# Patient Record
Sex: Male | Born: 1980
Health system: Southern US, Community
[De-identification: ages and names within clinical notes are randomized; demographics above are authoritative.]

## PROBLEM LIST (undated history)

## (undated) DIAGNOSIS — D869 Sarcoidosis, unspecified: Secondary | ICD-10-CM

## (undated) DIAGNOSIS — G373 Acute transverse myelitis in demyelinating disease of central nervous system: Secondary | ICD-10-CM

## (undated) DIAGNOSIS — J40 Bronchitis, not specified as acute or chronic: Secondary | ICD-10-CM

## (undated) DIAGNOSIS — K219 Gastro-esophageal reflux disease without esophagitis: Secondary | ICD-10-CM

## (undated) HISTORY — DX: Acute transverse myelitis in demyelinating disease of central nervous system: G37.3

## (undated) HISTORY — PX: NO PAST SURGERIES: SHX2092

---

## 2002-12-17 ENCOUNTER — Emergency Department (HOSPITAL_COMMUNITY): Admission: EM | Admit: 2002-12-17 | Discharge: 2002-12-17 | Payer: Self-pay | Admitting: Emergency Medicine

## 2005-08-24 ENCOUNTER — Emergency Department (HOSPITAL_COMMUNITY): Admission: EM | Admit: 2005-08-24 | Discharge: 2005-08-24 | Payer: Self-pay | Admitting: Family Medicine

## 2005-10-01 ENCOUNTER — Emergency Department (HOSPITAL_COMMUNITY): Admission: EM | Admit: 2005-10-01 | Discharge: 2005-10-01 | Payer: Self-pay | Admitting: Emergency Medicine

## 2011-10-24 ENCOUNTER — Encounter: Payer: Self-pay | Admitting: Emergency Medicine

## 2011-10-24 ENCOUNTER — Emergency Department (HOSPITAL_COMMUNITY)
Admission: EM | Admit: 2011-10-24 | Discharge: 2011-10-25 | Disposition: A | Discharge status: Home / Self Care | Payer: Self-pay | Attending: Emergency Medicine | Admitting: Emergency Medicine

## 2011-10-24 DIAGNOSIS — M62838 Other muscle spasm: Secondary | ICD-10-CM | POA: Insufficient documentation

## 2011-10-24 DIAGNOSIS — R0602 Shortness of breath: Secondary | ICD-10-CM | POA: Insufficient documentation

## 2011-10-24 DIAGNOSIS — R062 Wheezing: Secondary | ICD-10-CM | POA: Insufficient documentation

## 2011-10-24 DIAGNOSIS — R51 Headache: Secondary | ICD-10-CM | POA: Insufficient documentation

## 2011-10-24 NOTE — ED Notes (Signed)
Pt states today he started having pain that shoots up the back of his head  Intermittently throughout the day  Pt states tonight he started having shortness of breath and felt gittery  Pt states now he continues to have intermittent pains in his head, the shortness of breath has improved but is having a sharp pain that runs up his left side as well

## 2011-10-25 LAB — POCT I-STAT, CHEM 8
BUN: 10 mg/dL (ref 6–23)
Calcium, Ion: 1.15 mmol/L (ref 1.12–1.32)
Chloride: 103 mEq/L (ref 96–112)
Creatinine, Ser: 1.3 mg/dL (ref 0.50–1.35)
HCT: 44 % (ref 39.0–52.0)
Potassium: 3.5 mEq/L (ref 3.5–5.1)
Sodium: 140 mEq/L (ref 135–145)
TCO2: 26 mmol/L (ref 0–100)

## 2011-10-25 MED ORDER — CYCLOBENZAPRINE HCL 10 MG PO TABS: 10.0000 mg | ORAL_TABLET | Freq: Three times a day (TID) | ORAL | Status: AC | PRN

## 2011-10-25 NOTE — ED Provider Notes (Signed)
History     CSN: [id number] Arrival date & time: 10/24/2011 11:21 PM   First MD Initiated Contact with Patient 10/25/11 0251      Chief Complaint  Patient presents with  . Headache    HPI  Hx provided by the pt.  Pt is a 30 yo male with hx of asthma who presents with complaints of intermittent rt posterior neck pains throughout the day today.  Pain is described as quick and brief sharp electrical like pains that radiate up to posterior scalp area.  Pain can come at any time and he does not reports any aggravating factors.  Pt denies any injury or strenuous activity.  Pt also reports feeling some SOB earlier and was not sure if symptoms were related.  Pt used his inhaler and reports feeling better with no SOB now.  He continues to have occasional neck pains.  Pt denies any fever, chills, sweats, sore throat, cough, CP, or N/V.  Pt has no other significant PMH.   Past Medical History  Diagnosis Date  . Asthma     History reviewed. No pertinent past surgical history.  Family History  Problem Relation Age of Onset  . Diabetes Other     History  Substance Use Topics  . Smoking status: Never Smoker   . Smokeless tobacco: Not on file  . Alcohol Use: Yes     occassional      Review of Systems  Constitutional: Negative for fever and chills.  Respiratory: Positive for shortness of breath and wheezing. Negative for cough.   Cardiovascular: Negative for chest pain.  Gastrointestinal: Negative for nausea, vomiting, abdominal pain and diarrhea.  All other systems reviewed and are negative.    Allergies  Review of patient's allergies indicates no known allergies.  Home Medications   Current Outpatient Rx  Name Route Sig Dispense Refill  . ALBUTEROL SULFATE HFA 108 (90 BASE) MCG/ACT IN AERS Inhalation Inhale 2 puffs into the lungs every 6 (six) hours as needed. For asthma     . CARBOXYMETHYLCELLULOSE SODIUM 0.5 % OP SOLN  1 drop 3 (three) times daily as needed. For eye  lubrication in both eyes     . LORATADINE-PSEUDOEPHEDRINE ER 10-240 MG PO TB24 Oral Take 1 tablet by mouth daily.      Marland Kitchen SALINE NASAL SPRAY 0.65 % NA SOLN Nasal Place 1 spray into the nose as needed. For nasal congestion in both nostrils        BP 146/88  Pulse 76  Temp(Src) 98.6 F (37 C) (Oral)  Resp 18  SpO2 100%  Physical Exam  Nursing note and vitals reviewed. Constitutional: He is oriented to person, place, and time. He appears well-developed and well-nourished. No distress.  HENT:  Head: Normocephalic.  Neck: Normal range of motion. Neck supple. No tracheal deviation present.       No crepitus, deformity.  Mild TTP over rt trapezius and paraspinal area.  Cardiovascular: Normal rate, regular rhythm and normal heart sounds.   No murmur heard. Pulmonary/Chest: Effort normal and breath sounds normal. No respiratory distress. He has no wheezes. He has no rales.  Abdominal: Soft.  Musculoskeletal: Normal range of motion. He exhibits no edema and no tenderness.  Lymphadenopathy:    He has no cervical adenopathy.  Neurological: He is alert and oriented to person, place, and time. He has normal strength. No cranial nerve deficit or sensory deficit. Coordination and gait normal.  Skin: Skin is warm. No rash noted. No erythema.  Psychiatric: He has a normal mood and affect. His behavior is normal.    ED Course  Procedures (including critical care time)  Labs Reviewed  POCT I-STAT, CHEM 8 - Abnormal; Notable for the following:    Glucose, Bld 112 (*)    All other components within normal limits  I-STAT, CHEM 8   Results for orders placed during the hospital encounter of 10/24/11  POCT I-STAT, CHEM 8      Component Value Range   Sodium 140  135 - 145 (mEq/L)   Potassium 3.5  3.5 - 5.1 (mEq/L)   Chloride 103  96 - 112 (mEq/L)   BUN 10  6 - 23 (mg/dL)   Creatinine, Ser 4.09  0.50 - 1.35 (mg/dL)   Glucose, Bld 811 (*) 70 - 99 (mg/dL)   Calcium, Ion 9.14  7.82 - 1.32 (mmol/L)     TCO2 26  0 - 100 (mmol/L)   Hemoglobin 15.0  13.0 - 17.0 (g/dL)   HCT 95.6  21.3 - 08.6 (%)      1. Muscle spasm       MDM  Pt seen and evaluated.  Pt in no acute distress. Pt is well appearing.        Angus Seller, [place] 10/25/11 [phone]

## 2011-10-25 NOTE — ED Notes (Signed)
Pt alert, c/o h/a, onset today, speech clear, ambulates to room, denies n/v or emesis

## 2011-10-26 NOTE — ED Provider Notes (Signed)
Medical screening examination/treatment/procedure(s) were performed by non-physician practitioner and as supervising physician I was immediately available for consultation/collaboration.   Vida Roller, MD 10/26/11 [phone]

## 2014-07-31 ENCOUNTER — Emergency Department (HOSPITAL_COMMUNITY)
Admission: EM | Admit: 2014-07-31 | Discharge: 2014-08-01 | Disposition: A | Discharge status: Home / Self Care | Payer: 59 | Attending: Emergency Medicine | Admitting: Emergency Medicine

## 2014-07-31 ENCOUNTER — Encounter (HOSPITAL_COMMUNITY): Payer: Self-pay | Admitting: Emergency Medicine

## 2014-07-31 DIAGNOSIS — J45909 Unspecified asthma, uncomplicated: Secondary | ICD-10-CM | POA: Insufficient documentation

## 2014-07-31 DIAGNOSIS — M5412 Radiculopathy, cervical region: Secondary | ICD-10-CM

## 2014-07-31 DIAGNOSIS — Z791 Long term (current) use of non-steroidal anti-inflammatories (NSAID): Secondary | ICD-10-CM | POA: Insufficient documentation

## 2014-07-31 DIAGNOSIS — M545 Low back pain, unspecified: Secondary | ICD-10-CM | POA: Insufficient documentation

## 2014-07-31 DIAGNOSIS — Z79899 Other long term (current) drug therapy: Secondary | ICD-10-CM | POA: Diagnosis not present

## 2014-07-31 DIAGNOSIS — R209 Unspecified disturbances of skin sensation: Secondary | ICD-10-CM | POA: Insufficient documentation

## 2014-07-31 DIAGNOSIS — G8911 Acute pain due to trauma: Secondary | ICD-10-CM | POA: Diagnosis not present

## 2014-07-31 NOTE — ED Notes (Signed)
Pt arrived to the ED with a complaint of generalized body pain with associated bilateral leg numbness.  Pt states symptoms have been present for three months.  Pt states that he slipped in the tub and was prescribed medicine but tonight the medication was ineffective and he was unable to sleep

## 2014-08-01 ENCOUNTER — Emergency Department (HOSPITAL_COMMUNITY): Payer: 59

## 2014-08-01 LAB — I-STAT CHEM 8, ED
BUN: 18 mg/dL (ref 6–23)
Calcium, Ion: 1.22 mmol/L (ref 1.12–1.23)
Chloride: 103 mEq/L (ref 96–112)
Creatinine, Ser: 1.2 mg/dL (ref 0.50–1.35)
Glucose, Bld: 111 mg/dL — ABNORMAL HIGH (ref 70–99)
HEMATOCRIT: 45 % (ref 39.0–52.0)
Hemoglobin: 15.3 g/dL (ref 13.0–17.0)
Potassium: 4.1 mEq/L (ref 3.7–5.3)
Sodium: 138 mEq/L (ref 137–147)
TCO2: 27 mmol/L (ref 0–100)

## 2014-08-01 MED ORDER — HYDROCODONE-ACETAMINOPHEN 5-325 MG PO TABS
1.0000 | ORAL_TABLET | Freq: Once | ORAL | Status: AC
  Administered 2014-08-01: 1 via ORAL
  Filled 2014-08-01: qty 1

## 2014-08-01 MED ORDER — PREDNISONE 20 MG PO TABS: ORAL_TABLET | ORAL | Status: DC

## 2014-08-01 MED ORDER — METHOCARBAMOL 500 MG PO TABS
750.0000 mg | ORAL_TABLET | Freq: Once | ORAL | Status: DC
  Filled 2014-08-01: qty 2

## 2014-08-01 MED ORDER — METHOCARBAMOL 750 MG PO TABS: 750.0000 mg | ORAL_TABLET | Freq: Two times a day (BID) | ORAL | Status: DC

## 2014-08-01 MED ORDER — HYDROCODONE-ACETAMINOPHEN 5-325 MG PO TABS: 1.0000 | ORAL_TABLET | Freq: Four times a day (QID) | ORAL | Status: DC | PRN

## 2014-08-01 MED ORDER — PREDNISONE 20 MG PO TABS
60.0000 mg | ORAL_TABLET | Freq: Once | ORAL | Status: AC
  Administered 2014-08-01: 60 mg via ORAL
  Filled 2014-08-01: qty 3

## 2014-08-01 NOTE — ED Provider Notes (Signed)
CSN: [id number]     Arrival date & time 07/31/14  2337 History   First MD Initiated Contact with Patient 07/31/14 2358     Chief Complaint  Patient presents with  . Numbness     (Consider location/radiation/quality/duration/timing/severity/associated sxs/prior Treatment) HPI Comments: Patient states, approximately 2 months ago.  He fell in the shower stepping into the bathtub.  He twisted his his head and neck against the wall, in his lower back on the edge of the tub since that time.  He has been treated for muscle spasm with anti-inflammatories, steroids as well as muscle relaxers, which seemed to be working until several days ago, when he developed numbness and tingling to his bilateral hands, and a feeling of heaviness in his legs.  He, states is not having any trouble urinating.  He is having nocturnal erections.  He, states he is having bowel movements, but feels he has to strain it is not always aware that he is passing the stool.   The history is provided by the patient.    Past Medical History  Diagnosis Date  . Asthma    History reviewed. No pertinent past surgical history. Family History  Problem Relation Age of Onset  . Diabetes Other    History  Substance Use Topics  . Smoking status: Never Smoker   . Smokeless tobacco: Not on file  . Alcohol Use: Yes     Comment: occassional    Review of Systems  Constitutional: Negative for fever.  Respiratory: Negative for shortness of breath.   Gastrointestinal: Negative for abdominal pain.  Genitourinary: Negative for urgency, frequency and difficulty urinating.  Neurological: Positive for numbness. Negative for weakness.  All other systems reviewed and are negative.     Allergies  Review of patient's allergies indicates no known allergies.  Home Medications   Prior to Admission medications   Medication Sig Start Date End Date Taking? Authorizing Provider  meloxicam (MOBIC) 15 MG tablet Take 15 mg by mouth daily.    Yes Historical Provider, MD  methocarbamol (ROBAXIN) 500 MG tablet Take 500 mg by mouth every 8 (eight) hours as needed for muscle spasms.   Yes Historical Provider, MD  albuterol (PROVENTIL HFA;VENTOLIN HFA) 108 (90 BASE) MCG/ACT inhaler Inhale 2 puffs into the lungs every 6 (six) hours as needed. For asthma     Historical Provider, MD  HYDROcodone-acetaminophen (NORCO/VICODIN) 5-325 MG per tablet Take 1 tablet by mouth every 6 (six) hours as needed for moderate pain. 08/01/14   Garald Balding, NP  methocarbamol (ROBAXIN) 750 MG tablet Take 1 tablet (750 mg total) by mouth 2 (two) times daily. 08/01/14   Garald Balding, NP  predniSONE (DELTASONE) 20 MG tablet 3 Tabs PO Days 1-3, then 2 tabs PO Days 4-6, then 1 tab PO Day 7-9, then Half Tab PO Day 10-12 08/01/14   Garald Balding, NP   BP 167/92  Pulse 77  Temp(Src) 98.7 F (37.1 C) (Oral)  Resp 16  SpO2 100% Physical Exam  Nursing note and vitals reviewed. Constitutional: He is oriented to person, place, and time. He appears well-developed and well-nourished. No distress.  HENT:  Head: Normocephalic and atraumatic.  Eyes: Pupils are equal, round, and reactive to light.  Neck: Normal range of motion. Spinous process tenderness and muscular tenderness present. No rigidity. No erythema present.  Cardiovascular: Normal rate.   Pulmonary/Chest: Effort normal.  Abdominal: Soft. Bowel sounds are normal.  Genitourinary:     Musculoskeletal: Normal range of  motion. He exhibits no edema and no tenderness.       Back:  Neurological: He is alert and oriented to person, place, and time.  Skin: Skin is warm. No rash noted. No pallor.    ED Course  Procedures (including critical care time) Labs Review Labs Reviewed - No data to display  Imaging Review Ct Cervical Spine Wo Contrast  08/01/2014   CLINICAL DATA:  General pain and leg numbness for 3 months  EXAM: CT CERVICAL SPINE WITHOUT CONTRAST  TECHNIQUE: Multidetector CT imaging of the cervical  spine was performed without intravenous contrast. Multiplanar CT image reconstructions were also generated.  COMPARISON:  None.  FINDINGS: The alignment is anatomic. The vertebral body heights are maintained. There is no acute fracture. There is no static listhesis. The prevertebral soft tissues are normal. The intraspinal soft tissues are not fully imaged on this examination due to poor soft tissue contrast, but there is no gross soft tissue abnormality.  The disc spaces are maintained. There is a mild broad-based disc bulge at C3-4 and C4-5.  The visualized portions of the lung apices demonstrate no focal abnormality.  IMPRESSION: No acute osseous injury of the cervical spine.   Electronically Signed   By: Kathreen Devoid   On: 08/01/2014 00:55   Ct Lumbar Spine Wo Contrast  08/01/2014   CLINICAL DATA:  Low back pain and numbness  EXAM: CT LUMBAR SPINE WITHOUT CONTRAST  TECHNIQUE: Multidetector CT imaging of the lumbar spine was performed without intravenous contrast administration. Multiplanar CT image reconstructions were also generated.  COMPARISON:  None.  FINDINGS: The vertebral body heights are maintained. The alignment is anatomic. The paravertebral soft tissues are normal. There is no fracture or static listhesis. There is no spondylolysis. The disc spaces are preserved. The visualized portion of the SI joints are unremarkable.  T12-L1: There is no significant disc bulge, foraminal stenosis or central canal stenosis.  L1-L2: There is no significant disc bulge, foraminal stenosis or central canal stenosis.  L2-L3: There is no significant disc bulge, foraminal stenosis or central canal stenosis.  L3-L4: There is no significant disc bulge, foraminal stenosis or central canal stenosis.  L4-L5: There is no significant disc bulge, foraminal stenosis or central canal stenosis.  L5-S1: There is no significant disc bulge, foraminal stenosis or central canal stenosis.  IMPRESSION: Normal CT of the lumbar spine.    Electronically Signed   By: Kathreen Devoid   On: 08/01/2014 01:02     EKG Interpretation None     Will schedule patient for outpatient MRI with ortho fu  MDM   Final diagnoses:  Cervical radicular pain         Garald Balding, NP 08/01/14 0240

## 2014-08-01 NOTE — ED Notes (Signed)
Patient is alert and oriented x3.  He was given DC instructions and follow up visit instructions.  Patient gave verbal understanding.  He was DC ambulatory under his own power to home.  V/S stable.  He was not showing any signs of distress on DC 

## 2014-08-01 NOTE — ED Provider Notes (Signed)
Medical screening examination/treatment/procedure(s) were performed by non-physician practitioner and as supervising physician I was immediately available for consultation/collaboration.   EKG Interpretation None         Everlene Balls, MD 08/01/14 [phone]

## 2014-08-01 NOTE — Discharge Instructions (Signed)

## 2014-08-04 ENCOUNTER — Ambulatory Visit (HOSPITAL_BASED_OUTPATIENT_CLINIC_OR_DEPARTMENT_OTHER)
Admission: RE | Admit: 2014-08-04 | Discharge: 2014-08-04 | Disposition: A | Discharge status: Home / Self Care | Payer: 59 | Source: Ambulatory Visit | Attending: Emergency Medicine | Admitting: Emergency Medicine

## 2014-08-04 ENCOUNTER — Emergency Department (HOSPITAL_COMMUNITY): Payer: 59

## 2014-08-04 ENCOUNTER — Inpatient Hospital Stay (HOSPITAL_COMMUNITY)
Admission: EM | Admit: 2014-08-04 | Discharge: 2014-08-09 | DRG: 099 | Disposition: A | Discharge status: Home / Self Care | Payer: 59 | Attending: Internal Medicine | Admitting: Internal Medicine

## 2014-08-04 ENCOUNTER — Telehealth (HOSPITAL_BASED_OUTPATIENT_CLINIC_OR_DEPARTMENT_OTHER): Payer: Self-pay | Admitting: Emergency Medicine

## 2014-08-04 ENCOUNTER — Encounter (HOSPITAL_COMMUNITY): Payer: Self-pay | Admitting: Neurology

## 2014-08-04 DIAGNOSIS — G0491 Myelitis, unspecified: Secondary | ICD-10-CM | POA: Diagnosis present

## 2014-08-04 DIAGNOSIS — R590 Localized enlarged lymph nodes: Secondary | ICD-10-CM

## 2014-08-04 DIAGNOSIS — Z79899 Other long term (current) drug therapy: Secondary | ICD-10-CM | POA: Diagnosis not present

## 2014-08-04 DIAGNOSIS — M549 Dorsalgia, unspecified: Secondary | ICD-10-CM | POA: Diagnosis present

## 2014-08-04 DIAGNOSIS — R911 Solitary pulmonary nodule: Secondary | ICD-10-CM

## 2014-08-04 DIAGNOSIS — G0490 Encephalitis and encephalomyelitis, unspecified: Secondary | ICD-10-CM

## 2014-08-04 DIAGNOSIS — J45909 Unspecified asthma, uncomplicated: Secondary | ICD-10-CM

## 2014-08-04 DIAGNOSIS — G0489 Other myelitis: Secondary | ICD-10-CM

## 2014-08-04 DIAGNOSIS — R222 Localized swelling, mass and lump, trunk: Secondary | ICD-10-CM

## 2014-08-04 DIAGNOSIS — J45998 Other asthma: Secondary | ICD-10-CM | POA: Diagnosis present

## 2014-08-04 DIAGNOSIS — K5900 Constipation, unspecified: Secondary | ICD-10-CM | POA: Diagnosis not present

## 2014-08-04 DIAGNOSIS — Z23 Encounter for immunization: Secondary | ICD-10-CM

## 2014-08-04 DIAGNOSIS — G373 Acute transverse myelitis in demyelinating disease of central nervous system: Secondary | ICD-10-CM | POA: Insufficient documentation

## 2014-08-04 LAB — CBC WITH DIFFERENTIAL/PLATELET
BASOS PCT: 0 % (ref 0–1)
Basophils Absolute: 0 10*3/uL (ref 0.0–0.1)
Eosinophils Absolute: 0.1 10*3/uL (ref 0.0–0.7)
Eosinophils Relative: 1 % (ref 0–5)
HCT: 42 % (ref 39.0–52.0)
HEMOGLOBIN: 14.9 g/dL (ref 13.0–17.0)
Lymphocytes Relative: 24 % (ref 12–46)
Lymphs Abs: 1.5 10*3/uL (ref 0.7–4.0)
MCH: 28 pg (ref 26.0–34.0)
MCHC: 35.5 g/dL (ref 30.0–36.0)
MCV: 78.9 fL (ref 78.0–100.0)
Monocytes Absolute: 0.8 10*3/uL (ref 0.1–1.0)
Monocytes Relative: 14 % — ABNORMAL HIGH (ref 3–12)
Neutro Abs: 3.6 10*3/uL (ref 1.7–7.7)
Neutrophils Relative %: 61 % (ref 43–77)
Platelets: 236 10*3/uL (ref 150–400)
RBC: 5.32 MIL/uL (ref 4.22–5.81)
RDW: 14 % (ref 11.5–15.5)
WBC: 6 10*3/uL (ref 4.0–10.5)

## 2014-08-04 LAB — COMPREHENSIVE METABOLIC PANEL
ALBUMIN: 4.4 g/dL (ref 3.5–5.2)
ALT: 29 U/L (ref 0–53)
AST: 21 U/L (ref 0–37)
Alkaline Phosphatase: 71 U/L (ref 39–117)
Anion gap: 11 (ref 5–15)
BUN: 11 mg/dL (ref 6–23)
CO2: 27 mEq/L (ref 19–32)
Calcium: 10.1 mg/dL (ref 8.4–10.5)
Chloride: 102 mEq/L (ref 96–112)
Creatinine, Ser: 1.16 mg/dL (ref 0.50–1.35)
GFR calc Af Amer: >90 mL/min (ref >90)
GFR calc non Af Amer: 82 mL/min — ABNORMAL LOW (ref >90)
Glucose, Bld: 88 mg/dL (ref 70–99)
Potassium: 4.1 mEq/L (ref 3.7–5.3)
Sodium: 140 mEq/L (ref 137–147)
TOTAL PROTEIN: 8.3 g/dL (ref 6.0–8.3)
Total Bilirubin: 0.6 mg/dL (ref 0.3–1.2)

## 2014-08-04 LAB — SEDIMENTATION RATE: SED RATE: 12 mm/h (ref 0–16)

## 2014-08-04 LAB — RAPID HIV SCREEN (WH-MAU): Rapid HIV Screen: NONREACTIVE

## 2014-08-04 MED ORDER — SODIUM CHLORIDE 0.9 % IV SOLN
500.0000 mg | Freq: Two times a day (BID) | INTRAVENOUS | Status: DC
  Administered 2014-08-04 – 2014-08-06 (×4): 500 mg via INTRAVENOUS
  Filled 2014-08-04 (×6): qty 4

## 2014-08-04 MED ORDER — GADOBENATE DIMEGLUMINE 529 MG/ML IV SOLN
20.0000 mL | Freq: Once | INTRAVENOUS | Status: AC
  Administered 2014-08-04: 20 mL via INTRAVENOUS

## 2014-08-04 MED ORDER — ONDANSETRON HCL 4 MG/2ML IJ SOLN: 4.0000 mg | Freq: Four times a day (QID) | INTRAMUSCULAR | Status: DC | PRN

## 2014-08-04 MED ORDER — ACETAMINOPHEN 325 MG PO TABS: 650.0000 mg | ORAL_TABLET | Freq: Four times a day (QID) | ORAL | Status: DC | PRN

## 2014-08-04 MED ORDER — PANTOPRAZOLE SODIUM 40 MG IV SOLR
40.0000 mg | INTRAVENOUS | Status: DC
  Administered 2014-08-04 – 2014-08-05 (×2): 40 mg via INTRAVENOUS
  Filled 2014-08-04 (×2): qty 40

## 2014-08-04 MED ORDER — ALBUTEROL SULFATE (2.5 MG/3ML) 0.083% IN NEBU: 3.0000 mL | INHALATION_SOLUTION | Freq: Four times a day (QID) | RESPIRATORY_TRACT | Status: DC | PRN

## 2014-08-04 MED ORDER — ACETAMINOPHEN 650 MG RE SUPP: 650.0000 mg | Freq: Four times a day (QID) | RECTAL | Status: DC | PRN

## 2014-08-04 MED ORDER — ONDANSETRON HCL 4 MG PO TABS: 4.0000 mg | ORAL_TABLET | Freq: Four times a day (QID) | ORAL | Status: DC | PRN

## 2014-08-04 NOTE — ED Notes (Signed)
Spoke with Admit Doctor and will need [facility] with Neuro Risk manager notified.

## 2014-08-04 NOTE — ED Provider Notes (Signed)
Patient here following up results from MRI outpatient. MRI results show diffuse edema from C2-T8, recommending further imaging by MRI and neuro consult. Patient sent to fast track for these results, I do not believe patient fits criteria for fast track at this time and we will send to main ED. Patient alert, nontoxic, no acute distress.  Signed,  Dahlia Bailiff, PA-C 6:03 PM   Carrie Mew, PA-C 08/04/14 [phone]

## 2014-08-04 NOTE — ED Notes (Signed)
[place] sandwich and apple sauce given to patient.

## 2014-08-04 NOTE — ED Notes (Signed)
Had some numbness in feet and legs and had MRI this am and was called and told to come to ER for results he thinks they found something

## 2014-08-04 NOTE — ED Notes (Signed)
Pt reports falling in the bathtub 2 months ago; had intermittent numbness in bilateral legs since then, worsening over the past week. Pt also reports tingling to third thru fifth fingers on bilateral hands. Pt had an outpatient MRI scheduled for this morning, was called and told to come to ED. Denies any new complaints since mri

## 2014-08-04 NOTE — ED Notes (Signed)
Admit Doctor at bedside.  

## 2014-08-04 NOTE — ED Notes (Signed)
Phlebotomy at bedside.

## 2014-08-04 NOTE — ED Notes (Signed)
Neurology stated patient and eat and drink if passes stroke swallow screen.

## 2014-08-04 NOTE — Consult Note (Signed)
NEURO HOSPITALIST CONSULT NOTE    Reason for Consult: abnormal Cervical MRI  HPI:                                                                                                                                          Gregory Fuller is an 33 y.o. male who fell in the tube 2 months ago.  He noted he slipped and when he landed he landed on his neck.  He did not lose consciousness. A few days after his fall he noted both back and neck pain.  He also noted when he looked down he had a electrical shock like sen sensation that went down his back.  He seeked medical attention and was given a shot of steroids which seemed to help.  A few days later his pain returned and he went to urgent care where he was give a 10 mg steroid dose pack.  This seemed to help the pain, but again when he finished the dose pack his neck pain returned.  Patient obtained a MRI of cervical spine today which demonstrated abnormal cord signal from C2-T8.   Patient denies any blurred vision, diplopia, lack of strength, bowel or bladder incontinence, shortness of breath.    Past Medical History  Diagnosis Date  . Asthma     No past surgical history on file.  Family History  Problem Relation Age of Onset  . Diabetes Other   . Thyroid disease Mother      Social History:  reports that he has never smoked. He does not have any smokeless tobacco history on file. He reports that he drinks alcohol. He reports that he does not use illicit drugs.  Allergies  Allergen Reactions  . Vicodin [Hydrocodone-Acetaminophen] Other (See Comments)    Made patient feel like he was in more pain    MEDICATIONS:                                                                                                                     Current Facility-Administered Medications  Medication Dose Route Frequency Provider Last Rate Last Dose  . methylPREDNISolone sodium succinate (SOLU-MEDROL) 500 mg in sodium chloride 0.9 % 50 mL  IVPB  500 mg Intravenous Q12H Marliss Coots, PA-C      .  pantoprazole (PROTONIX) injection 40 mg  40 mg Intravenous Q24H Marliss Coots, PA-C       Current Outpatient Prescriptions  Medication Sig Dispense Refill  . albuterol (PROVENTIL HFA;VENTOLIN HFA) 108 (90 BASE) MCG/ACT inhaler Inhale 2 puffs into the lungs every 6 (six) hours as needed. For asthma       . methocarbamol (ROBAXIN) 500 MG tablet Take 250-500 mg by mouth 3 (three) times daily as needed for muscle spasms.      Marland Kitchen sulfacetamide (BLEPH-10) 10 % ophthalmic solution Place 2 drops into the left eye 4 (four) times daily.          ROS:                                                                                                                                       History obtained from the patient  General ROS: negative for - chills, fatigue, fever, night sweats, weight gain or weight loss Psychological ROS: negative for - behavioral disorder, hallucinations, memory difficulties, mood swings or suicidal ideation Ophthalmic ROS: negative for - blurry vision, double vision, eye pain or loss of vision ENT ROS: negative for - epistaxis, nasal discharge, oral lesions, sore throat, tinnitus or vertigo Allergy and Immunology ROS: negative for - hives or itchy/watery eyes Hematological and Lymphatic ROS: negative for - bleeding problems, bruising or swollen lymph nodes Endocrine ROS: negative for - galactorrhea, hair pattern changes, polydipsia/polyuria or temperature intolerance Respiratory ROS: negative for - cough, hemoptysis, shortness of breath or wheezing Cardiovascular ROS: negative for - chest pain, dyspnea on exertion, edema or irregular heartbeat Gastrointestinal ROS: negative for - abdominal pain, diarrhea, hematemesis, nausea/vomiting or stool incontinence Genito-Urinary ROS: negative for - dysuria, hematuria, incontinence or urinary frequency/urgency Musculoskeletal ROS: negative for - joint swelling or muscular  weakness Neurological ROS: as noted in HPI Dermatological ROS: negative for rash and skin lesion changes   Blood pressure 159/93, pulse 69, temperature 98.9 F (37.2 C), temperature source Oral, resp. rate 16, SpO2 100.00%.   Neurologic Examination:                                                                                                      General: NAD Mental Status: Alert, oriented, thought content appropriate.  Speech fluent without evidence of aphasia.  Able to follow 3 step commands without difficulty. Cranial Nerves: II: Discs flat bilaterally; Visual fields grossly normal, pupils equal, round, reactive to light and accommodation III,IV, VI: ptosis not present, extra-ocular  motions intact bilaterally V,VII: smile symmetric, facial light touch sensation normal bilaterally VIII: hearing normal bilaterally IX,X: gag reflex present XI: bilateral shoulder shrug XII: midline tongue extension without atrophy or fasciculations  Motor: Right : Upper extremity   5/5    Left:     Upper extremity   5/5  Lower extremity   5/5     Lower extremity   5/5 Tone and bulk:normal tone throughout; no atrophy noted Sensory: Pinprick and light touch decreased from T5-6 to feet  Deep Tendon Reflexes:  Right: Upper Extremity   Left: Upper extremity   biceps (C-5 to C-6) 2/4   biceps (C-5 to C-6) 2/4 tricep (C7) 2/4    triceps (C7) 2/4 Brachioradialis (C6) 2/4  Brachioradialis (C6) 2/4  Lower Extremity Lower Extremity  quadriceps (L-2 to L-4) 2/4   quadriceps (L-2 to L-4) 2/4 Achilles (S1) 2/4   Achilles (S1) 2/4  Plantars: Right: downgoing   Left: downgoing Cerebellar: normal finger-to-nose,  normal heel-to-shin test Gait: not tested.  CV: pulses palpable throughout    Lab Results: Basic Metabolic Panel:  Recent Labs Lab 08/01/14 0227  NA 138  K 4.1  CL 103  GLUCOSE 111*  BUN 18  CREATININE 1.20    Liver Function Tests: No results found for this basename: AST, ALT,  ALKPHOS, BILITOT, PROT, ALBUMIN,  in the last 168 hours No results found for this basename: LIPASE, AMYLASE,  in the last 168 hours No results found for this basename: AMMONIA,  in the last 168 hours  CBC:  Recent Labs Lab 08/01/14 0227 08/04/14 1627  WBC  --  6.0  NEUTROABS  --  3.6  HGB 15.3 14.9  HCT 45.0 42.0  MCV  --  78.9  PLT  --  236    Cardiac Enzymes: No results found for this basename: CKTOTAL, CKMB, CKMBINDEX, TROPONINI,  in the last 168 hours  Lipid Panel: No results found for this basename: CHOL, TRIG, HDL, CHOLHDL, VLDL, LDLCALC,  in the last 168 hours  CBG: No results found for this basename: GLUCAP,  in the last 168 hours  Microbiology: No results found for this or any previous visit.  Coagulation Studies: No results found for this basename: LABPROT, INR,  in the last 72 hours  Imaging: Mr Cervical Spine Wo Contrast  08/04/2014   CLINICAL DATA:  Neck pain with weakness and numbness in both legs, left greater than right. Fall in bathtub July, 2015.  EXAM: MRI CERVICAL SPINE WITHOUT CONTRAST  TECHNIQUE: Multiplanar, multisequence MR imaging of the cervical spine was performed. No intravenous contrast was administered.  COMPARISON:  08/01/2014 CT scan  FINDINGS: Prominent symmetric cord expansion and edema signal extending from the C2 level down through at least the T8 level is observed, with corresponding low T1 signal in this large segment of the cord. No middle or lower upon teen abnormal signal is observed.  The craniocervical junction appears unremarkable. No vertebral subluxation is observed. No significant vertebral marrow edema is identified. Additional findings at individual levels are as follows:  C2-3:  Unremarkable.  C3-4: Mild disc bulge slightly indents the anterior cord but only because the cord is expanded.  C4-5: Mild disc bulge indents the anterior cord but only because the cord is expanded.  C5-6: Mild disc bulge indents the anterior cord but only  because the cord is expanded.  C6-7: Small central disc protrusion indents the anterior cord but only because the cord is expanded.  C7-T1:  Unremarkable.  We  obtained a limited due view of the upper thoracic spine on sagittal images which included down to T8 on T2 weighted images. Please be aware that this does not constitute a diagnostic evaluation of the thoracic spine and only gets a brief and unreliable look at the upper thoracic spine. This does appear to show the cord edema signal/expansion extending down at least to the T8 level.  IMPRESSION: 1. Prominent abnormal edema signal and expansion of the spinal cord extending from the C2 level at least down to the T8 level. Differential diagnostic considerations include HIV myelitis, postviral/post vaccination autoimmune myelitis, Devic's disease, or demyelinating process. Tumor seems unlikely given the extended involvement. Further imaging workup is recommended, to include MRI of the brain with and without contrast; MRI thoracic spine with and without contrast; and post-contrast images of the cervical spine for further characterization. Neurology consultation is also recommended. I have contacted by telephone the flow managers associated with the [facility] emergency room in order to track down and contact the patient and have the patient immediately proceed to the [facility] Emergency Room for further neurologic assessment and imaging workup.   Electronically Signed   By: Sherryl Barters M.D.   On: 08/04/2014 13:03       Assessment and plan per attending neurologist  Etta Quill PA-C [facility] [phone]  08/04/2014, 5:07 PM   Assessment/Plan: 33 year old male with myelitis of unclear etiology. Differential diagnosis includes multiple sclerosis, neuromyelitis optica, infectious myelitis, autoimmune disease, metabolic myelopathy. The longitudinally extensive nature of this   1) NMO IgG from serum 2) autoimmune evaluation  with ANA, serum ACE, SSA, SSB, ena, anca, rf 3) CSF evaluation for cell count and differential, protein, glucose, oligoclonal bands, IgG index. I would perform this after contrasted MRI brain 4) consider infectious evaluation with CSF PCR for HSV-1, HSV-2, HHV-6, VZV, CMV, EBV, enteroviruses if the patient were to spike a fever, develop meningismus, or have other signs of infectious etiology clinically or on other workup 4) HIV RNA, if immune compromised then further infectious workup 5) MRI brain with/without contrast, MRI c-spine w/contrast, MRI T-spine w/wo contrast.  6) Solumedrol 500mg  BID    Roland Rack, MD [facility] [phone]  If 7pm- 7am, please page neurology on call as listed in [place].

## 2014-08-04 NOTE — ED Notes (Signed)
Cardiology at bedside.

## 2014-08-04 NOTE — Telephone Encounter (Signed)
attempt to contact patient regarding abnormal MRI, per Radiologist, patient needs to return to [facility] ED for eval. left message to call flow managers #

## 2014-08-04 NOTE — Telephone Encounter (Signed)
patient returned call. Informed of abnormal MRI and need to come to [facility] ED today for evaluation. Patient verbalized understanding and report to [facility]  today.

## 2014-08-04 NOTE — ED Provider Notes (Signed)
CSN: [id number]     Arrival date & time 08/04/14  1433 History   First MD Initiated Contact with Patient 08/04/14 1545     Chief Complaint  Patient presents with  . Back Pain     (Consider location/radiation/quality/duration/timing/severity/associated sxs/prior Treatment) The history is provided by the patient.   patient had a fall in the bathtub about mid July with the hyperflexion of his neck and head. 2 weeks later he started numbness in his toes and that spread up within a sending type numbness now at the level of the the umbilicus. Also with numbness around his perianal area. No motor deficits at all. No visual changes no headache did have some neck pain. Patient's had courses of steroids on and off which seemed to improve somewhat he would get bad and then when he was sought done with those things would get worse again. Patient was seen at [facility] emergency department a few days ago. Patient had MRI scheduled for Monday "done to today. I was MRI of the cervical area. It showed abnormalities with swelling of the spinal cord. Patient referred in here for further evaluation. Neurology will be consult.  Past Medical History  Diagnosis Date  . Asthma    No past surgical history on file. Family History  Problem Relation Age of Onset  . Diabetes Other   . Thyroid disease Mother    History  Substance Use Topics  . Smoking status: Never Smoker   . Smokeless tobacco: Not on file  . Alcohol Use: Yes     Comment: occassional    Review of Systems  Constitutional: Negative for fever.  HENT: Negative for congestion.   Eyes: Negative for visual disturbance.  Respiratory: Negative for shortness of breath.   Cardiovascular: Negative for chest pain.  Gastrointestinal: Negative for nausea, vomiting and abdominal pain.  Genitourinary: Negative for dysuria.  Musculoskeletal: Positive for neck pain. Negative for back pain.  Skin: Negative for rash.  Neurological: Positive for numbness.  Negative for syncope, facial asymmetry, speech difficulty, weakness and headaches.  Hematological: Does not bruise/bleed easily.  Psychiatric/Behavioral: Negative for confusion.      Allergies  Vicodin  Home Medications   Prior to Admission medications   Medication Sig Start Date End Date Taking? Authorizing Provider  albuterol (PROVENTIL HFA;VENTOLIN HFA) 108 (90 BASE) MCG/ACT inhaler Inhale 2 puffs into the lungs every 6 (six) hours as needed. For asthma    Yes Historical Provider, MD  methocarbamol (ROBAXIN) 500 MG tablet Take 250-500 mg by mouth 3 (three) times daily as needed for muscle spasms.   Yes Historical Provider, MD  sulfacetamide (BLEPH-10) 10 % ophthalmic solution Place 2 drops into the left eye 4 (four) times daily.   Yes Historical Provider, MD   BP 143/87  Pulse 67  Temp(Src) 99 F (37.2 C) (Oral)  Resp 16  SpO2 99% Physical Exam  Nursing note and vitals reviewed. Constitutional: He is oriented to person, place, and time. He appears well-developed and well-nourished. No distress.  HENT:  Head: Normocephalic and atraumatic.  Mouth/Throat: Oropharynx is clear and moist.  Eyes: Conjunctivae and EOM are normal. Pupils are equal, round, and reactive to light.  Neck: Normal range of motion.  Cardiovascular: Normal rate, regular rhythm and normal heart sounds.   Pulmonary/Chest: Effort normal and breath sounds normal. No respiratory distress.  Abdominal: Soft. Bowel sounds are normal. There is no tenderness.  Musculoskeletal: Normal range of motion. He exhibits no edema.  Neurological: He is alert and  oriented to person, place, and time. No cranial nerve deficit. He exhibits normal muscle tone. Coordination normal.  Isolate her sensory deficit in the form of numbness toes up to about periumbilical area.  Skin: Skin is warm. No rash noted.    ED Course  Procedures (including critical care time) Labs Review Labs Reviewed  CBC WITH DIFFERENTIAL - Abnormal; Notable  for the following:    Monocytes Relative 14 (*)    All other components within normal limits  COMPREHENSIVE METABOLIC PANEL - Abnormal; Notable for the following:    GFR calc non Af Amer 82 (*)    All other components within normal limits  RAPID HIV SCREEN ([facility])  URINALYSIS, ROUTINE W REFLEX MICROSCOPIC  ANA  NEUROMYELITIS OPTICA AUTOAB, IGG  ANGIOTENSIN CONVERTING ENZYME  RHEUMATOID FACTOR  EXTRACTABLE NUCLEAR ANTIGEN ANTIBODY  ANCA SCREEN W REFLEX TITER  MPO/PR-3 (ANCA) ANTIBODIES  HIV-1 RNA, QUALITATIVE, TMA  SEDIMENTATION RATE  C-REACTIVE PROTEIN  VITAMIN E    Imaging Review Dg Chest 2 View  08/04/2014   CLINICAL DATA:  Abnormal C-spine MRI. Differential considerations include sarcoidosis.  EXAM: CHEST  2 VIEW  COMPARISON:  None.  FINDINGS: Lungs are essentially clear. No focal consolidation. No pleural effusion or pneumothorax.  The heart is normal in size.  Mild prominence of the bilateral pulmonary hila. Adenopathy is possible. Right paratracheal stripe is not enlarged.  Visualized osseous structures are within normal limits.  IMPRESSION: Mild prominence of the bilateral pulmonary hila, possibly reflecting hilar adenopathy, although equivocal.  If further imaging evaluation is desired, consider CT chest with contrast.  Otherwise, no evidence of acute cardiopulmonary disease.   Electronically Signed   By: Julian Hy M.D.   On: 08/04/2014 18:10   Mr Jeri Cos TG Contrast  08/04/2014   CLINICAL DATA:  Abnormal MRI of the cervical spine. Fall 2 months ago. Neck injury. Back and neck pain. Positive Lhermitte's sign.  EXAM: MRI HEAD WITHOUT AND WITH CONTRAST  MRI CERVICAL SPINE WITH CONTRAST  MRI THORACIC SPINE WITHOUT AND WITH CONTRAST  TECHNIQUE: Multiplanar, multiecho pulse sequences of the brain and surrounding structures, and cervical and thoracic spine, to include the craniocervical junction , were obtained without and with intravenous contrast.  CONTRAST:  79mL MULTIHANCE  GADOBENATE DIMEGLUMINE 529 MG/ML IV SOLN  COMPARISON:  MRI cervical spine from the same day.  FINDINGS: MRI HEAD FINDINGS  A focal area of subcortical T2 hyperintensity is present in the posterior right frontal lobe. Abnormal signal is present along the genu of the corpus callosum, left greater than right.  There is associated enhancement along the posterior genu of the corpus callosum left greater than right. No other significant focal enhancement is present.  There is subtle diffuse periventricular white matter change bilaterally without significant associated enhancement. Mild dural enhancement is present throughout, most prominent over the left tentorium.  Flow is present in the major intracranial arteries. The globes and orbits are intact. Mild mucosal thickening is present in the left frontal sinus in the anterior ethmoid air cells bilaterally. The mastoid air cells are clear.  The brain is otherwise unremarkable. No acute infarct, hemorrhage, or mass lesion is present. The ventricles are of normal size.  MRI CERVICAL SPINE FINDINGS  The postcontrast images of the cervical spine demonstrate symmetric lateral enhancement from the craniocervical junction to the C1-2 level. There is asymmetric left-sided posterior lateral dural enhancement at the level of C4. Diffuse dorsal enhancement is evident from C5 through T2. This involves both the dorsal  aspect of the cord and the dura. There is no pathologic enhancement of the vertebral bodies.  MRI THORACIC SPINE FINDINGS  There is diffuse cord signal abnormality in some expansion through the entire thoracic cord to the level of the conus medullaris which terminates at T12. Marrow signal, vertebral body heights, alignment are normal. Segment focal areas of dorsal cord in dural enhancement are present from T4 through T7 and again most notably from T9-10 through T11-12. There is some degree of diffuse dural enhancement throughout the thoracic spinal cord. Portions of the  dorsal cord enhance as well.  There is no focal disc protrusion or central canal stenosis. The foramina are widely patent bilaterally.  IMPRESSION: 1. The findings are nonspecific but suggestive of ADEM. Given the extensive segmental areas of dural enhancement, an infectious myelitis must be considered. Neuromyelitis optica is considered. Without other etiology, this would be considered idiopathic acute transverse myelitis. Multiple sclerosis is considered less likely. This does not appear to be a neoplastic process. 2. Subtle T2 changes in the brain are less prominent than in the thoracic spine and likely related to the same process. 3. Mild diffuse dural enhancement is present over the brain and spinal cord. 4. Focal enhancement is present along the up minimal surface of the left lateral ventricle as described. These results were called by telephone at the time of interpretation on 08/04/2014 at 8:51 pm to Dr. Wallie Char, who verbally acknowledged these results.   Electronically Signed   By: Lawrence Santiago M.D.   On: 08/04/2014 20:52   Mr Cervical Spine Wo Contrast  08/04/2014   CLINICAL DATA:  Neck pain with weakness and numbness in both legs, left greater than right. Fall in bathtub July, 2015.  EXAM: MRI CERVICAL SPINE WITHOUT CONTRAST  TECHNIQUE: Multiplanar, multisequence MR imaging of the cervical spine was performed. No intravenous contrast was administered.  COMPARISON:  08/01/2014 CT scan  FINDINGS: Prominent symmetric cord expansion and edema signal extending from the C2 level down through at least the T8 level is observed, with corresponding low T1 signal in this large segment of the cord. No middle or lower upon teen abnormal signal is observed.  The craniocervical junction appears unremarkable. No vertebral subluxation is observed. No significant vertebral marrow edema is identified. Additional findings at individual levels are as follows:  C2-3:  Unremarkable.  C3-4: Mild disc bulge slightly  indents the anterior cord but only because the cord is expanded.  C4-5: Mild disc bulge indents the anterior cord but only because the cord is expanded.  C5-6: Mild disc bulge indents the anterior cord but only because the cord is expanded.  C6-7: Small central disc protrusion indents the anterior cord but only because the cord is expanded.  C7-T1:  Unremarkable.  We obtained a limited due view of the upper thoracic spine on sagittal images which included down to T8 on T2 weighted images. Please be aware that this does not constitute a diagnostic evaluation of the thoracic spine and only gets a brief and unreliable look at the upper thoracic spine. This does appear to show the cord edema signal/expansion extending down at least to the T8 level.  IMPRESSION: 1. Prominent abnormal edema signal and expansion of the spinal cord extending from the C2 level at least down to the T8 level. Differential diagnostic considerations include HIV myelitis, postviral/post vaccination autoimmune myelitis, Devic's disease, or demyelinating process. Tumor seems unlikely given the extended involvement. Further imaging workup is recommended, to include MRI of the brain  with and without contrast; MRI thoracic spine with and without contrast; and post-contrast images of the cervical spine for further characterization. Neurology consultation is also recommended. I have contacted by telephone the flow managers associated with the [facility] emergency room in order to track down and contact the patient and have the patient immediately proceed to the [facility] Emergency Room for further neurologic assessment and imaging workup.   Electronically Signed   By: Sherryl Barters M.D.   On: 08/04/2014 13:03   Mr Cervical Spine W Contrast  08/04/2014   CLINICAL DATA:  Abnormal MRI of the cervical spine. Fall 2 months ago. Neck injury. Back and neck pain. Positive Lhermitte's sign.  EXAM: MRI HEAD WITHOUT AND WITH CONTRAST  MRI CERVICAL  SPINE WITH CONTRAST  MRI THORACIC SPINE WITHOUT AND WITH CONTRAST  TECHNIQUE: Multiplanar, multiecho pulse sequences of the brain and surrounding structures, and cervical and thoracic spine, to include the craniocervical junction , were obtained without and with intravenous contrast.  CONTRAST:  68mL MULTIHANCE GADOBENATE DIMEGLUMINE 529 MG/ML IV SOLN  COMPARISON:  MRI cervical spine from the same day.  FINDINGS: MRI HEAD FINDINGS  A focal area of subcortical T2 hyperintensity is present in the posterior right frontal lobe. Abnormal signal is present along the genu of the corpus callosum, left greater than right.  There is associated enhancement along the posterior genu of the corpus callosum left greater than right. No other significant focal enhancement is present.  There is subtle diffuse periventricular white matter change bilaterally without significant associated enhancement. Mild dural enhancement is present throughout, most prominent over the left tentorium.  Flow is present in the major intracranial arteries. The globes and orbits are intact. Mild mucosal thickening is present in the left frontal sinus in the anterior ethmoid air cells bilaterally. The mastoid air cells are clear.  The brain is otherwise unremarkable. No acute infarct, hemorrhage, or mass lesion is present. The ventricles are of normal size.  MRI CERVICAL SPINE FINDINGS  The postcontrast images of the cervical spine demonstrate symmetric lateral enhancement from the craniocervical junction to the C1-2 level. There is asymmetric left-sided posterior lateral dural enhancement at the level of C4. Diffuse dorsal enhancement is evident from C5 through T2. This involves both the dorsal aspect of the cord and the dura. There is no pathologic enhancement of the vertebral bodies.  MRI THORACIC SPINE FINDINGS  There is diffuse cord signal abnormality in some expansion through the entire thoracic cord to the level of the conus medullaris which  terminates at T12. Marrow signal, vertebral body heights, alignment are normal. Segment focal areas of dorsal cord in dural enhancement are present from T4 through T7 and again most notably from T9-10 through T11-12. There is some degree of diffuse dural enhancement throughout the thoracic spinal cord. Portions of the dorsal cord enhance as well.  There is no focal disc protrusion or central canal stenosis. The foramina are widely patent bilaterally.  IMPRESSION: 1. The findings are nonspecific but suggestive of ADEM. Given the extensive segmental areas of dural enhancement, an infectious myelitis must be considered. Neuromyelitis optica is considered. Without other etiology, this would be considered idiopathic acute transverse myelitis. Multiple sclerosis is considered less likely. This does not appear to be a neoplastic process. 2. Subtle T2 changes in the brain are less prominent than in the thoracic spine and likely related to the same process. 3. Mild diffuse dural enhancement is present over the brain and spinal cord. 4. Focal enhancement  is present along the up minimal surface of the left lateral ventricle as described. These results were called by telephone at the time of interpretation on 08/04/2014 at 8:51 pm to Dr. Wallie Char, who verbally acknowledged these results.   Electronically Signed   By: Lawrence Santiago M.D.   On: 08/04/2014 20:52   Mr Thoracic Spine W Wo Contrast  08/04/2014   CLINICAL DATA:  Abnormal MRI of the cervical spine. Fall 2 months ago. Neck injury. Back and neck pain. Positive Lhermitte's sign.  EXAM: MRI HEAD WITHOUT AND WITH CONTRAST  MRI CERVICAL SPINE WITH CONTRAST  MRI THORACIC SPINE WITHOUT AND WITH CONTRAST  TECHNIQUE: Multiplanar, multiecho pulse sequences of the brain and surrounding structures, and cervical and thoracic spine, to include the craniocervical junction , were obtained without and with intravenous contrast.  CONTRAST:  33mL MULTIHANCE GADOBENATE DIMEGLUMINE  529 MG/ML IV SOLN  COMPARISON:  MRI cervical spine from the same day.  FINDINGS: MRI HEAD FINDINGS  A focal area of subcortical T2 hyperintensity is present in the posterior right frontal lobe. Abnormal signal is present along the genu of the corpus callosum, left greater than right.  There is associated enhancement along the posterior genu of the corpus callosum left greater than right. No other significant focal enhancement is present.  There is subtle diffuse periventricular white matter change bilaterally without significant associated enhancement. Mild dural enhancement is present throughout, most prominent over the left tentorium.  Flow is present in the major intracranial arteries. The globes and orbits are intact. Mild mucosal thickening is present in the left frontal sinus in the anterior ethmoid air cells bilaterally. The mastoid air cells are clear.  The brain is otherwise unremarkable. No acute infarct, hemorrhage, or mass lesion is present. The ventricles are of normal size.  MRI CERVICAL SPINE FINDINGS  The postcontrast images of the cervical spine demonstrate symmetric lateral enhancement from the craniocervical junction to the C1-2 level. There is asymmetric left-sided posterior lateral dural enhancement at the level of C4. Diffuse dorsal enhancement is evident from C5 through T2. This involves both the dorsal aspect of the cord and the dura. There is no pathologic enhancement of the vertebral bodies.  MRI THORACIC SPINE FINDINGS  There is diffuse cord signal abnormality in some expansion through the entire thoracic cord to the level of the conus medullaris which terminates at T12. Marrow signal, vertebral body heights, alignment are normal. Segment focal areas of dorsal cord in dural enhancement are present from T4 through T7 and again most notably from T9-10 through T11-12. There is some degree of diffuse dural enhancement throughout the thoracic spinal cord. Portions of the dorsal cord enhance as  well.  There is no focal disc protrusion or central canal stenosis. The foramina are widely patent bilaterally.  IMPRESSION: 1. The findings are nonspecific but suggestive of ADEM. Given the extensive segmental areas of dural enhancement, an infectious myelitis must be considered. Neuromyelitis optica is considered. Without other etiology, this would be considered idiopathic acute transverse myelitis. Multiple sclerosis is considered less likely. This does not appear to be a neoplastic process. 2. Subtle T2 changes in the brain are less prominent than in the thoracic spine and likely related to the same process. 3. Mild diffuse dural enhancement is present over the brain and spinal cord. 4. Focal enhancement is present along the up minimal surface of the left lateral ventricle as described. These results were called by telephone at the time of interpretation on 08/04/2014 at 8:51 pm  to Dr. Wallie Char, who verbally acknowledged these results.   Electronically Signed   By: Lawrence Santiago M.D.   On: 08/04/2014 20:52     EKG Interpretation None      MDM   Final diagnoses:  Transverse myelitis    Neurology to see in consultation. As as well as additional MR send an order of the brain and of the thoracic vertebrae area.. Interesting findings. Patient's symptoms are predominantly sensory numbness off from the waist down but did start is in a standing type is sensory deficit starting in the toes and moving up to the waist. Symptoms started towards the end of July beginning of August patient thought it had something to do with a fall in the bathtub in the middle of July. Patient does have some difficulty starting urine stream and with defecating. No problems with incontinence. Patient does feel non-around the perianal area as well. Denies any motor deficits. Denies any visual deficits headaches a cranial nerve deficits or any upper extremity deficits.  MRI of the brain highly suggestive for her of  findings consistent with multiple sclerosis.  Fredia Sorrow, MD 08/04/14 2127

## 2014-08-04 NOTE — H&P (Signed)
[facility] History and Physical  HERSHAL ERIKSSON OFB:[id number] DOB: Sep 15, 1981 DOA: 08/04/2014  Referring physician: ER physician. PCP: No PCP Per Patient   Chief Complaint: Neck and back pain.  HPI: RONDEL EPISCOPO is a 33 y.o. male with history of bronchial asthma started experiencing neck and back pain with lower extremity numbness after he had a fall in July of this year. Patient had gone to urgent care center as outpatient when he was given steroids following which his symptoms improved but returned back after the steroid taper. Patient had gone to [facility] and over there patient was placed for outpatient MRI which showed abnormal again patient was referred to the ER. In the ER patient was evaluated by Dr. Leonel Ramsay neurologist on-call and at this time given patient's features in the MRI spine MRI brain neurologist is concerned about myelitis and patient has been started on Solu-Medrol. Patient will be admitted for further management. Patient denies any headache visual symptoms any weakness of the lower extremities or any incontinence of bowel or urine. Denies any chest pain or shortness of breath fever chills. Denies any recent travel outside the [place] or recent vaccination.   Review of Systems: As presented in the history of presenting illness, rest negative.  Past Medical History  Diagnosis Date  . Asthma    Past Surgical History  Procedure Laterality Date  . No past surgeries     Social History:  reports that he has never smoked. He does not have any smokeless tobacco history on file. He reports that he drinks alcohol. He reports that he does not use illicit drugs. Where does patient live home. Can patient participate in ADLs? Yes.  Allergies  Allergen Reactions  . Vicodin [Hydrocodone-Acetaminophen] Other (See Comments)    Made patient feel like he was in more pain    Family History:  Family History  Problem Relation Age of Onset  . Diabetes  Other   . Thyroid disease Mother       Prior to Admission medications   Medication Sig Start Date End Date Taking? Authorizing Provider  albuterol (PROVENTIL HFA;VENTOLIN HFA) 108 (90 BASE) MCG/ACT inhaler Inhale 2 puffs into the lungs every 6 (six) hours as needed. For asthma    Yes Historical Provider, MD  methocarbamol (ROBAXIN) 500 MG tablet Take 250-500 mg by mouth 3 (three) times daily as needed for muscle spasms.   Yes Historical Provider, MD  sulfacetamide (BLEPH-10) 10 % ophthalmic solution Place 2 drops into the left eye 4 (four) times daily.   Yes Historical Provider, MD    Physical Exam: Filed Vitals:   08/04/14 2100 08/04/14 2111 08/04/14 2130 08/04/14 2230  BP: 155/86  150/81 132/65  Pulse: 82  94 84  Temp:  99 F (37.2 C)    TempSrc:      Resp: 16  16 16   SpO2: 99%  100% 99%     General:  Well-developed well-nourished.  Eyes: Anicteric no pallor.  ENT: No discharge from ears eyes nose mouth.  Neck: No mass felt.  Cardiovascular: S1-S2 heard.  Respiratory: No rhonchi or crepitations.  Abdomen: Soft nontender bowel sounds present. No guarding or rigidity.  Skin: No rash.  Musculoskeletal: No edema.  Psychiatric: Appears normal.  Neurologic: Alert and oriented to time place and person. Moves all extremities 5 x 5. No facial asymmetry. Tongue is midline.  Labs on Admission:  Basic Metabolic Panel:  Recent Labs Lab 08/01/14 0227 08/04/14 1627  NA 138 140  K 4.1 4.1  CL 103 102  CO2  --  27  GLUCOSE 111* 88  BUN 18 11  CREATININE 1.20 1.16  CALCIUM  --  10.1   Liver Function Tests:  Recent Labs Lab 08/04/14 1627  AST 21  ALT 29  ALKPHOS 71  BILITOT 0.6  PROT 8.3  ALBUMIN 4.4   No results found for this basename: LIPASE, AMYLASE,  in the last 168 hours No results found for this basename: AMMONIA,  in the last 168 hours CBC:  Recent Labs Lab 08/01/14 0227 08/04/14 1627  WBC  --  6.0  NEUTROABS  --  3.6  HGB 15.3 14.9  HCT  45.0 42.0  MCV  --  78.9  PLT  --  236   Cardiac Enzymes: No results found for this basename: CKTOTAL, CKMB, CKMBINDEX, TROPONINI,  in the last 168 hours  BNP (last 3 results) No results found for this basename: PROBNP,  in the last 8760 hours CBG: No results found for this basename: GLUCAP,  in the last 168 hours  Radiological Exams on Admission: Dg Chest 2 View  08/04/2014   CLINICAL DATA:  Abnormal C-spine MRI. Differential considerations include sarcoidosis.  EXAM: CHEST  2 VIEW  COMPARISON:  None.  FINDINGS: Lungs are essentially clear. No focal consolidation. No pleural effusion or pneumothorax.  The heart is normal in size.  Mild prominence of the bilateral pulmonary hila. Adenopathy is possible. Right paratracheal stripe is not enlarged.  Visualized osseous structures are within normal limits.  IMPRESSION: Mild prominence of the bilateral pulmonary hila, possibly reflecting hilar adenopathy, although equivocal.  If further imaging evaluation is desired, consider CT chest with contrast.  Otherwise, no evidence of acute cardiopulmonary disease.   Electronically Signed   By: Julian Hy M.D.   On: 08/04/2014 18:10   Mr Jeri Cos YJ Contrast  08/04/2014   CLINICAL DATA:  Abnormal MRI of the cervical spine. Fall 2 months ago. Neck injury. Back and neck pain. Positive Lhermitte's sign.  EXAM: MRI HEAD WITHOUT AND WITH CONTRAST  MRI CERVICAL SPINE WITH CONTRAST  MRI THORACIC SPINE WITHOUT AND WITH CONTRAST  TECHNIQUE: Multiplanar, multiecho pulse sequences of the brain and surrounding structures, and cervical and thoracic spine, to include the craniocervical junction , were obtained without and with intravenous contrast.  CONTRAST:  30mL MULTIHANCE GADOBENATE DIMEGLUMINE 529 MG/ML IV SOLN  COMPARISON:  MRI cervical spine from the same day.  FINDINGS: MRI HEAD FINDINGS  A focal area of subcortical T2 hyperintensity is present in the posterior right frontal lobe. Abnormal signal is present along  the genu of the corpus callosum, left greater than right.  There is associated enhancement along the posterior genu of the corpus callosum left greater than right. No other significant focal enhancement is present.  There is subtle diffuse periventricular white matter change bilaterally without significant associated enhancement. Mild dural enhancement is present throughout, most prominent over the left tentorium.  Flow is present in the major intracranial arteries. The globes and orbits are intact. Mild mucosal thickening is present in the left frontal sinus in the anterior ethmoid air cells bilaterally. The mastoid air cells are clear.  The brain is otherwise unremarkable. No acute infarct, hemorrhage, or mass lesion is present. The ventricles are of normal size.  MRI CERVICAL SPINE FINDINGS  The postcontrast images of the cervical spine demonstrate symmetric lateral enhancement from the craniocervical junction to the C1-2 level. There is asymmetric left-sided posterior lateral dural enhancement at the  level of C4. Diffuse dorsal enhancement is evident from C5 through T2. This involves both the dorsal aspect of the cord and the dura. There is no pathologic enhancement of the vertebral bodies.  MRI THORACIC SPINE FINDINGS  There is diffuse cord signal abnormality in some expansion through the entire thoracic cord to the level of the conus medullaris which terminates at T12. Marrow signal, vertebral body heights, alignment are normal. Segment focal areas of dorsal cord in dural enhancement are present from T4 through T7 and again most notably from T9-10 through T11-12. There is some degree of diffuse dural enhancement throughout the thoracic spinal cord. Portions of the dorsal cord enhance as well.  There is no focal disc protrusion or central canal stenosis. The foramina are widely patent bilaterally.  IMPRESSION: 1. The findings are nonspecific but suggestive of ADEM. Given the extensive segmental areas of dural  enhancement, an infectious myelitis must be considered. Neuromyelitis optica is considered. Without other etiology, this would be considered idiopathic acute transverse myelitis. Multiple sclerosis is considered less likely. This does not appear to be a neoplastic process. 2. Subtle T2 changes in the brain are less prominent than in the thoracic spine and likely related to the same process. 3. Mild diffuse dural enhancement is present over the brain and spinal cord. 4. Focal enhancement is present along the up minimal surface of the left lateral ventricle as described. These results were called by telephone at the time of interpretation on 08/04/2014 at 8:51 pm to Dr. Wallie Char, who verbally acknowledged these results.   Electronically Signed   By: Lawrence Santiago M.D.   On: 08/04/2014 20:52   Mr Cervical Spine Wo Contrast  08/04/2014   CLINICAL DATA:  Neck pain with weakness and numbness in both legs, left greater than right. Fall in bathtub July, 2015.  EXAM: MRI CERVICAL SPINE WITHOUT CONTRAST  TECHNIQUE: Multiplanar, multisequence MR imaging of the cervical spine was performed. No intravenous contrast was administered.  COMPARISON:  08/01/2014 CT scan  FINDINGS: Prominent symmetric cord expansion and edema signal extending from the C2 level down through at least the T8 level is observed, with corresponding low T1 signal in this large segment of the cord. No middle or lower upon teen abnormal signal is observed.  The craniocervical junction appears unremarkable. No vertebral subluxation is observed. No significant vertebral marrow edema is identified. Additional findings at individual levels are as follows:  C2-3:  Unremarkable.  C3-4: Mild disc bulge slightly indents the anterior cord but only because the cord is expanded.  C4-5: Mild disc bulge indents the anterior cord but only because the cord is expanded.  C5-6: Mild disc bulge indents the anterior cord but only because the cord is expanded.  C6-7:  Small central disc protrusion indents the anterior cord but only because the cord is expanded.  C7-T1:  Unremarkable.  We obtained a limited due view of the upper thoracic spine on sagittal images which included down to T8 on T2 weighted images. Please be aware that this does not constitute a diagnostic evaluation of the thoracic spine and only gets a brief and unreliable look at the upper thoracic spine. This does appear to show the cord edema signal/expansion extending down at least to the T8 level.  IMPRESSION: 1. Prominent abnormal edema signal and expansion of the spinal cord extending from the C2 level at least down to the T8 level. Differential diagnostic considerations include HIV myelitis, postviral/post vaccination autoimmune myelitis, Devic's disease, or demyelinating process. Tumor  seems unlikely given the extended involvement. Further imaging workup is recommended, to include MRI of the brain with and without contrast; MRI thoracic spine with and without contrast; and post-contrast images of the cervical spine for further characterization. Neurology consultation is also recommended. I have contacted by telephone the flow managers associated with the [facility] emergency room in order to track down and contact the patient and have the patient immediately proceed to the [facility] Emergency Room for further neurologic assessment and imaging workup.   Electronically Signed   By: Sherryl Barters M.D.   On: 08/04/2014 13:03   Mr Cervical Spine W Contrast  08/04/2014   CLINICAL DATA:  Abnormal MRI of the cervical spine. Fall 2 months ago. Neck injury. Back and neck pain. Positive Lhermitte's sign.  EXAM: MRI HEAD WITHOUT AND WITH CONTRAST  MRI CERVICAL SPINE WITH CONTRAST  MRI THORACIC SPINE WITHOUT AND WITH CONTRAST  TECHNIQUE: Multiplanar, multiecho pulse sequences of the brain and surrounding structures, and cervical and thoracic spine, to include the craniocervical junction , were obtained  without and with intravenous contrast.  CONTRAST:  53mL MULTIHANCE GADOBENATE DIMEGLUMINE 529 MG/ML IV SOLN  COMPARISON:  MRI cervical spine from the same day.  FINDINGS: MRI HEAD FINDINGS  A focal area of subcortical T2 hyperintensity is present in the posterior right frontal lobe. Abnormal signal is present along the genu of the corpus callosum, left greater than right.  There is associated enhancement along the posterior genu of the corpus callosum left greater than right. No other significant focal enhancement is present.  There is subtle diffuse periventricular white matter change bilaterally without significant associated enhancement. Mild dural enhancement is present throughout, most prominent over the left tentorium.  Flow is present in the major intracranial arteries. The globes and orbits are intact. Mild mucosal thickening is present in the left frontal sinus in the anterior ethmoid air cells bilaterally. The mastoid air cells are clear.  The brain is otherwise unremarkable. No acute infarct, hemorrhage, or mass lesion is present. The ventricles are of normal size.  MRI CERVICAL SPINE FINDINGS  The postcontrast images of the cervical spine demonstrate symmetric lateral enhancement from the craniocervical junction to the C1-2 level. There is asymmetric left-sided posterior lateral dural enhancement at the level of C4. Diffuse dorsal enhancement is evident from C5 through T2. This involves both the dorsal aspect of the cord and the dura. There is no pathologic enhancement of the vertebral bodies.  MRI THORACIC SPINE FINDINGS  There is diffuse cord signal abnormality in some expansion through the entire thoracic cord to the level of the conus medullaris which terminates at T12. Marrow signal, vertebral body heights, alignment are normal. Segment focal areas of dorsal cord in dural enhancement are present from T4 through T7 and again most notably from T9-10 through T11-12. There is some degree of diffuse  dural enhancement throughout the thoracic spinal cord. Portions of the dorsal cord enhance as well.  There is no focal disc protrusion or central canal stenosis. The foramina are widely patent bilaterally.  IMPRESSION: 1. The findings are nonspecific but suggestive of ADEM. Given the extensive segmental areas of dural enhancement, an infectious myelitis must be considered. Neuromyelitis optica is considered. Without other etiology, this would be considered idiopathic acute transverse myelitis. Multiple sclerosis is considered less likely. This does not appear to be a neoplastic process. 2. Subtle T2 changes in the brain are less prominent than in the thoracic spine and likely related to the same  process. 3. Mild diffuse dural enhancement is present over the brain and spinal cord. 4. Focal enhancement is present along the up minimal surface of the left lateral ventricle as described. These results were called by telephone at the time of interpretation on 08/04/2014 at 8:51 pm to Dr. Wallie Char, who verbally acknowledged these results.   Electronically Signed   By: Lawrence Santiago M.D.   On: 08/04/2014 20:52   Mr Thoracic Spine W Wo Contrast  08/04/2014   CLINICAL DATA:  Abnormal MRI of the cervical spine. Fall 2 months ago. Neck injury. Back and neck pain. Positive Lhermitte's sign.  EXAM: MRI HEAD WITHOUT AND WITH CONTRAST  MRI CERVICAL SPINE WITH CONTRAST  MRI THORACIC SPINE WITHOUT AND WITH CONTRAST  TECHNIQUE: Multiplanar, multiecho pulse sequences of the brain and surrounding structures, and cervical and thoracic spine, to include the craniocervical junction , were obtained without and with intravenous contrast.  CONTRAST:  21mL MULTIHANCE GADOBENATE DIMEGLUMINE 529 MG/ML IV SOLN  COMPARISON:  MRI cervical spine from the same day.  FINDINGS: MRI HEAD FINDINGS  A focal area of subcortical T2 hyperintensity is present in the posterior right frontal lobe. Abnormal signal is present along the genu of the  corpus callosum, left greater than right.  There is associated enhancement along the posterior genu of the corpus callosum left greater than right. No other significant focal enhancement is present.  There is subtle diffuse periventricular white matter change bilaterally without significant associated enhancement. Mild dural enhancement is present throughout, most prominent over the left tentorium.  Flow is present in the major intracranial arteries. The globes and orbits are intact. Mild mucosal thickening is present in the left frontal sinus in the anterior ethmoid air cells bilaterally. The mastoid air cells are clear.  The brain is otherwise unremarkable. No acute infarct, hemorrhage, or mass lesion is present. The ventricles are of normal size.  MRI CERVICAL SPINE FINDINGS  The postcontrast images of the cervical spine demonstrate symmetric lateral enhancement from the craniocervical junction to the C1-2 level. There is asymmetric left-sided posterior lateral dural enhancement at the level of C4. Diffuse dorsal enhancement is evident from C5 through T2. This involves both the dorsal aspect of the cord and the dura. There is no pathologic enhancement of the vertebral bodies.  MRI THORACIC SPINE FINDINGS  There is diffuse cord signal abnormality in some expansion through the entire thoracic cord to the level of the conus medullaris which terminates at T12. Marrow signal, vertebral body heights, alignment are normal. Segment focal areas of dorsal cord in dural enhancement are present from T4 through T7 and again most notably from T9-10 through T11-12. There is some degree of diffuse dural enhancement throughout the thoracic spinal cord. Portions of the dorsal cord enhance as well.  There is no focal disc protrusion or central canal stenosis. The foramina are widely patent bilaterally.  IMPRESSION: 1. The findings are nonspecific but suggestive of ADEM. Given the extensive segmental areas of dural enhancement, an  infectious myelitis must be considered. Neuromyelitis optica is considered. Without other etiology, this would be considered idiopathic acute transverse myelitis. Multiple sclerosis is considered less likely. This does not appear to be a neoplastic process. 2. Subtle T2 changes in the brain are less prominent than in the thoracic spine and likely related to the same process. 3. Mild diffuse dural enhancement is present over the brain and spinal cord. 4. Focal enhancement is present along the up minimal surface of the left lateral ventricle as  described. These results were called by telephone at the time of interpretation on 08/04/2014 at 8:51 pm to Dr. Wallie Char, who verbally acknowledged these results.   Electronically Signed   By: Lawrence Santiago M.D.   On: 08/04/2014 20:52     Assessment/Plan Principal Problem:   Myelitis   1. Myelitis - please refer to neurologist Dr. Cecil Cobbs consult note for complete details of plan and differential diagnosis and various tests ordered. At this time patient has been started on IV Solu-Medrol 500 mg twice a day for 3 days. Closely observe with neurochecks. Further recommendations per neurologist. 2. Asthma - presently not wheezing. When necessary albuterol.    Code Status: Full code.  Family Communication: Patient's family at the bedside.  Disposition Plan: Admit to inpatient.    Kariana Wiles N. [facility] Pager [phone].  If 7PM-7AM, please contact night-coverage [email or web address] Password [email or web address] 08/04/2014, 11:11 PM

## 2014-08-05 ENCOUNTER — Inpatient Hospital Stay (HOSPITAL_COMMUNITY): Payer: 59

## 2014-08-05 DIAGNOSIS — J45909 Unspecified asthma, uncomplicated: Secondary | ICD-10-CM

## 2014-08-05 DIAGNOSIS — R599 Enlarged lymph nodes, unspecified: Secondary | ICD-10-CM

## 2014-08-05 DIAGNOSIS — G0489 Other myelitis: Secondary | ICD-10-CM

## 2014-08-05 DIAGNOSIS — R222 Localized swelling, mass and lump, trunk: Secondary | ICD-10-CM

## 2014-08-05 DIAGNOSIS — R911 Solitary pulmonary nodule: Secondary | ICD-10-CM

## 2014-08-05 DIAGNOSIS — R590 Localized enlarged lymph nodes: Secondary | ICD-10-CM

## 2014-08-05 LAB — COMPREHENSIVE METABOLIC PANEL
ALT: 25 U/L (ref 0–53)
ANION GAP: 13 (ref 5–15)
AST: 17 U/L (ref 0–37)
Albumin: 4.3 g/dL (ref 3.5–5.2)
Alkaline Phosphatase: 69 U/L (ref 39–117)
BUN: 11 mg/dL (ref 6–23)
CALCIUM: 10 mg/dL (ref 8.4–10.5)
CO2: 23 mEq/L (ref 19–32)
CREATININE: 1.04 mg/dL (ref 0.50–1.35)
Chloride: 101 mEq/L (ref 96–112)
GLUCOSE: 134 mg/dL — AB (ref 70–99)
Potassium: 4.4 mEq/L (ref 3.7–5.3)
Sodium: 137 mEq/L (ref 137–147)
TOTAL PROTEIN: 8.4 g/dL — AB (ref 6.0–8.3)
Total Bilirubin: 0.8 mg/dL (ref 0.3–1.2)

## 2014-08-05 LAB — EXTRACTABLE NUCLEAR ANTIGEN ANTIBODY
ENA SM Ab Ser-aCnc: <1
SM/RNP: NEGATIVE
SSA (Ro) (ENA) Antibody, IgG: <1
SSB (La) (ENA) Antibody, IgG: <1
Scleroderma (Scl-70) (ENA) Antibody, IgG: <1
ds DNA Ab: 1 IU/mL

## 2014-08-05 LAB — CBC WITH DIFFERENTIAL/PLATELET
Basophils Absolute: 0 10*3/uL (ref 0.0–0.1)
Basophils Relative: 0 % (ref 0–1)
EOS ABS: 0 10*3/uL (ref 0.0–0.7)
Eosinophils Relative: 0 % (ref 0–5)
HCT: 42.4 % (ref 39.0–52.0)
Hemoglobin: 14.9 g/dL (ref 13.0–17.0)
LYMPHS PCT: 11 % — AB (ref 12–46)
Lymphs Abs: 0.7 10*3/uL (ref 0.7–4.0)
MCH: 27.1 pg (ref 26.0–34.0)
MCHC: 35.1 g/dL (ref 30.0–36.0)
MCV: 77.1 fL — ABNORMAL LOW (ref 78.0–100.0)
Monocytes Absolute: 0 10*3/uL — ABNORMAL LOW (ref 0.1–1.0)
Monocytes Relative: 1 % — ABNORMAL LOW (ref 3–12)
NEUTROS ABS: 5.6 10*3/uL (ref 1.7–7.7)
Neutrophils Relative %: 88 % — ABNORMAL HIGH (ref 43–77)
PLATELETS: 236 10*3/uL (ref 150–400)
RBC: 5.5 MIL/uL (ref 4.22–5.81)
RDW: 13.8 % (ref 11.5–15.5)
WBC: 6.3 10*3/uL (ref 4.0–10.5)

## 2014-08-05 LAB — RHEUMATOID FACTOR: Rhuematoid fact SerPl-aCnc: <10 IU/mL (ref <=14)

## 2014-08-05 LAB — ANTI-NUCLEAR AB-TITER (ANA TITER): ANA Titer 1: NEGATIVE

## 2014-08-05 LAB — URINALYSIS, ROUTINE W REFLEX MICROSCOPIC
Bilirubin Urine: NEGATIVE
Glucose, UA: NEGATIVE mg/dL
Hgb urine dipstick: NEGATIVE
Ketones, ur: NEGATIVE mg/dL
Leukocytes, UA: NEGATIVE
Nitrite: NEGATIVE
PH: 5.5 (ref 5.0–8.0)
Protein, ur: NEGATIVE mg/dL
Specific Gravity, Urine: 1.014 (ref 1.005–1.030)
UROBILINOGEN UA: 0.2 mg/dL (ref 0.0–1.0)

## 2014-08-05 LAB — HIV ANTIBODY (ROUTINE TESTING W REFLEX): HIV 1&2 Ab, 4th Generation: NONREACTIVE

## 2014-08-05 LAB — MPO/PR-3 (ANCA) ANTIBODIES: Serine Protease 3: <1

## 2014-08-05 LAB — ANGIOTENSIN CONVERTING ENZYME: ANGIOTENSIN-CONVERTING ENZYME: 16 U/L (ref 8–52)

## 2014-08-05 LAB — ANA: Anti Nuclear Antibody(ANA): POSITIVE — AB

## 2014-08-05 LAB — RPR

## 2014-08-05 LAB — C-REACTIVE PROTEIN: CRP: <0.5 mg/dL — ABNORMAL LOW (ref <0.60)

## 2014-08-05 MED ORDER — POLYETHYLENE GLYCOL 3350 17 G PO PACK
17.0000 g | PACK | Freq: Every day | ORAL | Status: DC
  Administered 2014-08-05 – 2014-08-09 (×5): 17 g via ORAL
  Filled 2014-08-05 (×4): qty 1

## 2014-08-05 MED ORDER — IOHEXOL 300 MG/ML  SOLN
80.0000 mL | Freq: Once | INTRAMUSCULAR | Status: AC | PRN
  Administered 2014-08-05: 80 mL via INTRAVENOUS

## 2014-08-05 NOTE — Progress Notes (Signed)
UR complete.  Krysta Bloomfield RN, MSN 

## 2014-08-05 NOTE — Progress Notes (Addendum)
[facility] PROGRESS NOTE  HODARI CHUBA TIR:[id number] DOB: [birth year] DOA: 08/04/2014 PCP: No PCP Per Patient  Assessment/Plan: Principal Problem:   Myelitis    Myelitis Differential diagnosis includes multiple sclerosis, neuromyelitis optica, infectious myelitis, autoimmune disease, metabolic myelopathy.  Extensive workup ordered by neurology Lumbar puncture MRI brain with/without contrast, MRI c-spine w/contrast, MRI T-spine w/wo contrast findings suggest ADEM Solumedrol 500mg  BID per neurology infectious evaluation with CSF PCR for HSV-1, HSV-2, HHV-6, VZV, CMV, EBV, enteroviruses  CRP negative ANA positive Rheumatoid factor negative HIV nonreactive  Hilar adenopathy Pathologic right hilar and infrahilar adenopathy  Question sarcoidosis,non-small cell lung cancer with dural metastatic disease and paraneoplastic myelitis  Consult pulmonary for possible bronchoscopy???    Code Status: full Family Communication: family updated about patient's clinical progress Disposition Plan:  Per neurology   Brief narrative: VINT POLA is an 33 y.o. male who fell in the tube 2 months ago. He noted he slipped and when he landed he landed on his neck. He did not lose consciousness. A few days after his fall he noted both back and neck pain. He also noted when he looked down he had a electrical shock like sen sensation that went down his back. He seeked medical attention and was given a shot of steroids which seemed to help. A few days later his pain returned and he went to urgent care where he was give a 10 mg steroid dose pack. This seemed to help the pain, but again when he finished the dose pack his neck pain returned. Patient obtained a MRI of cervical spine today which demonstrated abnormal cord signal from C2-T8.    Consultants: Neurology  Procedures:  Number puncture  Antibiotics:  None  HPI/Subjective: Patient denies any blurred vision, diplopia, lack of  strength, bowel or bladder incontinence   Objective: Filed Vitals:   08/04/14 2230 08/04/14 2300 08/04/14 2347 08/05/14 0550  BP: 132/65 140/74 150/87 145/81  Pulse: 84 87 81 80  Temp:   98.3 F (36.8 C) 97.8 F (36.6 C)  TempSrc:   Oral Oral  Resp: 16 16 16 16   Height:   5\' 6"  (1.676 m)   Weight:   92.761 kg (204 lb 8 oz)   SpO2: 99% 98% 99% 100%   No intake or output data in the 24 hours ending 08/05/14 1113  Exam:  General: alert & oriented x 3 In NAD  Cardiovascular: RRR, nl S1 s2  Respiratory: Decreased breath sounds at the bases, scattered rhonchi, no crackles  Abdomen: soft +BS NT/ND, no masses palpable  Extremities: No cyanosis and no edema Motor:  Right : Upper extremity 5/5 Left: Upper extremity 5/5  Lower extremity 5/5 Lower extremity 5/5      Data Reviewed: Basic Metabolic Panel:  Recent Labs Lab 08/01/14 0227 08/04/14 1627 08/05/14 0730  NA 138 140 137  K 4.1 4.1 4.4  CL 103 102 101  CO2  --  27 23  GLUCOSE 111* 88 134*  BUN 18 11 11   CREATININE 1.20 1.16 1.04  CALCIUM  --  10.1 10.0    Liver Function Tests:  Recent Labs Lab 08/04/14 1627 08/05/14 0730  AST 21 17  ALT 29 25  ALKPHOS 71 69  BILITOT 0.6 0.8  PROT 8.3 8.4*  ALBUMIN 4.4 4.3   No results found for this basename: LIPASE, AMYLASE,  in the last 168 hours No results found for this basename: AMMONIA,  in the last 168 hours  CBC:  Recent Labs  Lab 08/01/14 0227 08/04/14 1627 08/05/14 0730  WBC  --  6.0 6.3  NEUTROABS  --  3.6 5.6  HGB 15.3 14.9 14.9  HCT 45.0 42.0 42.4  MCV  --  78.9 77.1*  PLT  --  236 236    Cardiac Enzymes: No results found for this basename: CKTOTAL, CKMB, CKMBINDEX, TROPONINI,  in the last 168 hours BNP (last 3 results) No results found for this basename: PROBNP,  in the last 8760 hours   CBG: No results found for this basename: GLUCAP,  in the last 168 hours  No results found for this or any previous visit (from the past 240 hour(s)).    Studies: Dg Chest 2 View  08/04/2014   CLINICAL DATA:  Abnormal C-spine MRI. Differential considerations include sarcoidosis.  EXAM: CHEST  2 VIEW  COMPARISON:  None.  FINDINGS: Lungs are essentially clear. No focal consolidation. No pleural effusion or pneumothorax.  The heart is normal in size.  Mild prominence of the bilateral pulmonary hila. Adenopathy is possible. Right paratracheal stripe is not enlarged.  Visualized osseous structures are within normal limits.  IMPRESSION: Mild prominence of the bilateral pulmonary hila, possibly reflecting hilar adenopathy, although equivocal.  If further imaging evaluation is desired, consider CT chest with contrast.  Otherwise, no evidence of acute cardiopulmonary disease.   Electronically Signed   By: Julian Hy M.D.   On: 08/04/2014 18:10   Ct Chest W Contrast  08/05/2014   CLINICAL DATA:  Hilar prominence on chest radiates atrophy, query adenopathy. Query sarcoidosis. The patient has a encephalomyelitis based on narrow imaging.  EXAM: CT CHEST WITH CONTRAST  TECHNIQUE: Multidetector CT imaging of the chest was performed during intravenous contrast administration.  CONTRAST:  60mL OMNIPAQUE IOHEXOL 300 MG/ML  SOLN  COMPARISON:  Multiple exams, including 08/04/2014  FINDINGS: CT confirms right hilar and infrahilar adenopathy, with a right posterior hilar node measuring 1.7 cm in short axis and a right infrahilar node measuring 1.6 cm in short axis on images 25 and 27, respectively. Additional enlarged infrahilar and hilar nodes are present on the right side. No adenopathy on the left or in the remainder the mediastinum. No axillary or internal mammary adenopathy. No definite regional endobronchial lesion or significant extrinsic narrowing of the airways.  Peripherally in the left lower lobe there is a pleural based slightly irregularly marginated lesion on image 43 of series 3 measuring 1.4 cm at its pleural base and 0.7 cm in height. Adjacent to this is a  flat but slightly irregular 2.8 by 0.5 cm subpleural lesion. There is a 4 mm left lower lobe nodule on image 35 of series 3 which appears noncalcified.  No definite nodular airway thickening.  IMPRESSION: 1. Pathologic right hilar and infrahilar adenopathy but without left hilar or mediastinal adenopathy, and with slightly irregular pleural based lesions medially in the right lower lobe in this patient with known segmental regions of dural enhancement and myelitis throughout the spinal cord and in portions of the brain. A unifying diagnosis is problematic as the imaging appearance is not considered highly characteristic for a specific disease process. The unilateral hilar adenopathy and lack of nodular airway thickening would be unusual for sarcoidosis with neurosarcoidosis as the underlying cause, but it would also be unusual for this to represent a non-small cell lung cancer with dural metastatic disease and paraneoplastic myelitis and cerebritis given the patient's young age. Correlation with serum ACE levels suggested in working up the sarcoidosis angle although these are  not entirely specific. Bronchoscopic biopsy of the lymph nodes may provide useful diagnostic information. With regard to the pleural-based lesions, I am uncertain whether these are true lesions or simply represent regional atelectasis. There is an adjacent 4 mm nodule in the right lower lobe, too small to biopsy percutaneously. Entities such as right-sided tuberculosis with TB involvement of the derived likewise seem unlikely.   Electronically Signed   By: Sherryl Barters M.D.   On: 08/05/2014 10:44   Ct Cervical Spine Wo Contrast  08/01/2014   CLINICAL DATA:  General pain and leg numbness for 3 months  EXAM: CT CERVICAL SPINE WITHOUT CONTRAST  TECHNIQUE: Multidetector CT imaging of the cervical spine was performed without intravenous contrast. Multiplanar CT image reconstructions were also generated.  COMPARISON:  None.  FINDINGS: The  alignment is anatomic. The vertebral body heights are maintained. There is no acute fracture. There is no static listhesis. The prevertebral soft tissues are normal. The intraspinal soft tissues are not fully imaged on this examination due to poor soft tissue contrast, but there is no gross soft tissue abnormality.  The disc spaces are maintained. There is a mild broad-based disc bulge at C3-4 and C4-5.  The visualized portions of the lung apices demonstrate no focal abnormality.  IMPRESSION: No acute osseous injury of the cervical spine.   Electronically Signed   By: Kathreen Devoid   On: 08/01/2014 00:55   Ct Lumbar Spine Wo Contrast  08/01/2014   CLINICAL DATA:  Low back pain and numbness  EXAM: CT LUMBAR SPINE WITHOUT CONTRAST  TECHNIQUE: Multidetector CT imaging of the lumbar spine was performed without intravenous contrast administration. Multiplanar CT image reconstructions were also generated.  COMPARISON:  None.  FINDINGS: The vertebral body heights are maintained. The alignment is anatomic. The paravertebral soft tissues are normal. There is no fracture or static listhesis. There is no spondylolysis. The disc spaces are preserved. The visualized portion of the SI joints are unremarkable.  T12-L1: There is no significant disc bulge, foraminal stenosis or central canal stenosis.  L1-L2: There is no significant disc bulge, foraminal stenosis or central canal stenosis.  L2-L3: There is no significant disc bulge, foraminal stenosis or central canal stenosis.  L3-L4: There is no significant disc bulge, foraminal stenosis or central canal stenosis.  L4-L5: There is no significant disc bulge, foraminal stenosis or central canal stenosis.  L5-S1: There is no significant disc bulge, foraminal stenosis or central canal stenosis.  IMPRESSION: Normal CT of the lumbar spine.   Electronically Signed   By: Kathreen Devoid   On: 08/01/2014 01:02   Mr Brain W Wo Contrast  08/04/2014   CLINICAL DATA:  Abnormal MRI of the  cervical spine. Fall 2 months ago. Neck injury. Back and neck pain. Positive Lhermitte's sign.  EXAM: MRI HEAD WITHOUT AND WITH CONTRAST  MRI CERVICAL SPINE WITH CONTRAST  MRI THORACIC SPINE WITHOUT AND WITH CONTRAST  TECHNIQUE: Multiplanar, multiecho pulse sequences of the brain and surrounding structures, and cervical and thoracic spine, to include the craniocervical junction , were obtained without and with intravenous contrast.  CONTRAST:  34mL MULTIHANCE GADOBENATE DIMEGLUMINE 529 MG/ML IV SOLN  COMPARISON:  MRI cervical spine from the same day.  FINDINGS: MRI HEAD FINDINGS  A focal area of subcortical T2 hyperintensity is present in the posterior right frontal lobe. Abnormal signal is present along the genu of the corpus callosum, left greater than right.  There is associated enhancement along the posterior genu of the corpus callosum  left greater than right. No other significant focal enhancement is present.  There is subtle diffuse periventricular white matter change bilaterally without significant associated enhancement. Mild dural enhancement is present throughout, most prominent over the left tentorium.  Flow is present in the major intracranial arteries. The globes and orbits are intact. Mild mucosal thickening is present in the left frontal sinus in the anterior ethmoid air cells bilaterally. The mastoid air cells are clear.  The brain is otherwise unremarkable. No acute infarct, hemorrhage, or mass lesion is present. The ventricles are of normal size.  MRI CERVICAL SPINE FINDINGS  The postcontrast images of the cervical spine demonstrate symmetric lateral enhancement from the craniocervical junction to the C1-2 level. There is asymmetric left-sided posterior lateral dural enhancement at the level of C4. Diffuse dorsal enhancement is evident from C5 through T2. This involves both the dorsal aspect of the cord and the dura. There is no pathologic enhancement of the vertebral bodies.  MRI THORACIC SPINE  FINDINGS  There is diffuse cord signal abnormality in some expansion through the entire thoracic cord to the level of the conus medullaris which terminates at T12. Marrow signal, vertebral body heights, alignment are normal. Segment focal areas of dorsal cord in dural enhancement are present from T4 through T7 and again most notably from T9-10 through T11-12. There is some degree of diffuse dural enhancement throughout the thoracic spinal cord. Portions of the dorsal cord enhance as well.  There is no focal disc protrusion or central canal stenosis. The foramina are widely patent bilaterally.  IMPRESSION: 1. The findings are nonspecific but suggestive of ADEM. Given the extensive segmental areas of dural enhancement, an infectious myelitis must be considered. Neuromyelitis optica is considered. Without other etiology, this would be considered idiopathic acute transverse myelitis. Multiple sclerosis is considered less likely. This does not appear to be a neoplastic process. 2. Subtle T2 changes in the brain are less prominent than in the thoracic spine and likely related to the same process. 3. Mild diffuse dural enhancement is present over the brain and spinal cord. 4. Focal enhancement is present along the up minimal surface of the left lateral ventricle as described. These results were called by telephone at the time of interpretation on 08/04/2014 at 8:51 pm to Dr. Wallie Char, who verbally acknowledged these results.   Electronically Signed   By: Lawrence Santiago M.D.   On: 08/04/2014 20:52   Mr Cervical Spine Wo Contrast  08/04/2014   CLINICAL DATA:  Neck pain with weakness and numbness in both legs, left greater than right. Fall in bathtub July, 2015.  EXAM: MRI CERVICAL SPINE WITHOUT CONTRAST  TECHNIQUE: Multiplanar, multisequence MR imaging of the cervical spine was performed. No intravenous contrast was administered.  COMPARISON:  08/01/2014 CT scan  FINDINGS: Prominent symmetric cord expansion and  edema signal extending from the C2 level down through at least the T8 level is observed, with corresponding low T1 signal in this large segment of the cord. No middle or lower upon teen abnormal signal is observed.  The craniocervical junction appears unremarkable. No vertebral subluxation is observed. No significant vertebral marrow edema is identified. Additional findings at individual levels are as follows:  C2-3:  Unremarkable.  C3-4: Mild disc bulge slightly indents the anterior cord but only because the cord is expanded.  C4-5: Mild disc bulge indents the anterior cord but only because the cord is expanded.  C5-6: Mild disc bulge indents the anterior cord but only because the cord is expanded.  C6-7: Small central disc protrusion indents the anterior cord but only because the cord is expanded.  C7-T1:  Unremarkable.  We obtained a limited due view of the upper thoracic spine on sagittal images which included down to T8 on T2 weighted images. Please be aware that this does not constitute a diagnostic evaluation of the thoracic spine and only gets a brief and unreliable look at the upper thoracic spine. This does appear to show the cord edema signal/expansion extending down at least to the T8 level.  IMPRESSION: 1. Prominent abnormal edema signal and expansion of the spinal cord extending from the C2 level at least down to the T8 level. Differential diagnostic considerations include HIV myelitis, postviral/post vaccination autoimmune myelitis, Devic's disease, or demyelinating process. Tumor seems unlikely given the extended involvement. Further imaging workup is recommended, to include MRI of the brain with and without contrast; MRI thoracic spine with and without contrast; and post-contrast images of the cervical spine for further characterization. Neurology consultation is also recommended. I have contacted by telephone the flow managers associated with the [facility] emergency room in order to track  down and contact the patient and have the patient immediately proceed to the [facility] Emergency Room for further neurologic assessment and imaging workup.   Electronically Signed   By: Sherryl Barters M.D.   On: 08/04/2014 13:03   Mr Cervical Spine W Contrast  08/04/2014   CLINICAL DATA:  Abnormal MRI of the cervical spine. Fall 2 months ago. Neck injury. Back and neck pain. Positive Lhermitte's sign.  EXAM: MRI HEAD WITHOUT AND WITH CONTRAST  MRI CERVICAL SPINE WITH CONTRAST  MRI THORACIC SPINE WITHOUT AND WITH CONTRAST  TECHNIQUE: Multiplanar, multiecho pulse sequences of the brain and surrounding structures, and cervical and thoracic spine, to include the craniocervical junction , were obtained without and with intravenous contrast.  CONTRAST:  54mL MULTIHANCE GADOBENATE DIMEGLUMINE 529 MG/ML IV SOLN  COMPARISON:  MRI cervical spine from the same day.  FINDINGS: MRI HEAD FINDINGS  A focal area of subcortical T2 hyperintensity is present in the posterior right frontal lobe. Abnormal signal is present along the genu of the corpus callosum, left greater than right.  There is associated enhancement along the posterior genu of the corpus callosum left greater than right. No other significant focal enhancement is present.  There is subtle diffuse periventricular white matter change bilaterally without significant associated enhancement. Mild dural enhancement is present throughout, most prominent over the left tentorium.  Flow is present in the major intracranial arteries. The globes and orbits are intact. Mild mucosal thickening is present in the left frontal sinus in the anterior ethmoid air cells bilaterally. The mastoid air cells are clear.  The brain is otherwise unremarkable. No acute infarct, hemorrhage, or mass lesion is present. The ventricles are of normal size.  MRI CERVICAL SPINE FINDINGS  The postcontrast images of the cervical spine demonstrate symmetric lateral enhancement from the craniocervical  junction to the C1-2 level. There is asymmetric left-sided posterior lateral dural enhancement at the level of C4. Diffuse dorsal enhancement is evident from C5 through T2. This involves both the dorsal aspect of the cord and the dura. There is no pathologic enhancement of the vertebral bodies.  MRI THORACIC SPINE FINDINGS  There is diffuse cord signal abnormality in some expansion through the entire thoracic cord to the level of the conus medullaris which terminates at T12. Marrow signal, vertebral body heights, alignment are normal. Segment focal areas of dorsal cord in  dural enhancement are present from T4 through T7 and again most notably from T9-10 through T11-12. There is some degree of diffuse dural enhancement throughout the thoracic spinal cord. Portions of the dorsal cord enhance as well.  There is no focal disc protrusion or central canal stenosis. The foramina are widely patent bilaterally.  IMPRESSION: 1. The findings are nonspecific but suggestive of ADEM. Given the extensive segmental areas of dural enhancement, an infectious myelitis must be considered. Neuromyelitis optica is considered. Without other etiology, this would be considered idiopathic acute transverse myelitis. Multiple sclerosis is considered less likely. This does not appear to be a neoplastic process. 2. Subtle T2 changes in the brain are less prominent than in the thoracic spine and likely related to the same process. 3. Mild diffuse dural enhancement is present over the brain and spinal cord. 4. Focal enhancement is present along the up minimal surface of the left lateral ventricle as described. These results were called by telephone at the time of interpretation on 08/04/2014 at 8:51 pm to Dr. Wallie Char, who verbally acknowledged these results.   Electronically Signed   By: Lawrence Santiago M.D.   On: 08/04/2014 20:52   Mr Thoracic Spine W Wo Contrast  08/04/2014   CLINICAL DATA:  Abnormal MRI of the cervical spine. Fall 2  months ago. Neck injury. Back and neck pain. Positive Lhermitte's sign.  EXAM: MRI HEAD WITHOUT AND WITH CONTRAST  MRI CERVICAL SPINE WITH CONTRAST  MRI THORACIC SPINE WITHOUT AND WITH CONTRAST  TECHNIQUE: Multiplanar, multiecho pulse sequences of the brain and surrounding structures, and cervical and thoracic spine, to include the craniocervical junction , were obtained without and with intravenous contrast.  CONTRAST:  67mL MULTIHANCE GADOBENATE DIMEGLUMINE 529 MG/ML IV SOLN  COMPARISON:  MRI cervical spine from the same day.  FINDINGS: MRI HEAD FINDINGS  A focal area of subcortical T2 hyperintensity is present in the posterior right frontal lobe. Abnormal signal is present along the genu of the corpus callosum, left greater than right.  There is associated enhancement along the posterior genu of the corpus callosum left greater than right. No other significant focal enhancement is present.  There is subtle diffuse periventricular white matter change bilaterally without significant associated enhancement. Mild dural enhancement is present throughout, most prominent over the left tentorium.  Flow is present in the major intracranial arteries. The globes and orbits are intact. Mild mucosal thickening is present in the left frontal sinus in the anterior ethmoid air cells bilaterally. The mastoid air cells are clear.  The brain is otherwise unremarkable. No acute infarct, hemorrhage, or mass lesion is present. The ventricles are of normal size.  MRI CERVICAL SPINE FINDINGS  The postcontrast images of the cervical spine demonstrate symmetric lateral enhancement from the craniocervical junction to the C1-2 level. There is asymmetric left-sided posterior lateral dural enhancement at the level of C4. Diffuse dorsal enhancement is evident from C5 through T2. This involves both the dorsal aspect of the cord and the dura. There is no pathologic enhancement of the vertebral bodies.  MRI THORACIC SPINE FINDINGS  There is  diffuse cord signal abnormality in some expansion through the entire thoracic cord to the level of the conus medullaris which terminates at T12. Marrow signal, vertebral body heights, alignment are normal. Segment focal areas of dorsal cord in dural enhancement are present from T4 through T7 and again most notably from T9-10 through T11-12. There is some degree of diffuse dural enhancement throughout the thoracic spinal cord. Portions  of the dorsal cord enhance as well.  There is no focal disc protrusion or central canal stenosis. The foramina are widely patent bilaterally.  IMPRESSION: 1. The findings are nonspecific but suggestive of ADEM. Given the extensive segmental areas of dural enhancement, an infectious myelitis must be considered. Neuromyelitis optica is considered. Without other etiology, this would be considered idiopathic acute transverse myelitis. Multiple sclerosis is considered less likely. This does not appear to be a neoplastic process. 2. Subtle T2 changes in the brain are less prominent than in the thoracic spine and likely related to the same process. 3. Mild diffuse dural enhancement is present over the brain and spinal cord. 4. Focal enhancement is present along the up minimal surface of the left lateral ventricle as described. These results were called by telephone at the time of interpretation on 08/04/2014 at 8:51 pm to Dr. Wallie Char, who verbally acknowledged these results.   Electronically Signed   By: Lawrence Santiago M.D.   On: 08/04/2014 20:52    Scheduled Meds: . methylPREDNISolone (SOLU-MEDROL) injection  500 mg Intravenous Q12H  . pantoprazole (PROTONIX) IV  40 mg Intravenous Q24H   Continuous Infusions:   Principal Problem:   Myelitis    Time spent: 40 minutes   [facility] Pager [phone]. If 7PM-7AM, please contact night-coverage at [email or web address], password [facility] 08/05/2014, 11:13 AM  LOS: 1 day

## 2014-08-05 NOTE — Evaluation (Addendum)
Agree with SPT evaluation and discharge. Gregory Fuller, [place] Charges: 1 PT evaluation

## 2014-08-05 NOTE — ED Provider Notes (Signed)
Medical screening examination/treatment/procedure(s) were conducted as a shared visit with non-physician practitioner(s) and myself.  I personally evaluated the patient during the encounter.   EKG Interpretation None       Patient with abnormal cord signal and edema on his MRI from earlier today. Patient has vague numbness and tingling in arms and legs but no focal weakness. No headaches or blurry vision. Concern for demyelinating process. Discussed with neurology, Dr. Leonel Ramsay, who has ordered multiple other MRIs and patient will need admission to the hospitalist.  Ephraim Hamburger, MD 08/05/14 [phone]

## 2014-08-05 NOTE — Progress Notes (Signed)
Patient arrived to 4N11 from ED via stretcher. Pt ambulatory. A+Ox4. VSS. Pt oriented to room and equipment. Will continue to monitor.

## 2014-08-05 NOTE — Procedures (Signed)
Indication: Transverse myelitis  Risks of the procedure were dicussed with the patient including post-LP headache, bleeding, infection, weakness/numbness of legs(radiculopathy), death.  The patient/patient's proxy agreed and written consent was obtained.   The patient was prepped and draped, and using sterile technique a 20 gauge quinke spinal needle was inserted in the L4-5 space. Bony resistance was met repeatedly, and therefore the space was changed to L5-S1. Despite the sensation of being in the space, no fluid was obtained. The patient was repositioned in the sitting position and again the L4-5 space was tried. The patient experienced brief radicular pain repeatedly each time the needle was repositioned, yet no CSF was obtained.  Will attempt to arrange under radiology.  Roland Rack, MD [facility] [phone]  If 7pm- 7am, please page neurology on call as listed in [place].

## 2014-08-05 NOTE — Evaluation (Addendum)
Physical Therapy Evaluation and discharge  Patient Details Name: Gregory Fuller MRN: [id number] DOB: [birth year] Today's Date: 08/05/2014   History of Present Illness  Pt is a 33 y.o. male presenting with spinal edema C2-T8 and neck and back pain with LE numbness s/p falling in July.  Hx of asthma.   Clinical Impression  Pt is independent with all functional mobility and high level balance tasks assessed. Pt does not require further acute or follow up PT services. Will sign off.     Follow Up Recommendations No PT follow up    Equipment Recommendations  None recommended by PT    Recommendations for Other Services       Precautions / Restrictions        Mobility  Bed Mobility Overal bed mobility: Independent                Transfers Overall transfer level: Independent Equipment used: None                Ambulation/Gait Ambulation/Gait assistance: Independent Ambulation Distance (Feet): 120 Feet Assistive device: None Gait Pattern/deviations: WFL(Within Functional Limits)   Gait velocity interpretation: at or above normal speed for age/gender    Stairs Stairs: Yes Stairs assistance: Independent Stair Management: One rail Left;Alternating pattern Number of Stairs: 12    Wheelchair Mobility    Modified Rankin (Stroke Patients Only)       Balance Overall balance assessment: Independent Sitting-balance support: No upper extremity supported;Feet unsupported Sitting balance-Leahy Scale: Normal     Standing balance support: No upper extremity supported;During functional activity Standing balance-Leahy Scale: Normal     Single Leg Stance - Left Leg:  (30 seconds)         High level balance activites: Braiding;Turns;Head turns;Other (comment) (tandem walking) High Level Balance Comments: pt does not demonstrate any instability during balance tasks, no assist required             Pertinent Vitals/Pain Pain Assessment: No/denies pain     Home Living Family/patient expects to be discharged to:: Private residence     Type of Home: House       Home Layout: Two level Home Equipment: None      Prior Function Level of Independence: Independent               Hand Dominance        Extremity/Trunk Assessment   Upper Extremity Assessment: Overall WFL for tasks assessed           Lower Extremity Assessment: Overall WFL for tasks assessed;LLE deficits/detail;RLE deficits/detail RLE Deficits / Details: pt reports N/T throughout LE LLE Deficits / Details: pt reports N/T throughout extremity  Cervical / Trunk Assessment: Normal  Communication   Communication: No difficulties  Cognition Arousal/Alertness: Awake/alert Behavior During Therapy: WFL for tasks assessed/performed Overall Cognitive Status: Within Functional Limits for tasks assessed                      General Comments General comments (skin integrity, edema, etc.): Pt demonstrating no deficits with functional mobility or high level mobility skills. Discussed and educated pt on exercises and the importance of listening to his body. Encouraged pt to be up and moving as long as he informs NSG if he leaves the room.    Exercises        Assessment/Plan    PT Assessment Patent does not need any further PT services  PT Diagnosis     PT Problem List  PT Treatment Interventions     PT Goals (Current goals can be found in the Care Plan section) Acute Rehab PT Goals Patient Stated Goal: to go home PT Goal Formulation: With patient Time For Goal Achievement: 08/19/14 Potential to Achieve Goals: Good    Frequency     Barriers to discharge        Co-evaluation               End of Session Equipment Utilized During Treatment: Gait belt Activity Tolerance: Patient tolerated treatment well Patient left: in bed;with call bell/phone within reach Nurse Communication: Mobility status         Time: 7116-5790 PT Time  Calculation (min): 9 min   Charges:         PT G Codes:          [place],Gregory Fuller 08/05/2014, 3:20 PM Gregory Fuller, SPT

## 2014-08-05 NOTE — Progress Notes (Signed)
Subjective: No changes overnight, neck feels better  Exam: Filed Vitals:   08/05/14 1942  BP: 137/68  Pulse: 95  Temp: 98.7 F (37.1 C)  Resp: 18   Gen: In bed, NAD MS: Awake, alert, oriented, appropriate CN: Pupils equal round and reactive, extraocular movements intact Motor: Moves all extremity as well Sensory: Decreased to pinprick distally   ANA positive, reflex pending Angiotensin-converting enzyme-negative ESR, CRP-negative Vitamin E-pending HIV antibody-negative ENA-negative Rheumatoid factor-negative Neuromyelitis optica antibody-pending RPR-negative ANCA  -  negative   Lyme titers-pending Cardiolipin antibodies is pending  vitamin E-pending EBV-pending    Impression: 33 year old male with myelitis of unclear etiology. Differential diagnosis includes multiple sclerosis, neuromyelitis optica, infectious myelitis, autoimmune disease, metabolic myelopathy. The longitudinally extensive nature of this as well as dural involvement  makes me concerned for autoimmune etiology, labs are pending.The time course would be very unusual for infectious etiology given that has been present now for months.    Recommendations: 1)lumbar puncture with CSF for studies as previously outlined  2) continue solumedrol 500 mg twice a day  3) West Nile antibodies   Roland Rack, MD [facility] [phone]  If 7pm- 7am, please page neurology on call as listed in [place].

## 2014-08-05 NOTE — Consult Note (Signed)
Name: Gregory Fuller MRN: [id number] DOB: 1981/01/03    ADMISSION DATE:  08/04/2014 CONSULTATION DATE:  10/1  REFERRING MD :  [facility]  CHIEF COMPLAINT:  Back pain  BRIEF PATIENT DESCRIPTION:  NAD AAM with back/neck pain  SIGNIFICANT EVENTS  9/30 ana +  STUDIES:  10/1 CT chest: RT hilar adenopathy Rt irregular pleural based lesions 4 mm nodule 9/30 HIV neg  HISTORY OF PRESENT ILLNESS:   33 yo AAM with known asthma(prn albuterol MDI) occasional tobacco user along with recreational marijuana use who fell in tub 2 months ago and struck his back/neck without loss of consciousness .  He notes neck/back pain treated with steroids per urgent care. He improved for a time but symptoms have returned.He was evaluated by Neuro in ED and MRI of brain and spine with concern for myelitis which was treated with steroids and he has been admitted for further evaluation. Note his MRI demonstrated Posterior pleural disease RL and right hilar adenopathy.  He is scheduled for lumbar puncture per Neurology and PCCM asked to evaluate for possible FOB with bx.  PAST MEDICAL HISTORY :   has a past medical history of Asthma.  has past surgical history that includes No past surgeries. Prior to Admission medications   Medication Sig Start Date End Date Taking? Authorizing Provider  albuterol (PROVENTIL HFA;VENTOLIN HFA) 108 (90 BASE) MCG/ACT inhaler Inhale 2 puffs into the lungs every 6 (six) hours as needed. For asthma    Yes Historical Provider, MD  methocarbamol (ROBAXIN) 500 MG tablet Take 250-500 mg by mouth 3 (three) times daily as needed for muscle spasms.   Yes Historical Provider, MD  sulfacetamide (BLEPH-10) 10 % ophthalmic solution Place 2 drops into the left eye 4 (four) times daily.   Yes Historical Provider, MD   Allergies  Allergen Reactions  . Vicodin [Hydrocodone-Acetaminophen] Other (See Comments)    Made patient feel like he was in more pain    FAMILY HISTORY:  family history includes  Diabetes in his other; Thyroid disease in his mother. SOCIAL HISTORY: + tobacco use occasionally Recreational marijuana use   REVIEW OF SYSTEMS:   10 point review of system taken, please see HPI for positives and negatives.   SUBJECTIVE:   VITAL SIGNS: Temp:  [97.8 F (36.6 C)-99 F (37.2 C)] 97.8 F (36.6 C) (10/01 0550) Pulse Rate:  [67-94] 80 (10/01 0550) Resp:  [16] 16 (10/01 0550) BP: (132-164)/(65-93) 145/81 mmHg (10/01 0550) SpO2:  [98 %-100 %] 100 % (10/01 0550) Weight:  [204 lb 8 oz (92.761 kg)] 204 lb 8 oz (92.761 kg) (09/30 2347)  PHYSICAL EXAMINATION: General:  WNWD AAM . No acute distres Neuro:  MAE x 4, weakness and numbness improved with steroids HEENT:  No LAN/JVD, oral pharynx unremarkable Cardiovascular: HSR RRR Lungs:  CTA Abdomen: Soft NT Musculoskeletal:  Intact Skin: warm and dry   Recent Labs Lab 08/01/14 0227 08/04/14 1627 08/05/14 0730  NA 138 140 137  K 4.1 4.1 4.4  CL 103 102 101  CO2  --  27 23  BUN 18 11 11   CREATININE 1.20 1.16 1.04  GLUCOSE 111* 88 134*    Recent Labs Lab 08/01/14 0227 08/04/14 1627 08/05/14 0730  HGB 15.3 14.9 14.9  HCT 45.0 42.0 42.4  WBC  --  6.0 6.3  PLT  --  236 236   Dg Chest 2 View  08/04/2014   CLINICAL DATA:  Abnormal C-spine MRI. Differential considerations include sarcoidosis.  EXAM: CHEST  2 VIEW  COMPARISON:  None.  FINDINGS: Lungs are essentially clear. No focal consolidation. No pleural effusion or pneumothorax.  The heart is normal in size.  Mild prominence of the bilateral pulmonary hila. Adenopathy is possible. Right paratracheal stripe is not enlarged.  Visualized osseous structures are within normal limits.  IMPRESSION: Mild prominence of the bilateral pulmonary hila, possibly reflecting hilar adenopathy, although equivocal.  If further imaging evaluation is desired, consider CT chest with contrast.  Otherwise, no evidence of acute cardiopulmonary disease.   Electronically Signed   By: Julian Hy M.D.   On: 08/04/2014 18:10   Ct Chest W Contrast  08/05/2014   CLINICAL DATA:  Hilar prominence on chest radiates atrophy, query adenopathy. Query sarcoidosis. The patient has a encephalomyelitis based on narrow imaging.  EXAM: CT CHEST WITH CONTRAST  TECHNIQUE: Multidetector CT imaging of the chest was performed during intravenous contrast administration.  CONTRAST:  54mL OMNIPAQUE IOHEXOL 300 MG/ML  SOLN  COMPARISON:  Multiple exams, including 08/04/2014  FINDINGS: CT confirms right hilar and infrahilar adenopathy, with a right posterior hilar node measuring 1.7 cm in short axis and a right infrahilar node measuring 1.6 cm in short axis on images 25 and 27, respectively. Additional enlarged infrahilar and hilar nodes are present on the right side. No adenopathy on the left or in the remainder the mediastinum. No axillary or internal mammary adenopathy. No definite regional endobronchial lesion or significant extrinsic narrowing of the airways.  Peripherally in the left lower lobe there is a pleural based slightly irregularly marginated lesion on image 43 of series 3 measuring 1.4 cm at its pleural base and 0.7 cm in height. Adjacent to this is a flat but slightly irregular 2.8 by 0.5 cm subpleural lesion. There is a 4 mm left lower lobe nodule on image 35 of series 3 which appears noncalcified.  No definite nodular airway thickening.  IMPRESSION: 1. Pathologic right hilar and infrahilar adenopathy but without left hilar or mediastinal adenopathy, and with slightly irregular pleural based lesions medially in the right lower lobe in this patient with known segmental regions of dural enhancement and myelitis throughout the spinal cord and in portions of the brain. A unifying diagnosis is problematic as the imaging appearance is not considered highly characteristic for a specific disease process. The unilateral hilar adenopathy and lack of nodular airway thickening would be unusual for sarcoidosis with  neurosarcoidosis as the underlying cause, but it would also be unusual for this to represent a non-small cell lung cancer with dural metastatic disease and paraneoplastic myelitis and cerebritis given the patient's young age. Correlation with serum ACE levels suggested in working up the sarcoidosis angle although these are not entirely specific. Bronchoscopic biopsy of the lymph nodes may provide useful diagnostic information. With regard to the pleural-based lesions, I am uncertain whether these are true lesions or simply represent regional atelectasis. There is an adjacent 4 mm nodule in the right lower lobe, too small to biopsy percutaneously. Entities such as right-sided tuberculosis with TB involvement of the derived likewise seem unlikely.   Electronically Signed   By: Sherryl Barters M.D.   On: 08/05/2014 10:44   Mr Jeri Cos NO Contrast  08/05/2014   ADDENDUM REPORT: 08/05/2014 12:20  ADDENDUM: In comparison with the subsequent CT, a right hilar adenopathy is evident. Posterior medial pleural disease is also present in the right lower lobe. This may correspond with an infectious etiology.  Although the CT scan is not typical of sarcoid, sarcoidosis  should also be considered within the spine.   Electronically Signed   By: Lawrence Santiago M.D.   On: 08/05/2014 12:20   08/05/2014   CLINICAL DATA:  Abnormal MRI of the cervical spine. Fall 2 months ago. Neck injury. Back and neck pain. Positive Lhermitte's sign.  EXAM: MRI HEAD WITHOUT AND WITH CONTRAST  MRI CERVICAL SPINE WITH CONTRAST  MRI THORACIC SPINE WITHOUT AND WITH CONTRAST  TECHNIQUE: Multiplanar, multiecho pulse sequences of the brain and surrounding structures, and cervical and thoracic spine, to include the craniocervical junction , were obtained without and with intravenous contrast.  CONTRAST:  21mL MULTIHANCE GADOBENATE DIMEGLUMINE 529 MG/ML IV SOLN  COMPARISON:  MRI cervical spine from the same day.  FINDINGS: MRI HEAD FINDINGS  A focal area of  subcortical T2 hyperintensity is present in the posterior right frontal lobe. Abnormal signal is present along the genu of the corpus callosum, left greater than right.  There is associated enhancement along the posterior genu of the corpus callosum left greater than right. No other significant focal enhancement is present.  There is subtle diffuse periventricular white matter change bilaterally without significant associated enhancement. Mild dural enhancement is present throughout, most prominent over the left tentorium.  Flow is present in the major intracranial arteries. The globes and orbits are intact. Mild mucosal thickening is present in the left frontal sinus in the anterior ethmoid air cells bilaterally. The mastoid air cells are clear.  The brain is otherwise unremarkable. No acute infarct, hemorrhage, or mass lesion is present. The ventricles are of normal size.  MRI CERVICAL SPINE FINDINGS  The postcontrast images of the cervical spine demonstrate symmetric lateral enhancement from the craniocervical junction to the C1-2 level. There is asymmetric left-sided posterior lateral dural enhancement at the level of C4. Diffuse dorsal enhancement is evident from C5 through T2. This involves both the dorsal aspect of the cord and the dura. There is no pathologic enhancement of the vertebral bodies.  MRI THORACIC SPINE FINDINGS  There is diffuse cord signal abnormality in some expansion through the entire thoracic cord to the level of the conus medullaris which terminates at T12. Marrow signal, vertebral body heights, alignment are normal. Segment focal areas of dorsal cord in dural enhancement are present from T4 through T7 and again most notably from T9-10 through T11-12. There is some degree of diffuse dural enhancement throughout the thoracic spinal cord. Portions of the dorsal cord enhance as well.  There is no focal disc protrusion or central canal stenosis. The foramina are widely patent bilaterally.   IMPRESSION: 1. The findings are nonspecific but suggestive of ADEM. Given the extensive segmental areas of dural enhancement, an infectious myelitis must be considered. Neuromyelitis optica is considered. Without other etiology, this would be considered idiopathic acute transverse myelitis. Multiple sclerosis is considered less likely. This does not appear to be a neoplastic process. 2. Subtle T2 changes in the brain are less prominent than in the thoracic spine and likely related to the same process. 3. Mild diffuse dural enhancement is present over the brain and spinal cord. 4. Focal enhancement is present along the up minimal surface of the left lateral ventricle as described. These results were called by telephone at the time of interpretation on 08/04/2014 at 8:51 pm to Dr. Wallie Char, who verbally acknowledged these results.  Electronically Signed: By: Lawrence Santiago M.D. On: 08/04/2014 20:52   Mr Cervical Spine Wo Contrast  08/04/2014   CLINICAL DATA:  Neck pain with weakness and  numbness in both legs, left greater than right. Fall in bathtub July, 2015.  EXAM: MRI CERVICAL SPINE WITHOUT CONTRAST  TECHNIQUE: Multiplanar, multisequence MR imaging of the cervical spine was performed. No intravenous contrast was administered.  COMPARISON:  08/01/2014 CT scan  FINDINGS: Prominent symmetric cord expansion and edema signal extending from the C2 level down through at least the T8 level is observed, with corresponding low T1 signal in this large segment of the cord. No middle or lower upon teen abnormal signal is observed.  The craniocervical junction appears unremarkable. No vertebral subluxation is observed. No significant vertebral marrow edema is identified. Additional findings at individual levels are as follows:  C2-3:  Unremarkable.  C3-4: Mild disc bulge slightly indents the anterior cord but only because the cord is expanded.  C4-5: Mild disc bulge indents the anterior cord but only because the cord  is expanded.  C5-6: Mild disc bulge indents the anterior cord but only because the cord is expanded.  C6-7: Small central disc protrusion indents the anterior cord but only because the cord is expanded.  C7-T1:  Unremarkable.  We obtained a limited due view of the upper thoracic spine on sagittal images which included down to T8 on T2 weighted images. Please be aware that this does not constitute a diagnostic evaluation of the thoracic spine and only gets a brief and unreliable look at the upper thoracic spine. This does appear to show the cord edema signal/expansion extending down at least to the T8 level.  IMPRESSION: 1. Prominent abnormal edema signal and expansion of the spinal cord extending from the C2 level at least down to the T8 level. Differential diagnostic considerations include HIV myelitis, postviral/post vaccination autoimmune myelitis, Devic's disease, or demyelinating process. Tumor seems unlikely given the extended involvement. Further imaging workup is recommended, to include MRI of the brain with and without contrast; MRI thoracic spine with and without contrast; and post-contrast images of the cervical spine for further characterization. Neurology consultation is also recommended. I have contacted by telephone the flow managers associated with the [facility] emergency room in order to track down and contact the patient and have the patient immediately proceed to the [facility] Emergency Room for further neurologic assessment and imaging workup.   Electronically Signed   By: Sherryl Barters M.D.   On: 08/04/2014 13:03   Mr Cervical Spine W Contrast  08/05/2014   ADDENDUM REPORT: 08/05/2014 12:20  ADDENDUM: In comparison with the subsequent CT, a right hilar adenopathy is evident. Posterior medial pleural disease is also present in the right lower lobe. This may correspond with an infectious etiology.  Although the CT scan is not typical of sarcoid, sarcoidosis should also be  considered within the spine.   Electronically Signed   By: Lawrence Santiago M.D.   On: 08/05/2014 12:20   08/05/2014   CLINICAL DATA:  Abnormal MRI of the cervical spine. Fall 2 months ago. Neck injury. Back and neck pain. Positive Lhermitte's sign.  EXAM: MRI HEAD WITHOUT AND WITH CONTRAST  MRI CERVICAL SPINE WITH CONTRAST  MRI THORACIC SPINE WITHOUT AND WITH CONTRAST  TECHNIQUE: Multiplanar, multiecho pulse sequences of the brain and surrounding structures, and cervical and thoracic spine, to include the craniocervical junction , were obtained without and with intravenous contrast.  CONTRAST:  42mL MULTIHANCE GADOBENATE DIMEGLUMINE 529 MG/ML IV SOLN  COMPARISON:  MRI cervical spine from the same day.  FINDINGS: MRI HEAD FINDINGS  A focal area of subcortical T2 hyperintensity is present  in the posterior right frontal lobe. Abnormal signal is present along the genu of the corpus callosum, left greater than right.  There is associated enhancement along the posterior genu of the corpus callosum left greater than right. No other significant focal enhancement is present.  There is subtle diffuse periventricular white matter change bilaterally without significant associated enhancement. Mild dural enhancement is present throughout, most prominent over the left tentorium.  Flow is present in the major intracranial arteries. The globes and orbits are intact. Mild mucosal thickening is present in the left frontal sinus in the anterior ethmoid air cells bilaterally. The mastoid air cells are clear.  The brain is otherwise unremarkable. No acute infarct, hemorrhage, or mass lesion is present. The ventricles are of normal size.  MRI CERVICAL SPINE FINDINGS  The postcontrast images of the cervical spine demonstrate symmetric lateral enhancement from the craniocervical junction to the C1-2 level. There is asymmetric left-sided posterior lateral dural enhancement at the level of C4. Diffuse dorsal enhancement is evident from C5  through T2. This involves both the dorsal aspect of the cord and the dura. There is no pathologic enhancement of the vertebral bodies.  MRI THORACIC SPINE FINDINGS  There is diffuse cord signal abnormality in some expansion through the entire thoracic cord to the level of the conus medullaris which terminates at T12. Marrow signal, vertebral body heights, alignment are normal. Segment focal areas of dorsal cord in dural enhancement are present from T4 through T7 and again most notably from T9-10 through T11-12. There is some degree of diffuse dural enhancement throughout the thoracic spinal cord. Portions of the dorsal cord enhance as well.  There is no focal disc protrusion or central canal stenosis. The foramina are widely patent bilaterally.  IMPRESSION: 1. The findings are nonspecific but suggestive of ADEM. Given the extensive segmental areas of dural enhancement, an infectious myelitis must be considered. Neuromyelitis optica is considered. Without other etiology, this would be considered idiopathic acute transverse myelitis. Multiple sclerosis is considered less likely. This does not appear to be a neoplastic process. 2. Subtle T2 changes in the brain are less prominent than in the thoracic spine and likely related to the same process. 3. Mild diffuse dural enhancement is present over the brain and spinal cord. 4. Focal enhancement is present along the up minimal surface of the left lateral ventricle as described. These results were called by telephone at the time of interpretation on 08/04/2014 at 8:51 pm to Dr. Wallie Char, who verbally acknowledged these results.  Electronically Signed: By: Lawrence Santiago M.D. On: 08/04/2014 20:52   Mr Thoracic Spine W Wo Contrast  08/05/2014   ADDENDUM REPORT: 08/05/2014 12:20  ADDENDUM: In comparison with the subsequent CT, a right hilar adenopathy is evident. Posterior medial pleural disease is also present in the right lower lobe. This may correspond with an  infectious etiology.  Although the CT scan is not typical of sarcoid, sarcoidosis should also be considered within the spine.   Electronically Signed   By: Lawrence Santiago M.D.   On: 08/05/2014 12:20   08/05/2014   CLINICAL DATA:  Abnormal MRI of the cervical spine. Fall 2 months ago. Neck injury. Back and neck pain. Positive Lhermitte's sign.  EXAM: MRI HEAD WITHOUT AND WITH CONTRAST  MRI CERVICAL SPINE WITH CONTRAST  MRI THORACIC SPINE WITHOUT AND WITH CONTRAST  TECHNIQUE: Multiplanar, multiecho pulse sequences of the brain and surrounding structures, and cervical and thoracic spine, to include the craniocervical junction , were obtained  without and with intravenous contrast.  CONTRAST:  12mL MULTIHANCE GADOBENATE DIMEGLUMINE 529 MG/ML IV SOLN  COMPARISON:  MRI cervical spine from the same day.  FINDINGS: MRI HEAD FINDINGS  A focal area of subcortical T2 hyperintensity is present in the posterior right frontal lobe. Abnormal signal is present along the genu of the corpus callosum, left greater than right.  There is associated enhancement along the posterior genu of the corpus callosum left greater than right. No other significant focal enhancement is present.  There is subtle diffuse periventricular white matter change bilaterally without significant associated enhancement. Mild dural enhancement is present throughout, most prominent over the left tentorium.  Flow is present in the major intracranial arteries. The globes and orbits are intact. Mild mucosal thickening is present in the left frontal sinus in the anterior ethmoid air cells bilaterally. The mastoid air cells are clear.  The brain is otherwise unremarkable. No acute infarct, hemorrhage, or mass lesion is present. The ventricles are of normal size.  MRI CERVICAL SPINE FINDINGS  The postcontrast images of the cervical spine demonstrate symmetric lateral enhancement from the craniocervical junction to the C1-2 level. There is asymmetric left-sided  posterior lateral dural enhancement at the level of C4. Diffuse dorsal enhancement is evident from C5 through T2. This involves both the dorsal aspect of the cord and the dura. There is no pathologic enhancement of the vertebral bodies.  MRI THORACIC SPINE FINDINGS  There is diffuse cord signal abnormality in some expansion through the entire thoracic cord to the level of the conus medullaris which terminates at T12. Marrow signal, vertebral body heights, alignment are normal. Segment focal areas of dorsal cord in dural enhancement are present from T4 through T7 and again most notably from T9-10 through T11-12. There is some degree of diffuse dural enhancement throughout the thoracic spinal cord. Portions of the dorsal cord enhance as well.  There is no focal disc protrusion or central canal stenosis. The foramina are widely patent bilaterally.  IMPRESSION: 1. The findings are nonspecific but suggestive of ADEM. Given the extensive segmental areas of dural enhancement, an infectious myelitis must be considered. Neuromyelitis optica is considered. Without other etiology, this would be considered idiopathic acute transverse myelitis. Multiple sclerosis is considered less likely. This does not appear to be a neoplastic process. 2. Subtle T2 changes in the brain are less prominent than in the thoracic spine and likely related to the same process. 3. Mild diffuse dural enhancement is present over the brain and spinal cord. 4. Focal enhancement is present along the up minimal surface of the left lateral ventricle as described. These results were called by telephone at the time of interpretation on 08/04/2014 at 8:51 pm to Dr. Wallie Char, who verbally acknowledged these results.  Electronically Signed: By: Lawrence Santiago M.D. On: 08/04/2014 20:52    ASSESSMENT   Myelitis   Asthma, chronic Discussion: 33 yo AAM with known asthma(prn albuterol MDI) occasional tobacco user along with recreational marijuana use who  fell in tub 2 months ago and struck his back/neck without loss of consciousness .  He notes neck/back pain treated with steroids per urgent care. He improved for a time but symptoms have returned.He was evaluated by Neuro in ED and MRI of brain and spine with concern for myelitis which was treated with steroids and he has been admitted for further evaluation. Note his MRI demonstrated Posterior pleural disease RL and right hilar adenopathy.  He is scheduled for lumbar puncture per Neurology and PCCM  asked to evaluate for possible FOB with bx.   PLAN: He is not likely to have a malignancy at age 61. Agree with lumbar puncture MD to evaluate for role of FOB with Bx. ? If he has  Autoimmune disorder(consider HIV testing and  further ID work up. Negative 9/30) Check for TB  [facility] ACNP Maryanna Shape PCCM Pager [phone] till 3 pm If no answer page [phone] 08/05/2014, 1:25 PM   Patient with no medical history presenting with total spinal cord myelitis.  DDx is sarcoid vs lupus, infectious etiology are unlikely to involve lung as well.  All auto-immune serologies are sent and pending.  No need for bronch and biopsy for now.  LP to be done today and will await cytology.  If cytology is negative then will consider EBUS.  Given neuro symptoms, if LP is clean from infectious etiology then sooner rather than alter starting steroids would be the next course of action.  I would recommend involving rheumatology in this case given the clinical picture.  PCCM will follow peripherally.  Patient seen and examined, agree with above note.  I dictated the care and orders written for this patient under my direction.  Rush Farmer, MD [phone]

## 2014-08-06 ENCOUNTER — Inpatient Hospital Stay (HOSPITAL_COMMUNITY): Payer: 59

## 2014-08-06 ENCOUNTER — Other Ambulatory Visit: Payer: Self-pay | Admitting: Diagnostic Radiology

## 2014-08-06 DIAGNOSIS — G0489 Other myelitis: Secondary | ICD-10-CM

## 2014-08-06 LAB — PROTEIN AND GLUCOSE, CSF
Glucose, CSF: 85 mg/dL — ABNORMAL HIGH (ref 43–76)
Total  Protein, CSF: 114 mg/dL — ABNORMAL HIGH (ref 15–45)

## 2014-08-06 LAB — GRAM STAIN

## 2014-08-06 LAB — ANCA TITERS: C-ANCA: 1:80 {titer} — ABNORMAL HIGH

## 2014-08-06 LAB — ANCA SCREEN W REFLEX TITER
Atypical p-ANCA Screen: NEGATIVE
c-ANCA Screen: POSITIVE — AB
p-ANCA Screen: NEGATIVE

## 2014-08-06 LAB — CSF CELL COUNT WITH DIFFERENTIAL
EOS CSF: NONE SEEN % (ref 0–1)
Lymphs, CSF: 98 % — ABNORMAL HIGH (ref 40–80)
Monocyte-Macrophage-Spinal Fluid: 2 % — ABNORMAL LOW (ref 15–45)
RBC Count, CSF: 4 /mm3 — ABNORMAL HIGH
Segmented Neutrophils-CSF: NONE SEEN % (ref 0–6)
Tube #: 1
WBC CSF: 310 /mm3 — AB (ref 0–5)

## 2014-08-06 LAB — B. BURGDORFI ANTIBODIES: B BURGDORFERI AB IGG+ IGM: 0.78 {ISR}

## 2014-08-06 LAB — LACTATE DEHYDROGENASE: LDH: 171 U/L (ref 94–250)

## 2014-08-06 MED ORDER — PANTOPRAZOLE SODIUM 40 MG PO TBEC
40.0000 mg | DELAYED_RELEASE_TABLET | Freq: Every day | ORAL | Status: DC
  Administered 2014-08-06 – 2014-08-09 (×4): 40 mg via ORAL
  Filled 2014-08-06 (×4): qty 1

## 2014-08-06 MED ORDER — SODIUM CHLORIDE 0.9 % IV SOLN
500.0000 mg | Freq: Two times a day (BID) | INTRAVENOUS | Status: AC
  Administered 2014-08-06 – 2014-08-09 (×6): 500 mg via INTRAVENOUS
  Filled 2014-08-06 (×7): qty 4

## 2014-08-06 MED ORDER — POLYETHYLENE GLYCOL 3350 17 G PO PACK: 17.0000 g | PACK | Freq: Every day | ORAL | Status: DC

## 2014-08-06 MED ORDER — ENOXAPARIN SODIUM 40 MG/0.4ML ~~LOC~~ SOLN
40.0000 mg | SUBCUTANEOUS | Status: DC
  Administered 2014-08-06 – 2014-08-08 (×3): 40 mg via SUBCUTANEOUS
  Filled 2014-08-06 (×3): qty 0.4

## 2014-08-06 NOTE — Progress Notes (Addendum)
[facility] PROGRESS NOTE  Gregory Fuller YTK:[id number] DOB: 02/15/1981 DOA: 08/04/2014 PCP: No PCP Per Patient  Assessment/Plan: Principal Problem:   Myelitis Active Problems:   Asthma, chronic   Solitary pulmonary nodule   Pleural nodule   Mediastinal lymphadenopathy   Myelitis  Differential diagnosis includes multiple sclerosis, neuromyelitis optica, infectious myelitis, autoimmune disease, metabolic myelopathy.  Extensive workup ordered by neurology , including autoimmune workup,West Nile antibodies  Lumbar puncture , failed attempt by the bedside-to be done by interventional radiology today MRI brain with/without contrast, MRI c-spine w/contrast, MRI T-spine w/wo contrast findings suggest ADEM  Solumedrol 500mg  BID per neurology  infectious evaluation with CSF PCR for HSV-1, HSV-2, HHV-6, VZV, CMV, EBV, enteroviruses  CRP negative  ANA positive  Rheumatoid factor negative  HIV nonreactive  No physical therapy followup needed  Hilar adenopathy  Pathologic right hilar and infrahilar adenopathy  Not likely to have a malignancy per pulmonary, no indication for bronchoscopy at this time Await  lumbar cytology Question sarcoidosis,non-small cell lung cancer with dural metastatic disease and paraneoplastic myelitis  Appreciate pulmonary input   Constipation Started on MiraLAX  Start Lovenox for DVT prophylaxis after lumbar punctures completed  Code Status: full  Family Communication: family updated about patient's clinical progress  Disposition Plan: Per neurology   Brief narrative:  Gregory Fuller is an 33 y.o. male who fell in the tube 2 months ago. He noted he slipped and when he landed he landed on his neck. He did not lose consciousness. A few days after his fall he noted both back and neck pain. He also noted when he looked down he had a electrical shock like sen sensation that went down his back. He seeked medical attention and was given a shot of steroids which  seemed to help. A few days later his pain returned and he went to urgent care where he was give a 10 mg steroid dose pack. This seemed to help the pain, but again when he finished the dose pack his neck pain returned. Patient obtained a MRI of cervical spine today which demonstrated abnormal cord signal from C2-T8.   Consultants:  Neurology  Pulmonary Procedures:  Lumbar puncture Antibiotics:  None    HPI/Subjective: Patient wants something for constipation otherwise feels fine  Objective: Filed Vitals:   08/06/14 0200 08/06/14 0500 08/06/14 0830 08/06/14 0945  BP: 144/80 130/71 158/88 161/84  Pulse: 75 77 80 75  Temp: 98.4 F (36.9 C) 98 F (36.7 C) 99 F (37.2 C) 99.2 F (37.3 C)  TempSrc: Oral Oral Oral Oral  Resp: 20 20 18 18   Height:      Weight:      SpO2: 100% 96% 100% 100%    Intake/Output Summary (Last 24 hours) at 08/06/14 1020 Last data filed at 08/06/14 0916  Gross per 24 hour  Intake    240 ml  Output    600 ml  Net   -360 ml    Exam:  General: alert & oriented x 3 In NAD  Cardiovascular: RRR, nl S1 s2  Respiratory: Decreased breath sounds at the bases, scattered rhonchi, no crackles  Abdomen: soft +BS NT/ND, no masses palpable  Extremities: No cyanosis and no edema      Data Reviewed: Basic Metabolic Panel:  Recent Labs Lab 08/01/14 0227 08/04/14 1627 08/05/14 0730  NA 138 140 137  K 4.1 4.1 4.4  CL 103 102 101  CO2  --  27 23  GLUCOSE 111* 88 134*  BUN 18 11 11   CREATININE 1.20 1.16 1.04  CALCIUM  --  10.1 10.0    Liver Function Tests:  Recent Labs Lab 08/04/14 1627 08/05/14 0730  AST 21 17  ALT 29 25  ALKPHOS 71 69  BILITOT 0.6 0.8  PROT 8.3 8.4*  ALBUMIN 4.4 4.3   No results found for this basename: LIPASE, AMYLASE,  in the last 168 hours No results found for this basename: AMMONIA,  in the last 168 hours  CBC:  Recent Labs Lab 08/01/14 0227 08/04/14 1627 08/05/14 0730  WBC  --  6.0 6.3  NEUTROABS  --  3.6  5.6  HGB 15.3 14.9 14.9  HCT 45.0 42.0 42.4  MCV  --  78.9 77.1*  PLT  --  236 236    Cardiac Enzymes: No results found for this basename: CKTOTAL, CKMB, CKMBINDEX, TROPONINI,  in the last 168 hours BNP (last 3 results) No results found for this basename: PROBNP,  in the last 8760 hours   CBG: No results found for this basename: GLUCAP,  in the last 168 hours  No results found for this or any previous visit (from the past 240 hour(s)).   Studies: Dg Chest 2 View  08/04/2014   CLINICAL DATA:  Abnormal C-spine MRI. Differential considerations include sarcoidosis.  EXAM: CHEST  2 VIEW  COMPARISON:  None.  FINDINGS: Lungs are essentially clear. No focal consolidation. No pleural effusion or pneumothorax.  The heart is normal in size.  Mild prominence of the bilateral pulmonary hila. Adenopathy is possible. Right paratracheal stripe is not enlarged.  Visualized osseous structures are within normal limits.  IMPRESSION: Mild prominence of the bilateral pulmonary hila, possibly reflecting hilar adenopathy, although equivocal.  If further imaging evaluation is desired, consider CT chest with contrast.  Otherwise, no evidence of acute cardiopulmonary disease.   Electronically Signed   By: Julian Hy M.D.   On: 08/04/2014 18:10   Ct Chest W Contrast  08/05/2014   CLINICAL DATA:  Hilar prominence on chest radiates atrophy, query adenopathy. Query sarcoidosis. The patient has a encephalomyelitis based on narrow imaging.  EXAM: CT CHEST WITH CONTRAST  TECHNIQUE: Multidetector CT imaging of the chest was performed during intravenous contrast administration.  CONTRAST:  51mL OMNIPAQUE IOHEXOL 300 MG/ML  SOLN  COMPARISON:  Multiple exams, including 08/04/2014  FINDINGS: CT confirms right hilar and infrahilar adenopathy, with a right posterior hilar node measuring 1.7 cm in short axis and a right infrahilar node measuring 1.6 cm in short axis on images 25 and 27, respectively. Additional enlarged  infrahilar and hilar nodes are present on the right side. No adenopathy on the left or in the remainder the mediastinum. No axillary or internal mammary adenopathy. No definite regional endobronchial lesion or significant extrinsic narrowing of the airways.  Peripherally in the left lower lobe there is a pleural based slightly irregularly marginated lesion on image 43 of series 3 measuring 1.4 cm at its pleural base and 0.7 cm in height. Adjacent to this is a flat but slightly irregular 2.8 by 0.5 cm subpleural lesion. There is a 4 mm left lower lobe nodule on image 35 of series 3 which appears noncalcified.  No definite nodular airway thickening.  IMPRESSION: 1. Pathologic right hilar and infrahilar adenopathy but without left hilar or mediastinal adenopathy, and with slightly irregular pleural based lesions medially in the right lower lobe in this patient with known segmental regions of dural enhancement and myelitis throughout the spinal cord and in  portions of the brain. A unifying diagnosis is problematic as the imaging appearance is not considered highly characteristic for a specific disease process. The unilateral hilar adenopathy and lack of nodular airway thickening would be unusual for sarcoidosis with neurosarcoidosis as the underlying cause, but it would also be unusual for this to represent a non-small cell lung cancer with dural metastatic disease and paraneoplastic myelitis and cerebritis given the patient's young age. Correlation with serum ACE levels suggested in working up the sarcoidosis angle although these are not entirely specific. Bronchoscopic biopsy of the lymph nodes may provide useful diagnostic information. With regard to the pleural-based lesions, I am uncertain whether these are true lesions or simply represent regional atelectasis. There is an adjacent 4 mm nodule in the right lower lobe, too small to biopsy percutaneously. Entities such as right-sided tuberculosis with TB involvement  of the derived likewise seem unlikely.   Electronically Signed   By: Sherryl Barters M.D.   On: 08/05/2014 10:44   Ct Cervical Spine Wo Contrast  08/01/2014   CLINICAL DATA:  General pain and leg numbness for 3 months  EXAM: CT CERVICAL SPINE WITHOUT CONTRAST  TECHNIQUE: Multidetector CT imaging of the cervical spine was performed without intravenous contrast. Multiplanar CT image reconstructions were also generated.  COMPARISON:  None.  FINDINGS: The alignment is anatomic. The vertebral body heights are maintained. There is no acute fracture. There is no static listhesis. The prevertebral soft tissues are normal. The intraspinal soft tissues are not fully imaged on this examination due to poor soft tissue contrast, but there is no gross soft tissue abnormality.  The disc spaces are maintained. There is a mild broad-based disc bulge at C3-4 and C4-5.  The visualized portions of the lung apices demonstrate no focal abnormality.  IMPRESSION: No acute osseous injury of the cervical spine.   Electronically Signed   By: Kathreen Devoid   On: 08/01/2014 00:55   Ct Lumbar Spine Wo Contrast  08/01/2014   CLINICAL DATA:  Low back pain and numbness  EXAM: CT LUMBAR SPINE WITHOUT CONTRAST  TECHNIQUE: Multidetector CT imaging of the lumbar spine was performed without intravenous contrast administration. Multiplanar CT image reconstructions were also generated.  COMPARISON:  None.  FINDINGS: The vertebral body heights are maintained. The alignment is anatomic. The paravertebral soft tissues are normal. There is no fracture or static listhesis. There is no spondylolysis. The disc spaces are preserved. The visualized portion of the SI joints are unremarkable.  T12-L1: There is no significant disc bulge, foraminal stenosis or central canal stenosis.  L1-L2: There is no significant disc bulge, foraminal stenosis or central canal stenosis.  L2-L3: There is no significant disc bulge, foraminal stenosis or central canal stenosis.   L3-L4: There is no significant disc bulge, foraminal stenosis or central canal stenosis.  L4-L5: There is no significant disc bulge, foraminal stenosis or central canal stenosis.  L5-S1: There is no significant disc bulge, foraminal stenosis or central canal stenosis.  IMPRESSION: Normal CT of the lumbar spine.   Electronically Signed   By: Kathreen Devoid   On: 08/01/2014 01:02   Mr Brain W Wo Contrast  08/05/2014   ADDENDUM REPORT: 08/05/2014 12:20  ADDENDUM: In comparison with the subsequent CT, a right hilar adenopathy is evident. Posterior medial pleural disease is also present in the right lower lobe. This may correspond with an infectious etiology.  Although the CT scan is not typical of sarcoid, sarcoidosis should also be considered within the spine.  Electronically Signed   By: Lawrence Santiago M.D.   On: 08/05/2014 12:20   08/05/2014   CLINICAL DATA:  Abnormal MRI of the cervical spine. Fall 2 months ago. Neck injury. Back and neck pain. Positive Lhermitte's sign.  EXAM: MRI HEAD WITHOUT AND WITH CONTRAST  MRI CERVICAL SPINE WITH CONTRAST  MRI THORACIC SPINE WITHOUT AND WITH CONTRAST  TECHNIQUE: Multiplanar, multiecho pulse sequences of the brain and surrounding structures, and cervical and thoracic spine, to include the craniocervical junction , were obtained without and with intravenous contrast.  CONTRAST:  16mL MULTIHANCE GADOBENATE DIMEGLUMINE 529 MG/ML IV SOLN  COMPARISON:  MRI cervical spine from the same day.  FINDINGS: MRI HEAD FINDINGS  A focal area of subcortical T2 hyperintensity is present in the posterior right frontal lobe. Abnormal signal is present along the genu of the corpus callosum, left greater than right.  There is associated enhancement along the posterior genu of the corpus callosum left greater than right. No other significant focal enhancement is present.  There is subtle diffuse periventricular white matter change bilaterally without significant associated enhancement. Mild  dural enhancement is present throughout, most prominent over the left tentorium.  Flow is present in the major intracranial arteries. The globes and orbits are intact. Mild mucosal thickening is present in the left frontal sinus in the anterior ethmoid air cells bilaterally. The mastoid air cells are clear.  The brain is otherwise unremarkable. No acute infarct, hemorrhage, or mass lesion is present. The ventricles are of normal size.  MRI CERVICAL SPINE FINDINGS  The postcontrast images of the cervical spine demonstrate symmetric lateral enhancement from the craniocervical junction to the C1-2 level. There is asymmetric left-sided posterior lateral dural enhancement at the level of C4. Diffuse dorsal enhancement is evident from C5 through T2. This involves both the dorsal aspect of the cord and the dura. There is no pathologic enhancement of the vertebral bodies.  MRI THORACIC SPINE FINDINGS  There is diffuse cord signal abnormality in some expansion through the entire thoracic cord to the level of the conus medullaris which terminates at T12. Marrow signal, vertebral body heights, alignment are normal. Segment focal areas of dorsal cord in dural enhancement are present from T4 through T7 and again most notably from T9-10 through T11-12. There is some degree of diffuse dural enhancement throughout the thoracic spinal cord. Portions of the dorsal cord enhance as well.  There is no focal disc protrusion or central canal stenosis. The foramina are widely patent bilaterally.  IMPRESSION: 1. The findings are nonspecific but suggestive of ADEM. Given the extensive segmental areas of dural enhancement, an infectious myelitis must be considered. Neuromyelitis optica is considered. Without other etiology, this would be considered idiopathic acute transverse myelitis. Multiple sclerosis is considered less likely. This does not appear to be a neoplastic process. 2. Subtle T2 changes in the brain are less prominent than in the  thoracic spine and likely related to the same process. 3. Mild diffuse dural enhancement is present over the brain and spinal cord. 4. Focal enhancement is present along the up minimal surface of the left lateral ventricle as described. These results were called by telephone at the time of interpretation on 08/04/2014 at 8:51 pm to Dr. Wallie Char, who verbally acknowledged these results.  Electronically Signed: By: Lawrence Santiago M.D. On: 08/04/2014 20:52   Mr Cervical Spine Wo Contrast  08/04/2014   CLINICAL DATA:  Neck pain with weakness and numbness in both legs, left greater than right. Fall  in bathtub July, 2015.  EXAM: MRI CERVICAL SPINE WITHOUT CONTRAST  TECHNIQUE: Multiplanar, multisequence MR imaging of the cervical spine was performed. No intravenous contrast was administered.  COMPARISON:  08/01/2014 CT scan  FINDINGS: Prominent symmetric cord expansion and edema signal extending from the C2 level down through at least the T8 level is observed, with corresponding low T1 signal in this large segment of the cord. No middle or lower upon teen abnormal signal is observed.  The craniocervical junction appears unremarkable. No vertebral subluxation is observed. No significant vertebral marrow edema is identified. Additional findings at individual levels are as follows:  C2-3:  Unremarkable.  C3-4: Mild disc bulge slightly indents the anterior cord but only because the cord is expanded.  C4-5: Mild disc bulge indents the anterior cord but only because the cord is expanded.  C5-6: Mild disc bulge indents the anterior cord but only because the cord is expanded.  C6-7: Small central disc protrusion indents the anterior cord but only because the cord is expanded.  C7-T1:  Unremarkable.  We obtained a limited due view of the upper thoracic spine on sagittal images which included down to T8 on T2 weighted images. Please be aware that this does not constitute a diagnostic evaluation of the thoracic spine and  only gets a brief and unreliable look at the upper thoracic spine. This does appear to show the cord edema signal/expansion extending down at least to the T8 level.  IMPRESSION: 1. Prominent abnormal edema signal and expansion of the spinal cord extending from the C2 level at least down to the T8 level. Differential diagnostic considerations include HIV myelitis, postviral/post vaccination autoimmune myelitis, Devic's disease, or demyelinating process. Tumor seems unlikely given the extended involvement. Further imaging workup is recommended, to include MRI of the brain with and without contrast; MRI thoracic spine with and without contrast; and post-contrast images of the cervical spine for further characterization. Neurology consultation is also recommended. I have contacted by telephone the flow managers associated with the [facility] emergency room in order to track down and contact the patient and have the patient immediately proceed to the [facility] Room for further neurologic assessment and imaging workup.   Electronically Signed   By: Sherryl Barters M.D.   On: 08/04/2014 13:03   Mr Cervical Spine W Contrast  08/05/2014   ADDENDUM REPORT: 08/05/2014 12:20  ADDENDUM: In comparison with the subsequent CT, a right hilar adenopathy is evident. Posterior medial pleural disease is also present in the right lower lobe. This may correspond with an infectious etiology.  Although the CT scan is not typical of sarcoid, sarcoidosis should also be considered within the spine.   Electronically Signed   By: Lawrence Santiago M.D.   On: 08/05/2014 12:20   08/05/2014   CLINICAL DATA:  Abnormal MRI of the cervical spine. Fall 2 months ago. Neck injury. Back and neck pain. Positive Lhermitte's sign.  EXAM: MRI HEAD WITHOUT AND WITH CONTRAST  MRI CERVICAL SPINE WITH CONTRAST  MRI THORACIC SPINE WITHOUT AND WITH CONTRAST  TECHNIQUE: Multiplanar, multiecho pulse sequences of the brain and surrounding  structures, and cervical and thoracic spine, to include the craniocervical junction , were obtained without and with intravenous contrast.  CONTRAST:  12mL MULTIHANCE GADOBENATE DIMEGLUMINE 529 MG/ML IV SOLN  COMPARISON:  MRI cervical spine from the same day.  FINDINGS: MRI HEAD FINDINGS  A focal area of subcortical T2 hyperintensity is present in the posterior right frontal lobe. Abnormal signal is  present along the genu of the corpus callosum, left greater than right.  There is associated enhancement along the posterior genu of the corpus callosum left greater than right. No other significant focal enhancement is present.  There is subtle diffuse periventricular white matter change bilaterally without significant associated enhancement. Mild dural enhancement is present throughout, most prominent over the left tentorium.  Flow is present in the major intracranial arteries. The globes and orbits are intact. Mild mucosal thickening is present in the left frontal sinus in the anterior ethmoid air cells bilaterally. The mastoid air cells are clear.  The brain is otherwise unremarkable. No acute infarct, hemorrhage, or mass lesion is present. The ventricles are of normal size.  MRI CERVICAL SPINE FINDINGS  The postcontrast images of the cervical spine demonstrate symmetric lateral enhancement from the craniocervical junction to the C1-2 level. There is asymmetric left-sided posterior lateral dural enhancement at the level of C4. Diffuse dorsal enhancement is evident from C5 through T2. This involves both the dorsal aspect of the cord and the dura. There is no pathologic enhancement of the vertebral bodies.  MRI THORACIC SPINE FINDINGS  There is diffuse cord signal abnormality in some expansion through the entire thoracic cord to the level of the conus medullaris which terminates at T12. Marrow signal, vertebral body heights, alignment are normal. Segment focal areas of dorsal cord in dural enhancement are present from  T4 through T7 and again most notably from T9-10 through T11-12. There is some degree of diffuse dural enhancement throughout the thoracic spinal cord. Portions of the dorsal cord enhance as well.  There is no focal disc protrusion or central canal stenosis. The foramina are widely patent bilaterally.  IMPRESSION: 1. The findings are nonspecific but suggestive of ADEM. Given the extensive segmental areas of dural enhancement, an infectious myelitis must be considered. Neuromyelitis optica is considered. Without other etiology, this would be considered idiopathic acute transverse myelitis. Multiple sclerosis is considered less likely. This does not appear to be a neoplastic process. 2. Subtle T2 changes in the brain are less prominent than in the thoracic spine and likely related to the same process. 3. Mild diffuse dural enhancement is present over the brain and spinal cord. 4. Focal enhancement is present along the up minimal surface of the left lateral ventricle as described. These results were called by telephone at the time of interpretation on 08/04/2014 at 8:51 pm to Dr. Wallie Char, who verbally acknowledged these results.  Electronically Signed: By: Lawrence Santiago M.D. On: 08/04/2014 20:52   Mr Thoracic Spine W Wo Contrast  08/05/2014   ADDENDUM REPORT: 08/05/2014 12:20  ADDENDUM: In comparison with the subsequent CT, a right hilar adenopathy is evident. Posterior medial pleural disease is also present in the right lower lobe. This may correspond with an infectious etiology.  Although the CT scan is not typical of sarcoid, sarcoidosis should also be considered within the spine.   Electronically Signed   By: Lawrence Santiago M.D.   On: 08/05/2014 12:20   08/05/2014   CLINICAL DATA:  Abnormal MRI of the cervical spine. Fall 2 months ago. Neck injury. Back and neck pain. Positive Lhermitte's sign.  EXAM: MRI HEAD WITHOUT AND WITH CONTRAST  MRI CERVICAL SPINE WITH CONTRAST  MRI THORACIC SPINE WITHOUT AND  WITH CONTRAST  TECHNIQUE: Multiplanar, multiecho pulse sequences of the brain and surrounding structures, and cervical and thoracic spine, to include the craniocervical junction , were obtained without and with intravenous contrast.  CONTRAST:  79mL  MULTIHANCE GADOBENATE DIMEGLUMINE 529 MG/ML IV SOLN  COMPARISON:  MRI cervical spine from the same day.  FINDINGS: MRI HEAD FINDINGS  A focal area of subcortical T2 hyperintensity is present in the posterior right frontal lobe. Abnormal signal is present along the genu of the corpus callosum, left greater than right.  There is associated enhancement along the posterior genu of the corpus callosum left greater than right. No other significant focal enhancement is present.  There is subtle diffuse periventricular white matter change bilaterally without significant associated enhancement. Mild dural enhancement is present throughout, most prominent over the left tentorium.  Flow is present in the major intracranial arteries. The globes and orbits are intact. Mild mucosal thickening is present in the left frontal sinus in the anterior ethmoid air cells bilaterally. The mastoid air cells are clear.  The brain is otherwise unremarkable. No acute infarct, hemorrhage, or mass lesion is present. The ventricles are of normal size.  MRI CERVICAL SPINE FINDINGS  The postcontrast images of the cervical spine demonstrate symmetric lateral enhancement from the craniocervical junction to the C1-2 level. There is asymmetric left-sided posterior lateral dural enhancement at the level of C4. Diffuse dorsal enhancement is evident from C5 through T2. This involves both the dorsal aspect of the cord and the dura. There is no pathologic enhancement of the vertebral bodies.  MRI THORACIC SPINE FINDINGS  There is diffuse cord signal abnormality in some expansion through the entire thoracic cord to the level of the conus medullaris which terminates at T12. Marrow signal, vertebral body heights,  alignment are normal. Segment focal areas of dorsal cord in dural enhancement are present from T4 through T7 and again most notably from T9-10 through T11-12. There is some degree of diffuse dural enhancement throughout the thoracic spinal cord. Portions of the dorsal cord enhance as well.  There is no focal disc protrusion or central canal stenosis. The foramina are widely patent bilaterally.  IMPRESSION: 1. The findings are nonspecific but suggestive of ADEM. Given the extensive segmental areas of dural enhancement, an infectious myelitis must be considered. Neuromyelitis optica is considered. Without other etiology, this would be considered idiopathic acute transverse myelitis. Multiple sclerosis is considered less likely. This does not appear to be a neoplastic process. 2. Subtle T2 changes in the brain are less prominent than in the thoracic spine and likely related to the same process. 3. Mild diffuse dural enhancement is present over the brain and spinal cord. 4. Focal enhancement is present along the up minimal surface of the left lateral ventricle as described. These results were called by telephone at the time of interpretation on 08/04/2014 at 8:51 pm to Dr. Wallie Char, who verbally acknowledged these results.  Electronically Signed: By: Lawrence Santiago M.D. On: 08/04/2014 20:52    Scheduled Meds: . enoxaparin (LOVENOX) injection  40 mg Subcutaneous Q24H  . methylPREDNISolone (SOLU-MEDROL) injection  500 mg Intravenous Q12H  . pantoprazole (PROTONIX) IV  40 mg Intravenous Q24H  . polyethylene glycol  17 g Oral Daily  . polyethylene glycol  17 g Oral Daily   Continuous Infusions:   Principal Problem:   Myelitis Active Problems:   Asthma, chronic   Solitary pulmonary nodule   Pleural nodule   Mediastinal lymphadenopathy    Time spent: 40 minutes   [facility] Pager [phone]. If 7PM-7AM, please contact night-coverage at [email or web address], password  [facility] 08/06/2014, 10:20 AM  LOS: 2 days

## 2014-08-06 NOTE — Progress Notes (Addendum)
CRITICAL VALUE ALERT  Critical value received:  CSF WBC COUNT 310  Date of notification:08/06/2014    Time of notification:  7564  Critical value read back:Yes.    Nurse who received alert:  Conley Rolls RN  MD notified (1st page):  ABROL MD  Time of first page:  1420  MD notified (2nd page):[facility] MD  Time of second page: 1428  Responding MD:  Allyson Sabal MD  Time MD responded:  3329

## 2014-08-06 NOTE — Progress Notes (Signed)
Subjective: No changes overnight, neck feels better  Exam: Filed Vitals:   08/06/14 1748  BP: 131/70  Pulse: 84  Temp: 98.5 F (36.9 C)  Resp: 18   Gen: In bed, NAD MS: Awake, alert, oriented, appropriate CN: Pupils equal round and reactive, extraocular movements intact Motor: Moves all extremity as well Sensory: Decreased to pinprick distally   ANA positive, reflex pending Angiotensin-converting enzyme-negative ESR, CRP-negative Vitamin E-pending HIV antibody-negative ENA-negative(includes DS DAN, SSA, SSB scl70, smith) Rheumatoid factor-negative Neuromyelitis optica antibody-pending RPR-negative ANCA  -  pending  Lyme titers-negative Cardiolipin antibodies is pending vitamin E-pending EBV-pending  HTLV I + II [place] virus - pending NMO pending  CSF:  WBC 310 RBC 4 Protein 114 Glucose 85 MTB by pcr HSV CMV Enterovirus  Impression: 33 year old male with myelitis of unclear etiology. Differential diagnosis includes multiple sclerosis, neuromyelitis optica, infectious myelitis, autoimmune disease, metabolic myelopathy. The longitudinally extensive nature of this as well as dural involvement  makes me concerned for autoimmune etiology, labs are pending.The time course would be very unusual for infectious etiology given that has been present now for months.    Recommendations: 1) lumbar puncture with CSF for studies as previously outlined  2) continue solumedrol 500 mg twice a day  3) will continue to follow.   Roland Rack, MD [facility] [phone]  If 7pm- 7am, please page neurology on call as listed in [place].

## 2014-08-06 NOTE — Progress Notes (Signed)
OT Cancellation Note and Discharge  Patient Details Name: DAVIDE RISDON MRN: [id number] DOB: Mar 26, 1981   Cancelled Treatment:    Reason Eval/Treat Not Completed: OT screened, no needs identified, will sign off. Pt independent with PT earlier with mobility and balance and in talking with pt he reports that he is currently not having any issue with BADLs or IADLs. No OT needs identified.  Almon Register [phone] 08/06/2014, 3:24 PM

## 2014-08-06 NOTE — Consult Note (Signed)
Name: Gregory Fuller MRN: [id number] DOB: Nov 17, 1980    ADMISSION DATE:  08/04/2014 CONSULTATION DATE:  10/1  REFERRING MD :  [facility]  CHIEF COMPLAINT:  Back pain  BRIEF PATIENT DESCRIPTION:  NAD AAM with back/neck pain  SIGNIFICANT EVENTS  9/30 ana + 10/1 attempted LP  STUDIES:  10/1 CT chest: RT hilar adenopathy Rt irregular pleural based lesions 4 mm nodule 9/30 HIV neg  HISTORY OF PRESENT ILLNESS:   33 yo AAM with known asthma(prn albuterol MDI) occasional tobacco user along with recreational marijuana use who fell in tub 2 months ago and struck his back/neck without loss of consciousness .  He notes neck/back pain treated with steroids per urgent care. He improved for a time but symptoms have returned.He was evaluated by Neuro in ED and MRI of brain and spine with concern for myelitis which was treated with steroids and he has been admitted for further evaluation. Note his MRI demonstrated Posterior pleural disease RL and right hilar adenopathy.  He is scheduled for lumbar puncture per Neurology and PCCM asked to evaluate for possible FOB with bx.   SUBJECTIVE:  [place]  VITAL SIGNS: Temp:  [98 F (36.7 C)-99.2 F (37.3 C)] 99.2 F (37.3 C) (10/02 0945) Pulse Rate:  [75-96] 75 (10/02 0945) Resp:  [18-20] 18 (10/02 0945) BP: (130-161)/(63-88) 161/84 mmHg (10/02 0945) SpO2:  [96 %-100 %] 100 % (10/02 0945)  PHYSICAL EXAMINATION: General:  WNWD AAM . No acute distress, walking around room Neuro:  MAE x 4, weakness and numbness improved with steroids HEENT:  No LAN/JVD, oral pharynx unremarkable Cardiovascular: HSR RRR Lungs:  CTA Abdomen: Soft NT Musculoskeletal:  Intact Skin: warm and dry   Recent Labs Lab 08/01/14 0227 08/04/14 1627 08/05/14 0730  NA 138 140 137  K 4.1 4.1 4.4  CL 103 102 101  CO2  --  27 23  BUN 18 11 11   CREATININE 1.20 1.16 1.04  GLUCOSE 111* 88 134*    Recent Labs Lab 08/01/14 0227 08/04/14 1627 08/05/14 0730  HGB 15.3 14.9  14.9  HCT 45.0 42.0 42.4  WBC  --  6.0 6.3  PLT  --  236 236   Dg Chest 2 View  08/04/2014   CLINICAL DATA:  Abnormal C-spine MRI. Differential considerations include sarcoidosis.  EXAM: CHEST  2 VIEW  COMPARISON:  None.  FINDINGS: Lungs are essentially clear. No focal consolidation. No pleural effusion or pneumothorax.  The heart is normal in size.  Mild prominence of the bilateral pulmonary hila. Adenopathy is possible. Right paratracheal stripe is not enlarged.  Visualized osseous structures are within normal limits.  IMPRESSION: Mild prominence of the bilateral pulmonary hila, possibly reflecting hilar adenopathy, although equivocal.  If further imaging evaluation is desired, consider CT chest with contrast.  Otherwise, no evidence of acute cardiopulmonary disease.   Electronically Signed   By: Julian Hy M.D.   On: 08/04/2014 18:10   Ct Chest W Contrast  08/05/2014   CLINICAL DATA:  Hilar prominence on chest radiates atrophy, query adenopathy. Query sarcoidosis. The patient has a encephalomyelitis based on narrow imaging.  EXAM: CT CHEST WITH CONTRAST  TECHNIQUE: Multidetector CT imaging of the chest was performed during intravenous contrast administration.  CONTRAST:  34mL OMNIPAQUE IOHEXOL 300 MG/ML  SOLN  COMPARISON:  Multiple exams, including 08/04/2014  FINDINGS: CT confirms right hilar and infrahilar adenopathy, with a right posterior hilar node measuring 1.7 cm in short axis and a right infrahilar node measuring 1.6 cm  in short axis on images 25 and 27, respectively. Additional enlarged infrahilar and hilar nodes are present on the right side. No adenopathy on the left or in the remainder the mediastinum. No axillary or internal mammary adenopathy. No definite regional endobronchial lesion or significant extrinsic narrowing of the airways.  Peripherally in the left lower lobe there is a pleural based slightly irregularly marginated lesion on image 43 of series 3 measuring 1.4 cm at its  pleural base and 0.7 cm in height. Adjacent to this is a flat but slightly irregular 2.8 by 0.5 cm subpleural lesion. There is a 4 mm left lower lobe nodule on image 35 of series 3 which appears noncalcified.  No definite nodular airway thickening.  IMPRESSION: 1. Pathologic right hilar and infrahilar adenopathy but without left hilar or mediastinal adenopathy, and with slightly irregular pleural based lesions medially in the right lower lobe in this patient with known segmental regions of dural enhancement and myelitis throughout the spinal cord and in portions of the brain. A unifying diagnosis is problematic as the imaging appearance is not considered highly characteristic for a specific disease process. The unilateral hilar adenopathy and lack of nodular airway thickening would be unusual for sarcoidosis with neurosarcoidosis as the underlying cause, but it would also be unusual for this to represent a non-small cell lung cancer with dural metastatic disease and paraneoplastic myelitis and cerebritis given the patient's young age. Correlation with serum ACE levels suggested in working up the sarcoidosis angle although these are not entirely specific. Bronchoscopic biopsy of the lymph nodes may provide useful diagnostic information. With regard to the pleural-based lesions, I am uncertain whether these are true lesions or simply represent regional atelectasis. There is an adjacent 4 mm nodule in the right lower lobe, too small to biopsy percutaneously. Entities such as right-sided tuberculosis with TB involvement of the derived likewise seem unlikely.   Electronically Signed   By: Sherryl Barters M.D.   On: 08/05/2014 10:44   Mr Jeri Cos VC Contrast  08/05/2014   ADDENDUM REPORT: 08/05/2014 12:20  ADDENDUM: In comparison with the subsequent CT, a right hilar adenopathy is evident. Posterior medial pleural disease is also present in the right lower lobe. This may correspond with an infectious etiology.   Although the CT scan is not typical of sarcoid, sarcoidosis should also be considered within the spine.   Electronically Signed   By: Lawrence Santiago M.D.   On: 08/05/2014 12:20   08/05/2014   CLINICAL DATA:  Abnormal MRI of the cervical spine. Fall 2 months ago. Neck injury. Back and neck pain. Positive Lhermitte's sign.  EXAM: MRI HEAD WITHOUT AND WITH CONTRAST  MRI CERVICAL SPINE WITH CONTRAST  MRI THORACIC SPINE WITHOUT AND WITH CONTRAST  TECHNIQUE: Multiplanar, multiecho pulse sequences of the brain and surrounding structures, and cervical and thoracic spine, to include the craniocervical junction , were obtained without and with intravenous contrast.  CONTRAST:  25mL MULTIHANCE GADOBENATE DIMEGLUMINE 529 MG/ML IV SOLN  COMPARISON:  MRI cervical spine from the same day.  FINDINGS: MRI HEAD FINDINGS  A focal area of subcortical T2 hyperintensity is present in the posterior right frontal lobe. Abnormal signal is present along the genu of the corpus callosum, left greater than right.  There is associated enhancement along the posterior genu of the corpus callosum left greater than right. No other significant focal enhancement is present.  There is subtle diffuse periventricular white matter change bilaterally without significant associated enhancement. Mild dural enhancement is  present throughout, most prominent over the left tentorium.  Flow is present in the major intracranial arteries. The globes and orbits are intact. Mild mucosal thickening is present in the left frontal sinus in the anterior ethmoid air cells bilaterally. The mastoid air cells are clear.  The brain is otherwise unremarkable. No acute infarct, hemorrhage, or mass lesion is present. The ventricles are of normal size.  MRI CERVICAL SPINE FINDINGS  The postcontrast images of the cervical spine demonstrate symmetric lateral enhancement from the craniocervical junction to the C1-2 level. There is asymmetric left-sided posterior lateral dural  enhancement at the level of C4. Diffuse dorsal enhancement is evident from C5 through T2. This involves both the dorsal aspect of the cord and the dura. There is no pathologic enhancement of the vertebral bodies.  MRI THORACIC SPINE FINDINGS  There is diffuse cord signal abnormality in some expansion through the entire thoracic cord to the level of the conus medullaris which terminates at T12. Marrow signal, vertebral body heights, alignment are normal. Segment focal areas of dorsal cord in dural enhancement are present from T4 through T7 and again most notably from T9-10 through T11-12. There is some degree of diffuse dural enhancement throughout the thoracic spinal cord. Portions of the dorsal cord enhance as well.  There is no focal disc protrusion or central canal stenosis. The foramina are widely patent bilaterally.  IMPRESSION: 1. The findings are nonspecific but suggestive of ADEM. Given the extensive segmental areas of dural enhancement, an infectious myelitis must be considered. Neuromyelitis optica is considered. Without other etiology, this would be considered idiopathic acute transverse myelitis. Multiple sclerosis is considered less likely. This does not appear to be a neoplastic process. 2. Subtle T2 changes in the brain are less prominent than in the thoracic spine and likely related to the same process. 3. Mild diffuse dural enhancement is present over the brain and spinal cord. 4. Focal enhancement is present along the up minimal surface of the left lateral ventricle as described. These results were called by telephone at the time of interpretation on 08/04/2014 at 8:51 pm to Dr. Wallie Char, who verbally acknowledged these results.  Electronically Signed: By: Lawrence Santiago M.D. On: 08/04/2014 20:52   Mr Cervical Spine Wo Contrast  08/04/2014   CLINICAL DATA:  Neck pain with weakness and numbness in both legs, left greater than right. Fall in bathtub July, 2015.  EXAM: MRI CERVICAL SPINE  WITHOUT CONTRAST  TECHNIQUE: Multiplanar, multisequence MR imaging of the cervical spine was performed. No intravenous contrast was administered.  COMPARISON:  08/01/2014 CT scan  FINDINGS: Prominent symmetric cord expansion and edema signal extending from the C2 level down through at least the T8 level is observed, with corresponding low T1 signal in this large segment of the cord. No middle or lower upon teen abnormal signal is observed.  The craniocervical junction appears unremarkable. No vertebral subluxation is observed. No significant vertebral marrow edema is identified. Additional findings at individual levels are as follows:  C2-3:  Unremarkable.  C3-4: Mild disc bulge slightly indents the anterior cord but only because the cord is expanded.  C4-5: Mild disc bulge indents the anterior cord but only because the cord is expanded.  C5-6: Mild disc bulge indents the anterior cord but only because the cord is expanded.  C6-7: Small central disc protrusion indents the anterior cord but only because the cord is expanded.  C7-T1:  Unremarkable.  We obtained a limited due view of the upper thoracic spine on  sagittal images which included down to T8 on T2 weighted images. Please be aware that this does not constitute a diagnostic evaluation of the thoracic spine and only gets a brief and unreliable look at the upper thoracic spine. This does appear to show the cord edema signal/expansion extending down at least to the T8 level.  IMPRESSION: 1. Prominent abnormal edema signal and expansion of the spinal cord extending from the C2 level at least down to the T8 level. Differential diagnostic considerations include HIV myelitis, postviral/post vaccination autoimmune myelitis, Devic's disease, or demyelinating process. Tumor seems unlikely given the extended involvement. Further imaging workup is recommended, to include MRI of the brain with and without contrast; MRI thoracic spine with and without contrast; and  post-contrast images of the cervical spine for further characterization. Neurology consultation is also recommended. I have contacted by telephone the flow managers associated with the [facility] emergency room in order to track down and contact the patient and have the patient immediately proceed to the [facility] Emergency Room for further neurologic assessment and imaging workup.   Electronically Signed   By: Sherryl Barters M.D.   On: 08/04/2014 13:03   Mr Cervical Spine W Contrast  08/05/2014   ADDENDUM REPORT: 08/05/2014 12:20  ADDENDUM: In comparison with the subsequent CT, a right hilar adenopathy is evident. Posterior medial pleural disease is also present in the right lower lobe. This may correspond with an infectious etiology.  Although the CT scan is not typical of sarcoid, sarcoidosis should also be considered within the spine.   Electronically Signed   By: Lawrence Santiago M.D.   On: 08/05/2014 12:20   08/05/2014   CLINICAL DATA:  Abnormal MRI of the cervical spine. Fall 2 months ago. Neck injury. Back and neck pain. Positive Lhermitte's sign.  EXAM: MRI HEAD WITHOUT AND WITH CONTRAST  MRI CERVICAL SPINE WITH CONTRAST  MRI THORACIC SPINE WITHOUT AND WITH CONTRAST  TECHNIQUE: Multiplanar, multiecho pulse sequences of the brain and surrounding structures, and cervical and thoracic spine, to include the craniocervical junction , were obtained without and with intravenous contrast.  CONTRAST:  19mL MULTIHANCE GADOBENATE DIMEGLUMINE 529 MG/ML IV SOLN  COMPARISON:  MRI cervical spine from the same day.  FINDINGS: MRI HEAD FINDINGS  A focal area of subcortical T2 hyperintensity is present in the posterior right frontal lobe. Abnormal signal is present along the genu of the corpus callosum, left greater than right.  There is associated enhancement along the posterior genu of the corpus callosum left greater than right. No other significant focal enhancement is present.  There is subtle diffuse  periventricular white matter change bilaterally without significant associated enhancement. Mild dural enhancement is present throughout, most prominent over the left tentorium.  Flow is present in the major intracranial arteries. The globes and orbits are intact. Mild mucosal thickening is present in the left frontal sinus in the anterior ethmoid air cells bilaterally. The mastoid air cells are clear.  The brain is otherwise unremarkable. No acute infarct, hemorrhage, or mass lesion is present. The ventricles are of normal size.  MRI CERVICAL SPINE FINDINGS  The postcontrast images of the cervical spine demonstrate symmetric lateral enhancement from the craniocervical junction to the C1-2 level. There is asymmetric left-sided posterior lateral dural enhancement at the level of C4. Diffuse dorsal enhancement is evident from C5 through T2. This involves both the dorsal aspect of the cord and the dura. There is no pathologic enhancement of the vertebral bodies.  MRI THORACIC  SPINE FINDINGS  There is diffuse cord signal abnormality in some expansion through the entire thoracic cord to the level of the conus medullaris which terminates at T12. Marrow signal, vertebral body heights, alignment are normal. Segment focal areas of dorsal cord in dural enhancement are present from T4 through T7 and again most notably from T9-10 through T11-12. There is some degree of diffuse dural enhancement throughout the thoracic spinal cord. Portions of the dorsal cord enhance as well.  There is no focal disc protrusion or central canal stenosis. The foramina are widely patent bilaterally.  IMPRESSION: 1. The findings are nonspecific but suggestive of ADEM. Given the extensive segmental areas of dural enhancement, an infectious myelitis must be considered. Neuromyelitis optica is considered. Without other etiology, this would be considered idiopathic acute transverse myelitis. Multiple sclerosis is considered less likely. This does not  appear to be a neoplastic process. 2. Subtle T2 changes in the brain are less prominent than in the thoracic spine and likely related to the same process. 3. Mild diffuse dural enhancement is present over the brain and spinal cord. 4. Focal enhancement is present along the up minimal surface of the left lateral ventricle as described. These results were called by telephone at the time of interpretation on 08/04/2014 at 8:51 pm to Dr. Wallie Char, who verbally acknowledged these results.  Electronically Signed: By: Lawrence Santiago M.D. On: 08/04/2014 20:52   Mr Thoracic Spine W Wo Contrast  08/05/2014   ADDENDUM REPORT: 08/05/2014 12:20  ADDENDUM: In comparison with the subsequent CT, a right hilar adenopathy is evident. Posterior medial pleural disease is also present in the right lower lobe. This may correspond with an infectious etiology.  Although the CT scan is not typical of sarcoid, sarcoidosis should also be considered within the spine.   Electronically Signed   By: Lawrence Santiago M.D.   On: 08/05/2014 12:20   08/05/2014   CLINICAL DATA:  Abnormal MRI of the cervical spine. Fall 2 months ago. Neck injury. Back and neck pain. Positive Lhermitte's sign.  EXAM: MRI HEAD WITHOUT AND WITH CONTRAST  MRI CERVICAL SPINE WITH CONTRAST  MRI THORACIC SPINE WITHOUT AND WITH CONTRAST  TECHNIQUE: Multiplanar, multiecho pulse sequences of the brain and surrounding structures, and cervical and thoracic spine, to include the craniocervical junction , were obtained without and with intravenous contrast.  CONTRAST:  30mL MULTIHANCE GADOBENATE DIMEGLUMINE 529 MG/ML IV SOLN  COMPARISON:  MRI cervical spine from the same day.  FINDINGS: MRI HEAD FINDINGS  A focal area of subcortical T2 hyperintensity is present in the posterior right frontal lobe. Abnormal signal is present along the genu of the corpus callosum, left greater than right.  There is associated enhancement along the posterior genu of the corpus callosum left  greater than right. No other significant focal enhancement is present.  There is subtle diffuse periventricular white matter change bilaterally without significant associated enhancement. Mild dural enhancement is present throughout, most prominent over the left tentorium.  Flow is present in the major intracranial arteries. The globes and orbits are intact. Mild mucosal thickening is present in the left frontal sinus in the anterior ethmoid air cells bilaterally. The mastoid air cells are clear.  The brain is otherwise unremarkable. No acute infarct, hemorrhage, or mass lesion is present. The ventricles are of normal size.  MRI CERVICAL SPINE FINDINGS  The postcontrast images of the cervical spine demonstrate symmetric lateral enhancement from the craniocervical junction to the C1-2 level. There is asymmetric left-sided posterior  lateral dural enhancement at the level of C4. Diffuse dorsal enhancement is evident from C5 through T2. This involves both the dorsal aspect of the cord and the dura. There is no pathologic enhancement of the vertebral bodies.  MRI THORACIC SPINE FINDINGS  There is diffuse cord signal abnormality in some expansion through the entire thoracic cord to the level of the conus medullaris which terminates at T12. Marrow signal, vertebral body heights, alignment are normal. Segment focal areas of dorsal cord in dural enhancement are present from T4 through T7 and again most notably from T9-10 through T11-12. There is some degree of diffuse dural enhancement throughout the thoracic spinal cord. Portions of the dorsal cord enhance as well.  There is no focal disc protrusion or central canal stenosis. The foramina are widely patent bilaterally.  IMPRESSION: 1. The findings are nonspecific but suggestive of ADEM. Given the extensive segmental areas of dural enhancement, an infectious myelitis must be considered. Neuromyelitis optica is considered. Without other etiology, this would be considered  idiopathic acute transverse myelitis. Multiple sclerosis is considered less likely. This does not appear to be a neoplastic process. 2. Subtle T2 changes in the brain are less prominent than in the thoracic spine and likely related to the same process. 3. Mild diffuse dural enhancement is present over the brain and spinal cord. 4. Focal enhancement is present along the up minimal surface of the left lateral ventricle as described. These results were called by telephone at the time of interpretation on 08/04/2014 at 8:51 pm to Dr. Wallie Char, who verbally acknowledged these results.  Electronically Signed: By: Lawrence Santiago M.D. On: 08/04/2014 20:52    ASSESSMENT   Myelitis   Asthma, chronic Discussion: 33 yo AAM with known asthma(prn albuterol MDI) occasional tobacco user along with recreational marijuana use who fell in tub 2 months ago and struck his back/neck without loss of consciousness .  He notes neck/back pain treated with steroids per urgent care. He improved for a time but symptoms have returned.He was evaluated by Neuro in ED and MRI of brain and spine with concern for myelitis which was treated with steroids and he has been admitted for further evaluation. Note his MRI demonstrated Posterior pleural disease RL and right hilar adenopathy.  He is scheduled for lumbar puncture per Neurology and PCCM asked to evaluate for possible FOB with bx.   PLAN: He is not likely to have a malignancy at age 24. Agree with lumbar puncture per IR 10/2 MD to evaluate for role of FOB with Bx.  further ID work up.  Patient with no medical history presenting with total spinal cord myelitis.  DDx is sarcoid vs lupus, infectious etiology are unlikely to involve lung as well.  All auto-immune serologies are sent and pending.  No need for bronch and biopsy for now.  LP to be done today and will await cytology.  If cytology is negative then will consider EBUS.  Given neuro symptoms, if LP is clean from  infectious etiology then sooner rather than alter starting steroids would be the next course of action.  I would recommend involving rheumatology in this case given the clinical picture.  PCCM will check back on Monday  [facility] Minor ACNP Maryanna Shape PCCM Pager [phone] till 3 pm If no answer page [phone] 08/06/2014, 9:54 AM   Patient seen and examined, agree with above note.  I dictated the care and orders written for this patient under my direction.  Rush Farmer, MD [phone]

## 2014-08-07 LAB — HTLV I+II ANTIBODIES, (EIA), BLD: HTLV I/II AB: NONREACTIVE

## 2014-08-07 NOTE — Progress Notes (Signed)
Subjective: No changes overnight, neck feels better  Exam: Filed Vitals:   08/07/14 1800  BP: 143/75  Pulse: 76  Temp: 98.4 F (36.9 C)  Resp: 18   Gen: In bed, NAD MS: Awake, alert, oriented, appropriate CN: Pupils equal round and reactive, extraocular movements intact Motor: Moves all extremity as well Sensory: Decreased to pinprick distally   ANA positive, reflex pending Angiotensin-converting enzyme-negative ESR, CRP-negative Vitamin E-pending HIV antibody-negative ENA-negative(includes DS DAN, SSA, SSB scl70, smith) Rheumatoid factor-negative Neuromyelitis optica antibody-pending RPR-negative ANCA  -  c-anca positive  Lyme titers-negative Cardiolipin antibodies is pending vitamin E-pending EBV-pending  HTLV I + II [place] virus - pending NMO pending  CSF:  WBC 310 RBC 4 Protein 114 Glucose 85 MTB by pcr HSV CMV Enterovirus  Impression: 33 year old male with myelitis of unclear etiology. Differential diagnosis includes multiple sclerosis, neuromyelitis optica, infectious myelitis, autoimmune disease, metabolic myelopathy. The longitudinally extensive nature of this as well as dural involvement  makes me concerned for autoimmune etiology, labs are pending.The time course would be very unusual for infectious etiology given that has been present now for months.   The presence of c-ANCA is slightly difficult to interpret. He does not have clear findings of vasculitis. One consideration would be to ensure that this is not a false positive with elisa confirmation   Recommendations: 1) continue solumedrol 500 mg twice a day day 4/5 2) followup labs  Roland Rack, MD [facility] [phone]  If 7pm- 7am, please page neurology on call as listed in [place].

## 2014-08-07 NOTE — Progress Notes (Addendum)
[facility] PROGRESS NOTE  JAQUANE BOUGHNER BDZ:[id number] DOB: 11/01/1981 DOA: 08/04/2014 PCP: No PCP Per Patient  Assessment/Plan: Principal Problem:   Myelitis Active Problems:   Asthma, chronic   Solitary pulmonary nodule   Pleural nodule   Mediastinal lymphadenopathy      Myelitis  33 year old male with myelitis of unclear etiology. Differential diagnosis includes multiple sclerosis, neuromyelitis optica, infectious myelitis, autoimmune disease, metabolic myelopathy.  .  Extensive workup ordered by neurology  ANA positive, reflex pending  Angiotensin-converting enzyme-negative  ESR, CRP-negative  Vitamin E-pending  HIV antibody-negative  ENA-negative(includes DS DAN, SSA, SSB scl70, smith)  Rheumatoid factor-negative  Neuromyelitis optica antibody-pending  RPR-negative  ANCA - pending  Lyme titers-negative  Cardiolipin antibodies is pending  vitamin E-pending  EBV-pending  HTLV I + II  [place] virus - pending  NMO pending  CSF:  WBC 310 (no suspicion for infection as per Dr. Katherine Roan) RBC 4  Protein 114  Glucose 85  MTB by pcr  HSV  CMV  Enterovirus     MRI brain with/without contrast, MRI c-spine w/contrast, MRI T-spine w/wo contrast findings suggest ADEM   Continue Solumedrol 57m BID per neurology     Hilar adenopathy  Pathologic right hilar and infrahilar adenopathy , normal LDH Not likely to have a malignancy per pulmonary, no indication for bronchoscopy at this time  Await CSF cytology , flow cytometry Question sarcoidosis,non-small cell lung cancer with dural metastatic disease and paraneoplastic myelitis  Appreciate pulmonary input    Constipation  Started on MiraLAX   DVT On  Lovenox for DVT prophylaxis    Code Status: full  Family Communication: family updated about patient's clinical progress  Disposition Plan: Per neurology    Brief narrative:  TJAXEN SAMPLESis an 33y.o. male who fell in the tube 2 months ago. He  noted he slipped and when he landed he landed on his neck. He did not lose consciousness. A few days after his fall he noted both back and neck pain. He also noted when he looked down he had a electrical shock like sen sensation that went down his back. He seeked medical attention and was given a shot of steroids which seemed to help. A few days later his pain returned and he went to urgent care where he was give a 10 mg steroid dose pack. This seemed to help the pain, but again when he finished the dose pack his neck pain returned. Patient obtained a MRI of cervical spine today which demonstrated abnormal cord signal from C2-T8.  Consultants:  Neurology  Pulmonary  Procedures:  Lumbar puncture Antibiotics:  None      HPI/Subjective: Ambulating, complaining of " heaviness in both legs"  Objective: Filed Vitals:   08/06/14 2120 08/07/14 0104 08/07/14 0607 08/07/14 0943  BP: 149/82 108/66 131/55 144/70  Pulse: 68 67 71 73  Temp: 98.6 F (37 C) 97.7 F (36.5 C) 97.9 F (36.6 C) 98.6 F (37 C)  TempSrc: Oral Oral Oral Oral  Resp: _0 Height:      Weight:      SpO2: 100% 99% 99% 96%    Intake/Output Summary (Last 24 hours) at 08/07/14 1019 Last data filed at 08/06/14 1802  Gross per 24 hour  Intake    480 ml  Output      0 ml  Net    480 ml    Exam:  Gen: In bed, NAD  MS: Awake, alert, oriented, appropriate  CN: Pupils equal round and reactive, extraocular movements intact  Motor: Moves all extremity as well  Sensory: Decreased to pinprick distally          Data Reviewed: Basic Metabolic Panel:  Recent Labs Lab 08/01/14 0227 08/04/14 1627 08/05/14 0730  NA 138 140 137  K 4.1 4.1 4.4  CL 103 102 101  CO2  --  27 23  GLUCOSE 111* 88 134*  BUN _0 CREATININE 1.20 1.16 1.04  CALCIUM  --  10.1 10.0    Liver Function Tests:  Recent Labs Lab 08/04/14 1627 08/05/14 0730  AST 21 17  ALT 29 25  ALKPHOS 71 69  BILITOT 0.6 0.8  PROT  8.3 8.4*  ALBUMIN 4.4 4.3   No results found for this basename: LIPASE, AMYLASE,  in the last 168 hours No results found for this basename: AMMONIA,  in the last 168 hours  CBC:  Recent Labs Lab 08/01/14 0227 08/04/14 1627 08/05/14 0730  WBC  --  6.0 6.3  NEUTROABS  --  3.6 5.6  HGB 15.3 14.9 14.9  HCT 45.0 42.0 42.4  MCV  --  78.9 77.1*  PLT  --  236 236    Cardiac Enzymes: No results found for this basename: CKTOTAL, CKMB, CKMBINDEX, TROPONINI,  in the last 168 hours BNP (last 3 results) No results found for this basename: PROBNP,  in the last 8760 hours   CBG: No results found for this basename: GLUCAP,  in the last 168 hours  Recent Results (from the past 240 hour(s))  CSF CULTURE     Status: None   Collection Time    08/06/14 11:55 AM      Result Value Ref Range Status   Specimen Description CSF   Final   Special Requests 5.0ML FLUID   Final   Gram Stain     Final   Value: WBC PRESENT, PREDOMINANTLY MONONUCLEAR     NO ORGANISMS SEEN     CYTOSPIN Performed at [facility]     Performed at [facility]   Culture PENDING   Incomplete   Report Status PENDING   Incomplete  GRAM STAIN     Status: None   Collection Time    08/06/14 11:55 AM      Result Value Ref Range Status   Specimen Description CSF   Final   Special Requests 5.0ML FLUID   Final   Gram Stain     Final   Value: CYTOSPIN PREP     WBC PRESENT, PREDOMINANTLY MONONUCLEAR     NO ORGANISMS SEEN   Report Status 08/06/2014 FINAL   Final     Studies: Dg Chest 2 View  08/04/2014   CLINICAL DATA:  Abnormal C-spine MRI. Differential considerations include sarcoidosis.  EXAM: CHEST  2 VIEW  COMPARISON:  None.  FINDINGS: Lungs are essentially clear. No focal consolidation. No pleural effusion or pneumothorax.  The heart is normal in size.  Mild prominence of the bilateral pulmonary hila. Adenopathy is possible. Right paratracheal stripe is not enlarged.  Visualized osseous structures are  within normal limits.  IMPRESSION: Mild prominence of the bilateral pulmonary hila, possibly reflecting hilar adenopathy, although equivocal.  If further imaging evaluation is desired, consider CT chest with contrast.  Otherwise, no evidence of acute cardiopulmonary disease.   Electronically Signed   By: Julian Hy M.D.   On: 08/04/2014 18:10   Ct Chest W Contrast  08/05/2014   CLINICAL DATA:  Hilar prominence on chest radiates atrophy, query adenopathy. Query sarcoidosis. The patient has a encephalomyelitis based on narrow imaging.  EXAM: CT CHEST WITH CONTRAST  TECHNIQUE: Multidetector CT imaging of the chest was performed during intravenous contrast administration.  CONTRAST:  30m OMNIPAQUE IOHEXOL 300 MG/ML  SOLN  COMPARISON:  Multiple exams, including 08/04/2014  FINDINGS: CT confirms right hilar and infrahilar adenopathy, with a right posterior hilar node measuring 1.7 cm in short axis and a right infrahilar node measuring 1.6 cm in short axis on images 25 and 27, respectively. Additional enlarged infrahilar and hilar nodes are present on the right side. No adenopathy on the left or in the remainder the mediastinum. No axillary or internal mammary adenopathy. No definite regional endobronchial lesion or significant extrinsic narrowing of the airways.  Peripherally in the left lower lobe there is a pleural based slightly irregularly marginated lesion on image 43 of series 3 measuring 1.4 cm at its pleural base and 0.7 cm in height. Adjacent to this is a flat but slightly irregular 2.8 by 0.5 cm subpleural lesion. There is a 4 mm left lower lobe nodule on image 35 of series 3 which appears noncalcified.  No definite nodular airway thickening.  IMPRESSION: 1. Pathologic right hilar and infrahilar adenopathy but without left hilar or mediastinal adenopathy, and with slightly irregular pleural based lesions medially in the right lower lobe in this patient with known segmental regions of dural enhancement  and myelitis throughout the spinal cord and in portions of the brain. A unifying diagnosis is problematic as the imaging appearance is not considered highly characteristic for a specific disease process. The unilateral hilar adenopathy and lack of nodular airway thickening would be unusual for sarcoidosis with neurosarcoidosis as the underlying cause, but it would also be unusual for this to represent a non-small cell lung cancer with dural metastatic disease and paraneoplastic myelitis and cerebritis given the patient's young age. Correlation with serum ACE levels suggested in working up the sarcoidosis angle although these are not entirely specific. Bronchoscopic biopsy of the lymph nodes may provide useful diagnostic information. With regard to the pleural-based lesions, I am uncertain whether these are true lesions or simply represent regional atelectasis. There is an adjacent 4 mm nodule in the right lower lobe, too small to biopsy percutaneously. Entities such as right-sided tuberculosis with TB involvement of the derived likewise seem unlikely.   Electronically Signed   By: WSherryl BartersM.D.   On: 08/05/2014 10:44   Ct Cervical Spine Wo Contrast  08/01/2014   CLINICAL DATA:  General pain and leg numbness for 3 months  EXAM: CT CERVICAL SPINE WITHOUT CONTRAST  TECHNIQUE: Multidetector CT imaging of the cervical spine was performed without intravenous contrast. Multiplanar CT image reconstructions were also generated.  COMPARISON:  None.  FINDINGS: The alignment is anatomic. The vertebral body heights are maintained. There is no acute fracture. There is no static listhesis. The prevertebral soft tissues are normal. The intraspinal soft tissues are not fully imaged on this examination due to poor soft tissue contrast, but there is no gross soft tissue abnormality.  The disc spaces are maintained. There is a mild broad-based disc bulge at C3-4 and C4-5.  The visualized portions of the lung apices  demonstrate no focal abnormality.  IMPRESSION: No acute osseous injury of the cervical spine.   Electronically Signed   By: HKathreen Devoid  On: 08/01/2014 00:55   Ct Lumbar Spine Wo Contrast  08/01/2014   CLINICAL DATA:  Low back pain and numbness  EXAM: CT LUMBAR SPINE WITHOUT CONTRAST  TECHNIQUE: Multidetector CT imaging of the lumbar spine was performed without intravenous contrast administration. Multiplanar CT image reconstructions were also generated.  COMPARISON:  None.  FINDINGS: The vertebral body heights are maintained. The alignment is anatomic. The paravertebral soft tissues are normal. There is no fracture or static listhesis. There is no spondylolysis. The disc spaces are preserved. The visualized portion of the SI joints are unremarkable.  T12-L1: There is no significant disc bulge, foraminal stenosis or central canal stenosis.  L1-L2: There is no significant disc bulge, foraminal stenosis or central canal stenosis.  L2-L3: There is no significant disc bulge, foraminal stenosis or central canal stenosis.  L3-L4: There is no significant disc bulge, foraminal stenosis or central canal stenosis.  L4-L5: There is no significant disc bulge, foraminal stenosis or central canal stenosis.  L5-S1: There is no significant disc bulge, foraminal stenosis or central canal stenosis.  IMPRESSION: Normal CT of the lumbar spine.   Electronically Signed   By: Kathreen Devoid   On: 08/01/2014 01:02   Mr Brain W Wo Contrast  08/05/2014   ADDENDUM REPORT: 08/05/2014 12:20  ADDENDUM: In comparison with the subsequent CT, a right hilar adenopathy is evident. Posterior medial pleural disease is also present in the right lower lobe. This may correspond with an infectious etiology.  Although the CT scan is not typical of sarcoid, sarcoidosis should also be considered within the spine.   Electronically Signed   By: Lawrence Santiago M.D.   On: 08/05/2014 12:20   08/05/2014   CLINICAL DATA:  Abnormal MRI of the cervical spine.  Fall 2 months ago. Neck injury. Back and neck pain. Positive Lhermitte's sign.  EXAM: MRI HEAD WITHOUT AND WITH CONTRAST  MRI CERVICAL SPINE WITH CONTRAST  MRI THORACIC SPINE WITHOUT AND WITH CONTRAST  TECHNIQUE: Multiplanar, multiecho pulse sequences of the brain and surrounding structures, and cervical and thoracic spine, to include the craniocervical junction , were obtained without and with intravenous contrast.  CONTRAST:  70mL MULTIHANCE GADOBENATE DIMEGLUMINE 529 MG/ML IV SOLN  COMPARISON:  MRI cervical spine from the same day.  FINDINGS: MRI HEAD FINDINGS  A focal area of subcortical T2 hyperintensity is present in the posterior right frontal lobe. Abnormal signal is present along the genu of the corpus callosum, left greater than right.  There is associated enhancement along the posterior genu of the corpus callosum left greater than right. No other significant focal enhancement is present.  There is subtle diffuse periventricular white matter change bilaterally without significant associated enhancement. Mild dural enhancement is present throughout, most prominent over the left tentorium.  Flow is present in the major intracranial arteries. The globes and orbits are intact. Mild mucosal thickening is present in the left frontal sinus in the anterior ethmoid air cells bilaterally. The mastoid air cells are clear.  The brain is otherwise unremarkable. No acute infarct, hemorrhage, or mass lesion is present. The ventricles are of normal size.  MRI CERVICAL SPINE FINDINGS  The postcontrast images of the cervical spine demonstrate symmetric lateral enhancement from the craniocervical junction to the C1-2 level. There is asymmetric left-sided posterior lateral dural enhancement at the level of C4. Diffuse dorsal enhancement is evident from C5 through T2. This involves both the dorsal aspect of the cord and the dura. There is no pathologic enhancement of the vertebral bodies.  MRI THORACIC SPINE FINDINGS  There  is diffuse cord signal abnormality in some expansion  through the entire thoracic cord to the level of the conus medullaris which terminates at T12. Marrow signal, vertebral body heights, alignment are normal. Segment focal areas of dorsal cord in dural enhancement are present from T4 through T7 and again most notably from T9-10 through T11-12. There is some degree of diffuse dural enhancement throughout the thoracic spinal cord. Portions of the dorsal cord enhance as well.  There is no focal disc protrusion or central canal stenosis. The foramina are widely patent bilaterally.  IMPRESSION: 1. The findings are nonspecific but suggestive of ADEM. Given the extensive segmental areas of dural enhancement, an infectious myelitis must be considered. Neuromyelitis optica is considered. Without other etiology, this would be considered idiopathic acute transverse myelitis. Multiple sclerosis is considered less likely. This does not appear to be a neoplastic process. 2. Subtle T2 changes in the brain are less prominent than in the thoracic spine and likely related to the same process. 3. Mild diffuse dural enhancement is present over the brain and spinal cord. 4. Focal enhancement is present along the up minimal surface of the left lateral ventricle as described. These results were called by telephone at the time of interpretation on 08/04/2014 at 8:51 pm to Dr. Wallie Char, who verbally acknowledged these results.  Electronically Signed: By: Lawrence Santiago M.D. On: 08/04/2014 20:52   Mr Cervical Spine Wo Contrast  08/04/2014   CLINICAL DATA:  Neck pain with weakness and numbness in both legs, left greater than right. Fall in bathtub July, 2015.  EXAM: MRI CERVICAL SPINE WITHOUT CONTRAST  TECHNIQUE: Multiplanar, multisequence MR imaging of the cervical spine was performed. No intravenous contrast was administered.  COMPARISON:  08/01/2014 CT scan  FINDINGS: Prominent symmetric cord expansion and edema signal extending  from the C2 level down through at least the T8 level is observed, with corresponding low T1 signal in this large segment of the cord. No middle or lower upon teen abnormal signal is observed.  The craniocervical junction appears unremarkable. No vertebral subluxation is observed. No significant vertebral marrow edema is identified. Additional findings at individual levels are as follows:  C2-3:  Unremarkable.  C3-4: Mild disc bulge slightly indents the anterior cord but only because the cord is expanded.  C4-5: Mild disc bulge indents the anterior cord but only because the cord is expanded.  C5-6: Mild disc bulge indents the anterior cord but only because the cord is expanded.  C6-7: Small central disc protrusion indents the anterior cord but only because the cord is expanded.  C7-T1:  Unremarkable.  We obtained a limited due view of the upper thoracic spine on sagittal images which included down to T8 on T2 weighted images. Please be aware that this does not constitute a diagnostic evaluation of the thoracic spine and only gets a brief and unreliable look at the upper thoracic spine. This does appear to show the cord edema signal/expansion extending down at least to the T8 level.  IMPRESSION: 1. Prominent abnormal edema signal and expansion of the spinal cord extending from the C2 level at least down to the T8 level. Differential diagnostic considerations include HIV myelitis, postviral/post vaccination autoimmune myelitis, Devic's disease, or demyelinating process. Tumor seems unlikely given the extended involvement. Further imaging workup is recommended, to include MRI of the brain with and without contrast; MRI thoracic spine with and without contrast; and post-contrast images of the cervical spine for further characterization. Neurology consultation is also recommended. I have contacted by telephone the flow managers associated with the  [facility] emergency room in order to track down and contact the  patient and have the patient immediately proceed to the [facility] Emergency Room for further neurologic assessment and imaging workup.   Electronically Signed   By: Sherryl Barters M.D.   On: 08/04/2014 13:03   Mr Cervical Spine W Contrast  08/05/2014   ADDENDUM REPORT: 08/05/2014 12:20  ADDENDUM: In comparison with the subsequent CT, a right hilar adenopathy is evident. Posterior medial pleural disease is also present in the right lower lobe. This may correspond with an infectious etiology.  Although the CT scan is not typical of sarcoid, sarcoidosis should also be considered within the spine.   Electronically Signed   By: Lawrence Santiago M.D.   On: 08/05/2014 12:20   08/05/2014   CLINICAL DATA:  Abnormal MRI of the cervical spine. Fall 2 months ago. Neck injury. Back and neck pain. Positive Lhermitte's sign.  EXAM: MRI HEAD WITHOUT AND WITH CONTRAST  MRI CERVICAL SPINE WITH CONTRAST  MRI THORACIC SPINE WITHOUT AND WITH CONTRAST  TECHNIQUE: Multiplanar, multiecho pulse sequences of the brain and surrounding structures, and cervical and thoracic spine, to include the craniocervical junction , were obtained without and with intravenous contrast.  CONTRAST:  48m MULTIHANCE GADOBENATE DIMEGLUMINE 529 MG/ML IV SOLN  COMPARISON:  MRI cervical spine from the same day.  FINDINGS: MRI HEAD FINDINGS  A focal area of subcortical T2 hyperintensity is present in the posterior right frontal lobe. Abnormal signal is present along the genu of the corpus callosum, left greater than right.  There is associated enhancement along the posterior genu of the corpus callosum left greater than right. No other significant focal enhancement is present.  There is subtle diffuse periventricular white matter change bilaterally without significant associated enhancement. Mild dural enhancement is present throughout, most prominent over the left tentorium.  Flow is present in the major intracranial arteries. The globes and orbits are  intact. Mild mucosal thickening is present in the left frontal sinus in the anterior ethmoid air cells bilaterally. The mastoid air cells are clear.  The brain is otherwise unremarkable. No acute infarct, hemorrhage, or mass lesion is present. The ventricles are of normal size.  MRI CERVICAL SPINE FINDINGS  The postcontrast images of the cervical spine demonstrate symmetric lateral enhancement from the craniocervical junction to the C1-2 level. There is asymmetric left-sided posterior lateral dural enhancement at the level of C4. Diffuse dorsal enhancement is evident from C5 through T2. This involves both the dorsal aspect of the cord and the dura. There is no pathologic enhancement of the vertebral bodies.  MRI THORACIC SPINE FINDINGS  There is diffuse cord signal abnormality in some expansion through the entire thoracic cord to the level of the conus medullaris which terminates at T12. Marrow signal, vertebral body heights, alignment are normal. Segment focal areas of dorsal cord in dural enhancement are present from T4 through T7 and again most notably from T9-10 through T11-12. There is some degree of diffuse dural enhancement throughout the thoracic spinal cord. Portions of the dorsal cord enhance as well.  There is no focal disc protrusion or central canal stenosis. The foramina are widely patent bilaterally.  IMPRESSION: 1. The findings are nonspecific but suggestive of ADEM. Given the extensive segmental areas of dural enhancement, an infectious myelitis must be considered. Neuromyelitis optica is considered. Without other etiology, this would be considered idiopathic acute transverse myelitis. Multiple sclerosis is considered less likely. This does not appear to be a neoplastic  process. 2. Subtle T2 changes in the brain are less prominent than in the thoracic spine and likely related to the same process. 3. Mild diffuse dural enhancement is present over the brain and spinal cord. 4. Focal enhancement is  present along the up minimal surface of the left lateral ventricle as described. These results were called by telephone at the time of interpretation on 08/04/2014 at 8:51 pm to Dr. Wallie Char, who verbally acknowledged these results.  Electronically Signed: By: Lawrence Santiago M.D. On: 08/04/2014 20:52   Mr Thoracic Spine W Wo Contrast  08/05/2014   ADDENDUM REPORT: 08/05/2014 12:20  ADDENDUM: In comparison with the subsequent CT, a right hilar adenopathy is evident. Posterior medial pleural disease is also present in the right lower lobe. This may correspond with an infectious etiology.  Although the CT scan is not typical of sarcoid, sarcoidosis should also be considered within the spine.   Electronically Signed   By: Lawrence Santiago M.D.   On: 08/05/2014 12:20   08/05/2014   CLINICAL DATA:  Abnormal MRI of the cervical spine. Fall 2 months ago. Neck injury. Back and neck pain. Positive Lhermitte's sign.  EXAM: MRI HEAD WITHOUT AND WITH CONTRAST  MRI CERVICAL SPINE WITH CONTRAST  MRI THORACIC SPINE WITHOUT AND WITH CONTRAST  TECHNIQUE: Multiplanar, multiecho pulse sequences of the brain and surrounding structures, and cervical and thoracic spine, to include the craniocervical junction , were obtained without and with intravenous contrast.  CONTRAST:  4mL MULTIHANCE GADOBENATE DIMEGLUMINE 529 MG/ML IV SOLN  COMPARISON:  MRI cervical spine from the same day.  FINDINGS: MRI HEAD FINDINGS  A focal area of subcortical T2 hyperintensity is present in the posterior right frontal lobe. Abnormal signal is present along the genu of the corpus callosum, left greater than right.  There is associated enhancement along the posterior genu of the corpus callosum left greater than right. No other significant focal enhancement is present.  There is subtle diffuse periventricular white matter change bilaterally without significant associated enhancement. Mild dural enhancement is present throughout, most prominent over the  left tentorium.  Flow is present in the major intracranial arteries. The globes and orbits are intact. Mild mucosal thickening is present in the left frontal sinus in the anterior ethmoid air cells bilaterally. The mastoid air cells are clear.  The brain is otherwise unremarkable. No acute infarct, hemorrhage, or mass lesion is present. The ventricles are of normal size.  MRI CERVICAL SPINE FINDINGS  The postcontrast images of the cervical spine demonstrate symmetric lateral enhancement from the craniocervical junction to the C1-2 level. There is asymmetric left-sided posterior lateral dural enhancement at the level of C4. Diffuse dorsal enhancement is evident from C5 through T2. This involves both the dorsal aspect of the cord and the dura. There is no pathologic enhancement of the vertebral bodies.  MRI THORACIC SPINE FINDINGS  There is diffuse cord signal abnormality in some expansion through the entire thoracic cord to the level of the conus medullaris which terminates at T12. Marrow signal, vertebral body heights, alignment are normal. Segment focal areas of dorsal cord in dural enhancement are present from T4 through T7 and again most notably from T9-10 through T11-12. There is some degree of diffuse dural enhancement throughout the thoracic spinal cord. Portions of the dorsal cord enhance as well.  There is no focal disc protrusion or central canal stenosis. The foramina are widely patent bilaterally.  IMPRESSION: 1. The findings are nonspecific but suggestive of ADEM. Given the  extensive segmental areas of dural enhancement, an infectious myelitis must be considered. Neuromyelitis optica is considered. Without other etiology, this would be considered idiopathic acute transverse myelitis. Multiple sclerosis is considered less likely. This does not appear to be a neoplastic process. 2. Subtle T2 changes in the brain are less prominent than in the thoracic spine and likely related to the same process. 3. Mild  diffuse dural enhancement is present over the brain and spinal cord. 4. Focal enhancement is present along the up minimal surface of the left lateral ventricle as described. These results were called by telephone at the time of interpretation on 08/04/2014 at 8:51 pm to Dr. Wallie Char, who verbally acknowledged these results.  Electronically Signed: By: Lawrence Santiago M.D. On: 08/04/2014 20:52   Dg Fluoro Guide Lumbar Puncture  08/06/2014   CLINICAL DATA:  Myelitis, not otherwise specified  EXAM: DIAGNOSTIC LUMBAR PUNCTURE UNDER FLUOROSCOPIC GUIDANCE  FLUOROSCOPY TIME:  8 seconds  PROCEDURE: Informed consent was obtained from the patient prior to the procedure, including potential complications of headache, allergy, and pain. With the patient prone, the lower back was prepped with Betadine. 1% Lidocaine was used for local anesthesia. Lumbar puncture was performed at the L4-5 level using a 20 gauge needle with return of clear CSF with an opening pressure of 15 cm water. 10m of CSF were obtained for laboratory studies. The patient tolerated the procedure well and there were no apparent complications.  IMPRESSION: Successful fluoroscopic guided lumbar puncture.   Electronically Signed   By: HKathreen Devoid  On: 08/06/2014 13:04    Scheduled Meds: . enoxaparin (LOVENOX) injection  40 mg Subcutaneous Q24H  . methylPREDNISolone (SOLU-MEDROL) injection  500 mg Intravenous Q12H  . pantoprazole  40 mg Oral Daily  . polyethylene glycol  17 g Oral Daily   Continuous Infusions:   Principal Problem:   Myelitis Active Problems:   Asthma, chronic   Solitary pulmonary nodule   Pleural nodule   Mediastinal lymphadenopathy    Time spent: 40 minutes   [facility] Pager [phone] If 7PM-7AM, please contact night-coverage at [email or web address], password [email or web address]10/01/2014, 10:19 AM  LOS: 3 days

## 2014-08-08 LAB — COMPREHENSIVE METABOLIC PANEL
ALBUMIN: 3.5 g/dL (ref 3.5–5.2)
ALT: 32 U/L (ref 0–53)
ANION GAP: 11 (ref 5–15)
AST: 18 U/L (ref 0–37)
Alkaline Phosphatase: 72 U/L (ref 39–117)
BUN: 15 mg/dL (ref 6–23)
CALCIUM: 9.1 mg/dL (ref 8.4–10.5)
CO2: 28 mEq/L (ref 19–32)
CREATININE: 0.98 mg/dL (ref 0.50–1.35)
Chloride: 98 mEq/L (ref 96–112)
GFR calc Af Amer: >90 mL/min (ref >90)
GFR calc non Af Amer: >90 mL/min (ref >90)
Glucose, Bld: 134 mg/dL — ABNORMAL HIGH (ref 70–99)
Potassium: 4.3 mEq/L (ref 3.7–5.3)
Sodium: 137 mEq/L (ref 137–147)
TOTAL PROTEIN: 7 g/dL (ref 6.0–8.3)
Total Bilirubin: 0.5 mg/dL (ref 0.3–1.2)

## 2014-08-08 LAB — CBC
HEMATOCRIT: 37.4 % — AB (ref 39.0–52.0)
HEMOGLOBIN: 13.2 g/dL (ref 13.0–17.0)
MCH: 26.9 pg (ref 26.0–34.0)
MCHC: 35.3 g/dL (ref 30.0–36.0)
MCV: 76.3 fL — ABNORMAL LOW (ref 78.0–100.0)
Platelets: 237 10*3/uL (ref 150–400)
RBC: 4.9 MIL/uL (ref 4.22–5.81)
RDW: 13.6 % (ref 11.5–15.5)
WBC: 14.2 10*3/uL — AB (ref 4.0–10.5)

## 2014-08-08 LAB — HERPES SIMPLEX VIRUS(HSV) DNA BY PCR
HSV 1 DNA: NOT DETECTED
HSV 2 DNA: NOT DETECTED

## 2014-08-08 LAB — TROPONIN I: Troponin I: <0.3 ng/mL (ref <0.30)

## 2014-08-08 LAB — ENTEROVIRUS PCR: ENTEROVIRUS PCR: NOT DETECTED

## 2014-08-08 NOTE — Progress Notes (Signed)
[facility] PROGRESS NOTE  Gregory Fuller UGQ:[id number] DOB: July 25, 1981 DOA: 08/04/2014 PCP: No PCP Per Patient  Assessment/Plan: Principal Problem:   Myelitis Active Problems:   Asthma, chronic   Solitary pulmonary nodule   Pleural nodule   Mediastinal lymphadenopathy    Myelitis  33 year old male with myelitis of unclear etiology. Differential diagnosis includes multiple sclerosis, neuromyelitis optica, infectious myelitis, autoimmune disease, metabolic myelopathy.  .  Extensive workup ordered by neurology  ANA positive, reflex pending  Angiotensin-converting enzyme-negative  ESR, CRP-negative  Vitamin E-pending  HIV antibody-negative  ENA-negative(includes DS DAN, SSA, SSB scl70, smith)  Rheumatoid factor-negative  Neuromyelitis optica antibody-pending  RPR-negative  ANCA - pending  Lyme titers-negative  Cardiolipin antibodies is pending  vitamin E-pending  EBV-pending  HTLV I + II  [place] virus - pending  NMO pending    CSF:  WBC 310 (no suspicion for infection as per Dr. Katherine Roan)  RBC 4  Protein 114  Glucose 85  MTB by pcr  HSV  CMV  Enterovirus  Preliminary culture of CSF negative  C3, C4 microglobulin, vitamin E pending   MRI brain with/without contrast, MRI c-spine w/contrast, MRI T-spine w/wo contrast findings suggest ADEM  Continue Solumedrol 556m BID per neurology    Hilar adenopathy  Pathologic right hilar and infrahilar adenopathy , normal LDH  Not likely to have a malignancy per pulmonary, no indication for bronchoscopy at this time  Await CSF cytology , flow cytometry  Question sarcoidosis,non-small cell lung cancer with dural metastatic disease and paraneoplastic myelitis  Appreciate pulmonary input  Complaining of chest tightness, and check troponin x1, patient is not wheezing  Constipation  Started on MiraLAX   DVT  On Lovenox for DVT prophylaxis    Code Status: full  Family Communication: family updated about  patient's clinical progress  Disposition Plan: Per neurology   Brief narrative:  Gregory NEELYis an 33y.o. male who fell in the tube 2 months ago. He noted he slipped and when he landed he landed on his neck. He did not lose consciousness. A few days after his fall he noted both back and neck pain. He also noted when he looked down he had a electrical shock like sen sensation that went down his back. He seeked medical attention and was given a shot of steroids which seemed to help. A few days later his pain returned and he went to urgent care where he was give a 10 mg steroid dose pack. This seemed to help the pain, but again when he finished the dose pack his neck pain returned. Patient obtained a MRI of cervical spine today which demonstrated abnormal cord signal from C2-T8.   Consultants:  Neurology  Pulmonary  Procedures:  Lumbar puncture Antibiotics:  None    HPI/Subjective:  Ambulating, complaining of chest tightness today, no chest pain or shortness of breath   Objective: Filed Vitals:   08/07/14 2223 08/08/14 0227 08/08/14 0522 08/08/14 1015  BP: 128/68 129/69 139/75 148/65  Pulse: 55 60 69 72  Temp: 98 F (36.7 C) 97.7 F (36.5 C) 98 F (36.7 C) 97.7 F (36.5 C)  TempSrc: Oral Oral Oral Oral  Resp: 18 17 18 18   Height:      Weight:      SpO2: 94% 98% 97% 99%   No intake or output data in the 24 hours ending 08/08/14 1042  Exam:  Gen: In bed, NAD  MS: Awake, alert, oriented, appropriate  CN: Pupils equal round and  reactive, extraocular movements intact  Motor: Moves all extremity as well  Sensory: Decreased to pinprick distally       Data Reviewed: Basic Metabolic Panel:  Recent Labs Lab 08/04/14 1627 08/05/14 0730 08/08/14 0414  NA 140 137 137  K 4.1 4.4 4.3  CL 102 101 98  CO2 27 23 28   GLUCOSE 88 134* 134*  BUN 11 11 15   CREATININE 1.16 1.04 0.98  CALCIUM 10.1 10.0 9.1    Liver Function Tests:  Recent Labs Lab 08/04/14 1627  08/05/14 0730 08/08/14 0414  AST 21 17 18   ALT 29 25 32  ALKPHOS 71 69 72  BILITOT 0.6 0.8 0.5  PROT 8.3 8.4* 7.0  ALBUMIN 4.4 4.3 3.5   No results found for this basename: LIPASE, AMYLASE,  in the last 168 hours No results found for this basename: AMMONIA,  in the last 168 hours  CBC:  Recent Labs Lab 08/04/14 1627 08/05/14 0730 08/08/14 0414  WBC 6.0 6.3 14.2*  NEUTROABS 3.6 5.6  --   HGB 14.9 14.9 13.2  HCT 42.0 42.4 37.4*  MCV 78.9 77.1* 76.3*  PLT 236 236 237    Cardiac Enzymes: No results found for this basename: CKTOTAL, CKMB, CKMBINDEX, TROPONINI,  in the last 168 hours BNP (last 3 results) No results found for this basename: PROBNP,  in the last 8760 hours   CBG: No results found for this basename: GLUCAP,  in the last 168 hours  Recent Results (from the past 240 hour(s))  CSF CULTURE     Status: None   Collection Time    08/06/14 11:55 AM      Result Value Ref Range Status   Specimen Description CSF   Final   Special Requests 5.0ML FLUID   Final   Gram Stain     Final   Value: WBC PRESENT, PREDOMINANTLY MONONUCLEAR     NO ORGANISMS SEEN     CYTOSPIN Performed at [facility]     Performed at [facility]   Culture     Final   Value: NO GROWTH 1 DAY     Performed at [facility]   Report Status PENDING   Incomplete  GRAM STAIN     Status: None   Collection Time    08/06/14 11:55 AM      Result Value Ref Range Status   Specimen Description CSF   Final   Special Requests 5.0ML FLUID   Final   Gram Stain     Final   Value: CYTOSPIN PREP     WBC PRESENT, PREDOMINANTLY MONONUCLEAR     NO ORGANISMS SEEN   Report Status 08/06/2014 FINAL   Final     Studies: Dg Chest 2 View  08/04/2014   CLINICAL DATA:  Abnormal C-spine MRI. Differential considerations include sarcoidosis.  EXAM: CHEST  2 VIEW  COMPARISON:  None.  FINDINGS: Lungs are essentially clear. No focal consolidation. No pleural effusion or pneumothorax.  The heart is  normal in size.  Mild prominence of the bilateral pulmonary hila. Adenopathy is possible. Right paratracheal stripe is not enlarged.  Visualized osseous structures are within normal limits.  IMPRESSION: Mild prominence of the bilateral pulmonary hila, possibly reflecting hilar adenopathy, although equivocal.  If further imaging evaluation is desired, consider CT chest with contrast.  Otherwise, no evidence of acute cardiopulmonary disease.   Electronically Signed   By: Julian Hy M.D.   On: 08/04/2014 18:10   Ct Chest W  Contrast  08/05/2014   CLINICAL DATA:  Hilar prominence on chest radiates atrophy, query adenopathy. Query sarcoidosis. The patient has a encephalomyelitis based on narrow imaging.  EXAM: CT CHEST WITH CONTRAST  TECHNIQUE: Multidetector CT imaging of the chest was performed during intravenous contrast administration.  CONTRAST:  88m OMNIPAQUE IOHEXOL 300 MG/ML  SOLN  COMPARISON:  Multiple exams, including 08/04/2014  FINDINGS: CT confirms right hilar and infrahilar adenopathy, with a right posterior hilar node measuring 1.7 cm in short axis and a right infrahilar node measuring 1.6 cm in short axis on images 25 and 27, respectively. Additional enlarged infrahilar and hilar nodes are present on the right side. No adenopathy on the left or in the remainder the mediastinum. No axillary or internal mammary adenopathy. No definite regional endobronchial lesion or significant extrinsic narrowing of the airways.  Peripherally in the left lower lobe there is a pleural based slightly irregularly marginated lesion on image 43 of series 3 measuring 1.4 cm at its pleural base and 0.7 cm in height. Adjacent to this is a flat but slightly irregular 2.8 by 0.5 cm subpleural lesion. There is a 4 mm left lower lobe nodule on image 35 of series 3 which appears noncalcified.  No definite nodular airway thickening.  IMPRESSION: 1. Pathologic right hilar and infrahilar adenopathy but without left hilar or  mediastinal adenopathy, and with slightly irregular pleural based lesions medially in the right lower lobe in this patient with known segmental regions of dural enhancement and myelitis throughout the spinal cord and in portions of the brain. A unifying diagnosis is problematic as the imaging appearance is not considered highly characteristic for a specific disease process. The unilateral hilar adenopathy and lack of nodular airway thickening would be unusual for sarcoidosis with neurosarcoidosis as the underlying cause, but it would also be unusual for this to represent a non-small cell lung cancer with dural metastatic disease and paraneoplastic myelitis and cerebritis given the patient's young age. Correlation with serum ACE levels suggested in working up the sarcoidosis angle although these are not entirely specific. Bronchoscopic biopsy of the lymph nodes may provide useful diagnostic information. With regard to the pleural-based lesions, I am uncertain whether these are true lesions or simply represent regional atelectasis. There is an adjacent 4 mm nodule in the right lower lobe, too small to biopsy percutaneously. Entities such as right-sided tuberculosis with TB involvement of the derived likewise seem unlikely.   Electronically Signed   By: WSherryl BartersM.D.   On: 08/05/2014 10:44   Ct Cervical Spine Wo Contrast  08/01/2014   CLINICAL DATA:  General pain and leg numbness for 3 months  EXAM: CT CERVICAL SPINE WITHOUT CONTRAST  TECHNIQUE: Multidetector CT imaging of the cervical spine was performed without intravenous contrast. Multiplanar CT image reconstructions were also generated.  COMPARISON:  None.  FINDINGS: The alignment is anatomic. The vertebral body heights are maintained. There is no acute fracture. There is no static listhesis. The prevertebral soft tissues are normal. The intraspinal soft tissues are not fully imaged on this examination due to poor soft tissue contrast, but there is no  gross soft tissue abnormality.  The disc spaces are maintained. There is a mild broad-based disc bulge at C3-4 and C4-5.  The visualized portions of the lung apices demonstrate no focal abnormality.  IMPRESSION: No acute osseous injury of the cervical spine.   Electronically Signed   By: HKathreen Devoid  On: 08/01/2014 00:55   Ct Lumbar Spine Wo  Contrast  08/01/2014   CLINICAL DATA:  Low back pain and numbness  EXAM: CT LUMBAR SPINE WITHOUT CONTRAST  TECHNIQUE: Multidetector CT imaging of the lumbar spine was performed without intravenous contrast administration. Multiplanar CT image reconstructions were also generated.  COMPARISON:  None.  FINDINGS: The vertebral body heights are maintained. The alignment is anatomic. The paravertebral soft tissues are normal. There is no fracture or static listhesis. There is no spondylolysis. The disc spaces are preserved. The visualized portion of the SI joints are unremarkable.  T12-L1: There is no significant disc bulge, foraminal stenosis or central canal stenosis.  L1-L2: There is no significant disc bulge, foraminal stenosis or central canal stenosis.  L2-L3: There is no significant disc bulge, foraminal stenosis or central canal stenosis.  L3-L4: There is no significant disc bulge, foraminal stenosis or central canal stenosis.  L4-L5: There is no significant disc bulge, foraminal stenosis or central canal stenosis.  L5-S1: There is no significant disc bulge, foraminal stenosis or central canal stenosis.  IMPRESSION: Normal CT of the lumbar spine.   Electronically Signed   By: Kathreen Devoid   On: 08/01/2014 01:02   Mr Brain W Wo Contrast  08/05/2014   ADDENDUM REPORT: 08/05/2014 12:20  ADDENDUM: In comparison with the subsequent CT, a right hilar adenopathy is evident. Posterior medial pleural disease is also present in the right lower lobe. This may correspond with an infectious etiology.  Although the CT scan is not typical of sarcoid, sarcoidosis should also be  considered within the spine.   Electronically Signed   By: Lawrence Santiago M.D.   On: 08/05/2014 12:20   08/05/2014   CLINICAL DATA:  Abnormal MRI of the cervical spine. Fall 2 months ago. Neck injury. Back and neck pain. Positive Lhermitte's sign.  EXAM: MRI HEAD WITHOUT AND WITH CONTRAST  MRI CERVICAL SPINE WITH CONTRAST  MRI THORACIC SPINE WITHOUT AND WITH CONTRAST  TECHNIQUE: Multiplanar, multiecho pulse sequences of the brain and surrounding structures, and cervical and thoracic spine, to include the craniocervical junction , were obtained without and with intravenous contrast.  CONTRAST:  85m MULTIHANCE GADOBENATE DIMEGLUMINE 529 MG/ML IV SOLN  COMPARISON:  MRI cervical spine from the same day.  FINDINGS: MRI HEAD FINDINGS  A focal area of subcortical T2 hyperintensity is present in the posterior right frontal lobe. Abnormal signal is present along the genu of the corpus callosum, left greater than right.  There is associated enhancement along the posterior genu of the corpus callosum left greater than right. No other significant focal enhancement is present.  There is subtle diffuse periventricular white matter change bilaterally without significant associated enhancement. Mild dural enhancement is present throughout, most prominent over the left tentorium.  Flow is present in the major intracranial arteries. The globes and orbits are intact. Mild mucosal thickening is present in the left frontal sinus in the anterior ethmoid air cells bilaterally. The mastoid air cells are clear.  The brain is otherwise unremarkable. No acute infarct, hemorrhage, or mass lesion is present. The ventricles are of normal size.  MRI CERVICAL SPINE FINDINGS  The postcontrast images of the cervical spine demonstrate symmetric lateral enhancement from the craniocervical junction to the C1-2 level. There is asymmetric left-sided posterior lateral dural enhancement at the level of C4. Diffuse dorsal enhancement is evident from C5  through T2. This involves both the dorsal aspect of the cord and the dura. There is no pathologic enhancement of the vertebral bodies.  MRI THORACIC SPINE FINDINGS  There  is diffuse cord signal abnormality in some expansion through the entire thoracic cord to the level of the conus medullaris which terminates at T12. Marrow signal, vertebral body heights, alignment are normal. Segment focal areas of dorsal cord in dural enhancement are present from T4 through T7 and again most notably from T9-10 through T11-12. There is some degree of diffuse dural enhancement throughout the thoracic spinal cord. Portions of the dorsal cord enhance as well.  There is no focal disc protrusion or central canal stenosis. The foramina are widely patent bilaterally.  IMPRESSION: 1. The findings are nonspecific but suggestive of ADEM. Given the extensive segmental areas of dural enhancement, an infectious myelitis must be considered. Neuromyelitis optica is considered. Without other etiology, this would be considered idiopathic acute transverse myelitis. Multiple sclerosis is considered less likely. This does not appear to be a neoplastic process. 2. Subtle T2 changes in the brain are less prominent than in the thoracic spine and likely related to the same process. 3. Mild diffuse dural enhancement is present over the brain and spinal cord. 4. Focal enhancement is present along the up minimal surface of the left lateral ventricle as described. These results were called by telephone at the time of interpretation on 08/04/2014 at 8:51 pm to Dr. Wallie Char, who verbally acknowledged these results.  Electronically Signed: By: Lawrence Santiago M.D. On: 08/04/2014 20:52   Mr Cervical Spine Wo Contrast  08/04/2014   CLINICAL DATA:  Neck pain with weakness and numbness in both legs, left greater than right. Fall in bathtub July, 2015.  EXAM: MRI CERVICAL SPINE WITHOUT CONTRAST  TECHNIQUE: Multiplanar, multisequence MR imaging of the cervical  spine was performed. No intravenous contrast was administered.  COMPARISON:  08/01/2014 CT scan  FINDINGS: Prominent symmetric cord expansion and edema signal extending from the C2 level down through at least the T8 level is observed, with corresponding low T1 signal in this large segment of the cord. No middle or lower upon teen abnormal signal is observed.  The craniocervical junction appears unremarkable. No vertebral subluxation is observed. No significant vertebral marrow edema is identified. Additional findings at individual levels are as follows:  C2-3:  Unremarkable.  C3-4: Mild disc bulge slightly indents the anterior cord but only because the cord is expanded.  C4-5: Mild disc bulge indents the anterior cord but only because the cord is expanded.  C5-6: Mild disc bulge indents the anterior cord but only because the cord is expanded.  C6-7: Small central disc protrusion indents the anterior cord but only because the cord is expanded.  C7-T1:  Unremarkable.  We obtained a limited due view of the upper thoracic spine on sagittal images which included down to T8 on T2 weighted images. Please be aware that this does not constitute a diagnostic evaluation of the thoracic spine and only gets a brief and unreliable look at the upper thoracic spine. This does appear to show the cord edema signal/expansion extending down at least to the T8 level.  IMPRESSION: 1. Prominent abnormal edema signal and expansion of the spinal cord extending from the C2 level at least down to the T8 level. Differential diagnostic considerations include HIV myelitis, postviral/post vaccination autoimmune myelitis, Devic's disease, or demyelinating process. Tumor seems unlikely given the extended involvement. Further imaging workup is recommended, to include MRI of the brain with and without contrast; MRI thoracic spine with and without contrast; and post-contrast images of the cervical spine for further characterization. Neurology  consultation is also recommended. I have  contacted by telephone the flow managers associated with the [facility] emergency room in order to track down and contact the patient and have the patient immediately proceed to the [facility] Emergency Room for further neurologic assessment and imaging workup.   Electronically Signed   By: Sherryl Barters M.D.   On: 08/04/2014 13:03   Mr Cervical Spine W Contrast  08/05/2014   ADDENDUM REPORT: 08/05/2014 12:20  ADDENDUM: In comparison with the subsequent CT, a right hilar adenopathy is evident. Posterior medial pleural disease is also present in the right lower lobe. This may correspond with an infectious etiology.  Although the CT scan is not typical of sarcoid, sarcoidosis should also be considered within the spine.   Electronically Signed   By: Lawrence Santiago M.D.   On: 08/05/2014 12:20   08/05/2014   CLINICAL DATA:  Abnormal MRI of the cervical spine. Fall 2 months ago. Neck injury. Back and neck pain. Positive Lhermitte's sign.  EXAM: MRI HEAD WITHOUT AND WITH CONTRAST  MRI CERVICAL SPINE WITH CONTRAST  MRI THORACIC SPINE WITHOUT AND WITH CONTRAST  TECHNIQUE: Multiplanar, multiecho pulse sequences of the brain and surrounding structures, and cervical and thoracic spine, to include the craniocervical junction , were obtained without and with intravenous contrast.  CONTRAST:  10m MULTIHANCE GADOBENATE DIMEGLUMINE 529 MG/ML IV SOLN  COMPARISON:  MRI cervical spine from the same day.  FINDINGS: MRI HEAD FINDINGS  A focal area of subcortical T2 hyperintensity is present in the posterior right frontal lobe. Abnormal signal is present along the genu of the corpus callosum, left greater than right.  There is associated enhancement along the posterior genu of the corpus callosum left greater than right. No other significant focal enhancement is present.  There is subtle diffuse periventricular white matter change bilaterally without significant associated  enhancement. Mild dural enhancement is present throughout, most prominent over the left tentorium.  Flow is present in the major intracranial arteries. The globes and orbits are intact. Mild mucosal thickening is present in the left frontal sinus in the anterior ethmoid air cells bilaterally. The mastoid air cells are clear.  The brain is otherwise unremarkable. No acute infarct, hemorrhage, or mass lesion is present. The ventricles are of normal size.  MRI CERVICAL SPINE FINDINGS  The postcontrast images of the cervical spine demonstrate symmetric lateral enhancement from the craniocervical junction to the C1-2 level. There is asymmetric left-sided posterior lateral dural enhancement at the level of C4. Diffuse dorsal enhancement is evident from C5 through T2. This involves both the dorsal aspect of the cord and the dura. There is no pathologic enhancement of the vertebral bodies.  MRI THORACIC SPINE FINDINGS  There is diffuse cord signal abnormality in some expansion through the entire thoracic cord to the level of the conus medullaris which terminates at T12. Marrow signal, vertebral body heights, alignment are normal. Segment focal areas of dorsal cord in dural enhancement are present from T4 through T7 and again most notably from T9-10 through T11-12. There is some degree of diffuse dural enhancement throughout the thoracic spinal cord. Portions of the dorsal cord enhance as well.  There is no focal disc protrusion or central canal stenosis. The foramina are widely patent bilaterally.  IMPRESSION: 1. The findings are nonspecific but suggestive of ADEM. Given the extensive segmental areas of dural enhancement, an infectious myelitis must be considered. Neuromyelitis optica is considered. Without other etiology, this would be considered idiopathic acute transverse myelitis. Multiple sclerosis is considered less likely.  This does not appear to be a neoplastic process. 2. Subtle T2 changes in the brain are less  prominent than in the thoracic spine and likely related to the same process. 3. Mild diffuse dural enhancement is present over the brain and spinal cord. 4. Focal enhancement is present along the up minimal surface of the left lateral ventricle as described. These results were called by telephone at the time of interpretation on 08/04/2014 at 8:51 pm to Dr. Wallie Char, who verbally acknowledged these results.  Electronically Signed: By: Lawrence Santiago M.D. On: 08/04/2014 20:52   Mr Thoracic Spine W Wo Contrast  08/05/2014   ADDENDUM REPORT: 08/05/2014 12:20  ADDENDUM: In comparison with the subsequent CT, a right hilar adenopathy is evident. Posterior medial pleural disease is also present in the right lower lobe. This may correspond with an infectious etiology.  Although the CT scan is not typical of sarcoid, sarcoidosis should also be considered within the spine.   Electronically Signed   By: Lawrence Santiago M.D.   On: 08/05/2014 12:20   08/05/2014   CLINICAL DATA:  Abnormal MRI of the cervical spine. Fall 2 months ago. Neck injury. Back and neck pain. Positive Lhermitte's sign.  EXAM: MRI HEAD WITHOUT AND WITH CONTRAST  MRI CERVICAL SPINE WITH CONTRAST  MRI THORACIC SPINE WITHOUT AND WITH CONTRAST  TECHNIQUE: Multiplanar, multiecho pulse sequences of the brain and surrounding structures, and cervical and thoracic spine, to include the craniocervical junction , were obtained without and with intravenous contrast.  CONTRAST:  75m MULTIHANCE GADOBENATE DIMEGLUMINE 529 MG/ML IV SOLN  COMPARISON:  MRI cervical spine from the same day.  FINDINGS: MRI HEAD FINDINGS  A focal area of subcortical T2 hyperintensity is present in the posterior right frontal lobe. Abnormal signal is present along the genu of the corpus callosum, left greater than right.  There is associated enhancement along the posterior genu of the corpus callosum left greater than right. No other significant focal enhancement is present.  There is  subtle diffuse periventricular white matter change bilaterally without significant associated enhancement. Mild dural enhancement is present throughout, most prominent over the left tentorium.  Flow is present in the major intracranial arteries. The globes and orbits are intact. Mild mucosal thickening is present in the left frontal sinus in the anterior ethmoid air cells bilaterally. The mastoid air cells are clear.  The brain is otherwise unremarkable. No acute infarct, hemorrhage, or mass lesion is present. The ventricles are of normal size.  MRI CERVICAL SPINE FINDINGS  The postcontrast images of the cervical spine demonstrate symmetric lateral enhancement from the craniocervical junction to the C1-2 level. There is asymmetric left-sided posterior lateral dural enhancement at the level of C4. Diffuse dorsal enhancement is evident from C5 through T2. This involves both the dorsal aspect of the cord and the dura. There is no pathologic enhancement of the vertebral bodies.  MRI THORACIC SPINE FINDINGS  There is diffuse cord signal abnormality in some expansion through the entire thoracic cord to the level of the conus medullaris which terminates at T12. Marrow signal, vertebral body heights, alignment are normal. Segment focal areas of dorsal cord in dural enhancement are present from T4 through T7 and again most notably from T9-10 through T11-12. There is some degree of diffuse dural enhancement throughout the thoracic spinal cord. Portions of the dorsal cord enhance as well.  There is no focal disc protrusion or central canal stenosis. The foramina are widely patent bilaterally.  IMPRESSION: 1. The findings  are nonspecific but suggestive of ADEM. Given the extensive segmental areas of dural enhancement, an infectious myelitis must be considered. Neuromyelitis optica is considered. Without other etiology, this would be considered idiopathic acute transverse myelitis. Multiple sclerosis is considered less likely.  This does not appear to be a neoplastic process. 2. Subtle T2 changes in the brain are less prominent than in the thoracic spine and likely related to the same process. 3. Mild diffuse dural enhancement is present over the brain and spinal cord. 4. Focal enhancement is present along the up minimal surface of the left lateral ventricle as described. These results were called by telephone at the time of interpretation on 08/04/2014 at 8:51 pm to Dr. Wallie Char, who verbally acknowledged these results.  Electronically Signed: By: Lawrence Santiago M.D. On: 08/04/2014 20:52   Dg Fluoro Guide Lumbar Puncture  08/06/2014   CLINICAL DATA:  Myelitis, not otherwise specified  EXAM: DIAGNOSTIC LUMBAR PUNCTURE UNDER FLUOROSCOPIC GUIDANCE  FLUOROSCOPY TIME:  8 seconds  PROCEDURE: Informed consent was obtained from the patient prior to the procedure, including potential complications of headache, allergy, and pain. With the patient prone, the lower back was prepped with Betadine. 1% Lidocaine was used for local anesthesia. Lumbar puncture was performed at the L4-5 level using a 20 gauge needle with return of clear CSF with an opening pressure of 15 cm water. 39m of CSF were obtained for laboratory studies. The patient tolerated the procedure well and there were no apparent complications.  IMPRESSION: Successful fluoroscopic guided lumbar puncture.   Electronically Signed   By: HKathreen Devoid  On: 08/06/2014 13:04    Scheduled Meds: . enoxaparin (LOVENOX) injection  40 mg Subcutaneous Q24H  . methylPREDNISolone (SOLU-MEDROL) injection  500 mg Intravenous Q12H  . pantoprazole  40 mg Oral Daily  . polyethylene glycol  17 g Oral Daily   Continuous Infusions:   Principal Problem:   Myelitis Active Problems:   Asthma, chronic   Solitary pulmonary nodule   Pleural nodule   Mediastinal lymphadenopathy    Time spent: 40 minutes   AEl BrazilHospitalists Pager [phone] If 7PM-7AM, please contact  night-coverage at [email or web address], password [facility]10/02/2014, 10:42 AM  LOS: 4 days

## 2014-08-09 ENCOUNTER — Ambulatory Visit (HOSPITAL_COMMUNITY): Admission: RE | Admit: 2014-08-09 | Payer: 59 | Source: Ambulatory Visit

## 2014-08-09 DIAGNOSIS — G0489 Other myelitis: Secondary | ICD-10-CM

## 2014-08-09 LAB — CYTOMEGALOVIRUS PCR, QUALITATIVE: CYTOMEGALOVIRUS DNA: NOT DETECTED

## 2014-08-09 LAB — CSF IGG: IgG, CSF: 14.3 mg/dL — ABNORMAL HIGH (ref 0.8–7.7)

## 2014-08-09 LAB — ARBOVIRUS PANEL, ~~LOC~~ LAB

## 2014-08-09 LAB — HIV-1 RNA, QUALITATIVE, TMA: HIV-1 RNA, Qualitative, TMA: NOT DETECTED

## 2014-08-09 MED ORDER — PANTOPRAZOLE SODIUM 40 MG PO TBEC: 40.0000 mg | DELAYED_RELEASE_TABLET | Freq: Every day | ORAL | Status: DC

## 2014-08-09 MED ORDER — PREDNISONE 10 MG PO TABS: ORAL_TABLET | ORAL | Status: DC

## 2014-08-09 MED ORDER — INFLUENZA VAC SPLIT QUAD 0.5 ML IM SUSY
0.5000 mL | PREFILLED_SYRINGE | Freq: Once | INTRAMUSCULAR | Status: AC
  Administered 2014-08-09: 0.5 mL via INTRAMUSCULAR
  Filled 2014-08-09: qty 0.5

## 2014-08-09 NOTE — Progress Notes (Signed)
Discharge orders received.  Discharge instructions and follow-up appointments reviewed with the patient.  VSS upon discharge.  IV removed and education complete.   Cori Razor, RN 08/09/2014

## 2014-08-09 NOTE — Progress Notes (Signed)
Subjective: No changes overnight  Exam: Filed Vitals:   08/09/14 0511  BP: 135/65  Pulse: 54  Temp: 98 F (36.7 C)  Resp: 18   Gen: In bed, NAD MS: Awake, alert, oriented, appropriate CN: Pupils equal round and reactive, extraocular movements intact Motor: Moves all extremity as well Sensory: Decreased to pinprick distally, no significant cahnge since admission.    ANA positive, titer 1:40, essentially negative Angiotensin-converting enzyme-negative ESR, CRP-negative Vitamin E-pending HIV antibody-negative ENA-negative(includes DS DAN, SSA, SSB scl70, smith) Rheumatoid factor-negative Neuromyelitis optica antibody-pending RPR-negative ANCA  -  c-anca positive, elisa for MPO and PR3 antibodies however were negative.   Lyme titers-negative Cardiolipin antibodies is pending vitamin E-pending EBV-pending  HTLV I + II [place] virus - pending NMO pending  CSF:  WBC 310 RBC 4 Protein 114 Glucose 85 MTB by pcr HSV - negative CMV - negative.  Enterovirus - negative  Cytology is pending.   Impression: 33-year-old male with myelitis of unclear etiology. Differential diagnosis includes multiple sclerosis, neuromyelitis optica, infectious myelitis, autoimmune disease, metabolic myelopathy. The longitudinally extensive nature of this as well as dural involvement  makes me concerned for autoimmune etiology, labs are pending.The time course would be very unusual for infectious etiology given that has been present now for months.   The presence of c-ANCA is difficult to interpret. He does not have clear findings of vasculitis. PR3 and MPO antibodies by elisa are negative so the interpretation of this result I feel is very unclear.   Given the duration and mild symptoms, I continue to suspect autoimmune as opposed to other etiologies. I do not think that he needs to stay in the hospital any longer, but can follow up as an outpatient.   Recommendations: 1) Finished high dose  steroids, I would favor a long taper 2) prednisone 60 mg daily x 5 days, then 40mg x 5 days, then further tapering by outpatient neurologist.  3) f/u with Dr. Willis on 10/13  McNeill Kirkpatrick, MD [facility] [phone]  If 7pm- 7am, please page neurology on call as listed in AMION.  

## 2014-08-09 NOTE — Progress Notes (Signed)
D/w Dr Kathrynn Speed    - sarcoid, lymphoma in ddx   =- LP results still pending esp cytology  Plan  - see neuro 08/07/14 - see me 08/20/14 - if dx clinched at neuro visit, then I will expectantly follow but if not set up PET scan and decide next couse  He is agreeable with plan  Future Appointments Date Time Provider [place]  08/17/2014 2:00 PM Kathrynn Ducking, MD [facility] None  08/20/2014 1:30 PM Brand Males, MD [facility] None     Dr. Brand Males, M.D., [facility].C.P Pulmonary and Critical Care Medicine Staff Physician [place] [facility] Pager: [phone], If no answer or between  15:00h - 7:00h: call [phone]  08/09/2014 11:39 AM

## 2014-08-09 NOTE — Progress Notes (Signed)
   Name: Gregory Fuller MRN: [id number] DOB: [birth year]    ADMISSION DATE:  08/04/2014 CONSULTATION DATE:  10/1  REFERRING MD :  [facility]  CHIEF COMPLAINT:  Back pain  BRIEF PATIENT DESCRIPTION:  NAD AAM with back/neck pain  SIGNIFICANT EVENTS  9/30 ana + 10/1 attempted LP 10/2 LP under IR  STUDIES:  10/1 CT chest: RT hilar adenopathy Rt irregular pleural based lesions 4 mm nodule 9/30 HIV neg CSF culture 10/2 >>>  SUBJECTIVE:  No acute events overnight  VITAL SIGNS: Temp:  [97.9 F (36.6 C)-98.6 F (37 C)] 97.9 F (36.6 C) (10/05 0933) Pulse Rate:  [54-67] 57 (10/05 0933) Resp:  [18-20] 18 (10/05 0933) BP: (135-152)/(61-89) 152/89 mmHg (10/05 0933) SpO2:  [98 %-100 %] 100 % (10/05 0933)  PHYSICAL EXAMINATION: General:  WNWD AAM . No acute distress, walking around room Neuro:  Alert, oriented, no focal deficit HEENT:  No LAN/JVD, oral pharynx unremarkable Cardiovascular: HSR RRR Lungs:  CTA Abdomen: Soft NT Musculoskeletal: No acute deformity or ROM limitation.  Skin: warm and dry    Recent Labs Lab 08/04/14 1627 08/05/14 0730 08/08/14 0414  NA 140 137 137  K 4.1 4.4 4.3  CL 102 101 98  CO2 _0 BUN _1 CREATININE 1.16 1.04 0.98  GLUCOSE 88 134* 134*    Recent Labs Lab 08/04/14 1627 08/05/14 0730 08/08/14 0414  HGB 14.9 14.9 13.2  HCT 42.0 42.4 37.4*  WBC 6.0 6.3 14.2*  PLT 236 236 237   No results found.  CSF 10/2: RBC 4, WBC 310, lymphs 98, monocyte-macrophage 2.  ANA positive, reflex pending  Angiotensin-converting enzyme-negative  ESR, CRP-negative  Vitamin E-pending  C3 pending C4 pending Cryoglobulin - pending HIV antibody-negative  ENA-negative(includes DS DAN, SSA, SSB scl70, smith)  Rheumatoid factor-negative  Neuromyelitis optica antibody-pending  RPR-negative  ANCA - pending  Lyme titers-negative  Cardiolipin antibodies is pending  vitamin E-pending  EBV-pending  HTLV I + II  [place] virus - pending    NMO pending    ASSESSMENT  He is not likely to have a malignancy at age 33. Further ID work up? Sarcoid? ACE negative ? 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  DDx is sarcoid vs lupus, infectious etiology are unlikely to involve lung as well.  All auto-immune serologies are sent and pending.  No need for bronch and biopsy for now.  LP to be done today and will await cytology.  If cytology is negative then will consider EBUS.   I would recommend involving rheumatology in this case given the clinical picture.  PLAN: May require FOB vs EBUS for evaluation of lymphadenopathy if autoimmune workup negative.  Patient is likely to be discharged today 10/5 Can follow up outpatient for procedure required if needed Pulmonary office contact info included on d/c paperwork Staff MD to follow  PGeorgann Housekeeper ACNP LTexas Health Wom[facility] Specialty Surgery CenterPulmonology/Critical Care Pager [phone]

## 2014-08-09 NOTE — Discharge Summary (Signed)
Physician Discharge Summary  MARILYN WING MRN: [id number] DOB/AGE: 1981-03-30 33 y.o.  PCP: No PCP Per Patient   Admit date: 08/04/2014 Discharge date: 08/09/2014  Discharge Diagnoses:  myelitis of unclear etiology  Active Problems:   Asthma, chronic   Solitary pulmonary nodule   Pleural nodule   Mediastinal lymphadenopathy  Follow up recommendations Follow up with PCP in 5-7 days f/u with neurology Dr. Jannifer Franklin on 10/13 Followup with neurology at [facility], this will be your official referral       Medication List         albuterol 108 (90 BASE) MCG/ACT inhaler  Commonly known as:  PROVENTIL HFA;VENTOLIN HFA  Inhale 2 puffs into the lungs every 6 (six) hours as needed. For asthma     methocarbamol 500 MG tablet  Commonly known as:  ROBAXIN  Take 250-500 mg by mouth 3 (three) times daily as needed for muscle spasms.     pantoprazole 40 MG tablet  Commonly known as:  PROTONIX  Take 1 tablet (40 mg total) by mouth daily.     predniSONE 10 MG tablet  Commonly known as:  DELTASONE  prednisone 60 mg daily x 5 days, then 53m x 5 days, then further tapering by outpatient neurologist.     sulfacetamide 10 % ophthalmic solution  Commonly known as:  BLEPH-10  Place 2 drops into the left eye 4 (four) times daily.        Discharge Condition: Stable   Disposition: 01-Home or Self Care   Consults: Neurology Pulmonary  Significant Diagnostic Studies: Dg Chest 2 View  08/04/2014   CLINICAL DATA:  Abnormal C-spine MRI. Differential considerations include sarcoidosis.  EXAM: CHEST  2 VIEW  COMPARISON:  None.  FINDINGS: Lungs are essentially clear. No focal consolidation. No pleural effusion or pneumothorax.  The heart is normal in size.  Mild prominence of the bilateral pulmonary hila. Adenopathy is possible. Right paratracheal stripe is not enlarged.  Visualized osseous structures are within normal limits.  IMPRESSION: Mild prominence of the bilateral pulmonary hila,  possibly reflecting hilar adenopathy, although equivocal.  If further imaging evaluation is desired, consider CT chest with contrast.  Otherwise, no evidence of acute cardiopulmonary disease.   Electronically Signed   By: SJulian HyM.D.   On: 08/04/2014 18:10   Ct Chest W Contrast  08/05/2014   CLINICAL DATA:  Hilar prominence on chest radiates atrophy, query adenopathy. Query sarcoidosis. The patient has a encephalomyelitis based on narrow imaging.  EXAM: CT CHEST WITH CONTRAST  TECHNIQUE: Multidetector CT imaging of the chest was performed during intravenous contrast administration.  CONTRAST:  838mOMNIPAQUE IOHEXOL 300 MG/ML  SOLN  COMPARISON:  Multiple exams, including 08/04/2014  FINDINGS: CT confirms right hilar and infrahilar adenopathy, with a right posterior hilar node measuring 1.7 cm in short axis and a right infrahilar node measuring 1.6 cm in short axis on images 25 and 27, respectively. Additional enlarged infrahilar and hilar nodes are present on the right side. No adenopathy on the left or in the remainder the mediastinum. No axillary or internal mammary adenopathy. No definite regional endobronchial lesion or significant extrinsic narrowing of the airways.  Peripherally in the left lower lobe there is a pleural based slightly irregularly marginated lesion on image 43 of series 3 measuring 1.4 cm at its pleural base and 0.7 cm in height. Adjacent to this is a flat but slightly irregular 2.8 by 0.5 cm subpleural lesion. There is a 4 mm left lower  lobe nodule on image 35 of series 3 which appears noncalcified.  No definite nodular airway thickening.  IMPRESSION: 1. Pathologic right hilar and infrahilar adenopathy but without left hilar or mediastinal adenopathy, and with slightly irregular pleural based lesions medially in the right lower lobe in this patient with known segmental regions of dural enhancement and myelitis throughout the spinal cord and in portions of the brain. A unifying  diagnosis is problematic as the imaging appearance is not considered highly characteristic for a specific disease process. The unilateral hilar adenopathy and lack of nodular airway thickening would be unusual for sarcoidosis with neurosarcoidosis as the underlying cause, but it would also be unusual for this to represent a non-small cell lung cancer with dural metastatic disease and paraneoplastic myelitis and cerebritis given the patient's young age. Correlation with serum ACE levels suggested in working up the sarcoidosis angle although these are not entirely specific. Bronchoscopic biopsy of the lymph nodes may provide useful diagnostic information. With regard to the pleural-based lesions, I am uncertain whether these are true lesions or simply represent regional atelectasis. There is an adjacent 4 mm nodule in the right lower lobe, too small to biopsy percutaneously. Entities such as right-sided tuberculosis with TB involvement of the derived likewise seem unlikely.   Electronically Signed   By: Sherryl Barters M.D.   On: 08/05/2014 10:44   Ct Cervical Spine Wo Contrast  08/01/2014   CLINICAL DATA:  General pain and leg numbness for 3 months  EXAM: CT CERVICAL SPINE WITHOUT CONTRAST  TECHNIQUE: Multidetector CT imaging of the cervical spine was performed without intravenous contrast. Multiplanar CT image reconstructions were also generated.  COMPARISON:  None.  FINDINGS: The alignment is anatomic. The vertebral body heights are maintained. There is no acute fracture. There is no static listhesis. The prevertebral soft tissues are normal. The intraspinal soft tissues are not fully imaged on this examination due to poor soft tissue contrast, but there is no gross soft tissue abnormality.  The disc spaces are maintained. There is a mild broad-based disc bulge at C3-4 and C4-5.  The visualized portions of the lung apices demonstrate no focal abnormality.  IMPRESSION: No acute osseous injury of the cervical  spine.   Electronically Signed   By: Kathreen Devoid   On: 08/01/2014 00:55   Ct Lumbar Spine Wo Contrast  08/01/2014   CLINICAL DATA:  Low back pain and numbness  EXAM: CT LUMBAR SPINE WITHOUT CONTRAST  TECHNIQUE: Multidetector CT imaging of the lumbar spine was performed without intravenous contrast administration. Multiplanar CT image reconstructions were also generated.  COMPARISON:  None.  FINDINGS: The vertebral body heights are maintained. The alignment is anatomic. The paravertebral soft tissues are normal. There is no fracture or static listhesis. There is no spondylolysis. The disc spaces are preserved. The visualized portion of the SI joints are unremarkable.  T12-L1: There is no significant disc bulge, foraminal stenosis or central canal stenosis.  L1-L2: There is no significant disc bulge, foraminal stenosis or central canal stenosis.  L2-L3: There is no significant disc bulge, foraminal stenosis or central canal stenosis.  L3-L4: There is no significant disc bulge, foraminal stenosis or central canal stenosis.  L4-L5: There is no significant disc bulge, foraminal stenosis or central canal stenosis.  L5-S1: There is no significant disc bulge, foraminal stenosis or central canal stenosis.  IMPRESSION: Normal CT of the lumbar spine.   Electronically Signed   By: Kathreen Devoid   On: 08/01/2014 01:02   Mr  Brain W Wo Contrast  08/05/2014   ADDENDUM REPORT: 08/05/2014 12:20  ADDENDUM: In comparison with the subsequent CT, a right hilar adenopathy is evident. Posterior medial pleural disease is also present in the right lower lobe. This may correspond with an infectious etiology.  Although the CT scan is not typical of sarcoid, sarcoidosis should also be considered within the spine.   Electronically Signed   By: Lawrence Santiago M.D.   On: 08/05/2014 12:20   08/05/2014   CLINICAL DATA:  Abnormal MRI of the cervical spine. Fall 2 months ago. Neck injury. Back and neck pain. Positive Lhermitte's sign.  EXAM:  MRI HEAD WITHOUT AND WITH CONTRAST  MRI CERVICAL SPINE WITH CONTRAST  MRI THORACIC SPINE WITHOUT AND WITH CONTRAST  TECHNIQUE: Multiplanar, multiecho pulse sequences of the brain and surrounding structures, and cervical and thoracic spine, to include the craniocervical junction , were obtained without and with intravenous contrast.  CONTRAST:  104mL MULTIHANCE GADOBENATE DIMEGLUMINE 529 MG/ML IV SOLN  COMPARISON:  MRI cervical spine from the same day.  FINDINGS: MRI HEAD FINDINGS  A focal area of subcortical T2 hyperintensity is present in the posterior right frontal lobe. Abnormal signal is present along the genu of the corpus callosum, left greater than right.  There is associated enhancement along the posterior genu of the corpus callosum left greater than right. No other significant focal enhancement is present.  There is subtle diffuse periventricular white matter change bilaterally without significant associated enhancement. Mild dural enhancement is present throughout, most prominent over the left tentorium.  Flow is present in the major intracranial arteries. The globes and orbits are intact. Mild mucosal thickening is present in the left frontal sinus in the anterior ethmoid air cells bilaterally. The mastoid air cells are clear.  The brain is otherwise unremarkable. No acute infarct, hemorrhage, or mass lesion is present. The ventricles are of normal size.  MRI CERVICAL SPINE FINDINGS  The postcontrast images of the cervical spine demonstrate symmetric lateral enhancement from the craniocervical junction to the C1-2 level. There is asymmetric left-sided posterior lateral dural enhancement at the level of C4. Diffuse dorsal enhancement is evident from C5 through T2. This involves both the dorsal aspect of the cord and the dura. There is no pathologic enhancement of the vertebral bodies.  MRI THORACIC SPINE FINDINGS  There is diffuse cord signal abnormality in some expansion through the entire thoracic cord  to the level of the conus medullaris which terminates at T12. Marrow signal, vertebral body heights, alignment are normal. Segment focal areas of dorsal cord in dural enhancement are present from T4 through T7 and again most notably from T9-10 through T11-12. There is some degree of diffuse dural enhancement throughout the thoracic spinal cord. Portions of the dorsal cord enhance as well.  There is no focal disc protrusion or central canal stenosis. The foramina are widely patent bilaterally.  IMPRESSION: 1. The findings are nonspecific but suggestive of ADEM. Given the extensive segmental areas of dural enhancement, an infectious myelitis must be considered. Neuromyelitis optica is considered. Without other etiology, this would be considered idiopathic acute transverse myelitis. Multiple sclerosis is considered less likely. This does not appear to be a neoplastic process. 2. Subtle T2 changes in the brain are less prominent than in the thoracic spine and likely related to the same process. 3. Mild diffuse dural enhancement is present over the brain and spinal cord. 4. Focal enhancement is present along the up minimal surface of the left lateral ventricle as  described. These results were called by telephone at the time of interpretation on 08/04/2014 at 8:51 pm to Dr. Wallie Char, who verbally acknowledged these results.  Electronically Signed: By: Lawrence Santiago M.D. On: 08/04/2014 20:52   Mr Cervical Spine Wo Contrast  08/04/2014   CLINICAL DATA:  Neck pain with weakness and numbness in both legs, left greater than right. Fall in bathtub July, 2015.  EXAM: MRI CERVICAL SPINE WITHOUT CONTRAST  TECHNIQUE: Multiplanar, multisequence MR imaging of the cervical spine was performed. No intravenous contrast was administered.  COMPARISON:  08/01/2014 CT scan  FINDINGS: Prominent symmetric cord expansion and edema signal extending from the C2 level down through at least the T8 level is observed, with corresponding  low T1 signal in this large segment of the cord. No middle or lower upon teen abnormal signal is observed.  The craniocervical junction appears unremarkable. No vertebral subluxation is observed. No significant vertebral marrow edema is identified. Additional findings at individual levels are as follows:  C2-3:  Unremarkable.  C3-4: Mild disc bulge slightly indents the anterior cord but only because the cord is expanded.  C4-5: Mild disc bulge indents the anterior cord but only because the cord is expanded.  C5-6: Mild disc bulge indents the anterior cord but only because the cord is expanded.  C6-7: Small central disc protrusion indents the anterior cord but only because the cord is expanded.  C7-T1:  Unremarkable.  We obtained a limited due view of the upper thoracic spine on sagittal images which included down to T8 on T2 weighted images. Please be aware that this does not constitute a diagnostic evaluation of the thoracic spine and only gets a brief and unreliable look at the upper thoracic spine. This does appear to show the cord edema signal/expansion extending down at least to the T8 level.  IMPRESSION: 1. Prominent abnormal edema signal and expansion of the spinal cord extending from the C2 level at least down to the T8 level. Differential diagnostic considerations include HIV myelitis, postviral/post vaccination autoimmune myelitis, Devic's disease, or demyelinating process. Tumor seems unlikely given the extended involvement. Further imaging workup is recommended, to include MRI of the brain with and without contrast; MRI thoracic spine with and without contrast; and post-contrast images of the cervical spine for further characterization. Neurology consultation is also recommended. I have contacted by telephone the flow managers associated with the [facility] emergency room in order to track down and contact the patient and have the patient immediately proceed to the [facility] for  further neurologic assessment and imaging workup.   Electronically Signed   By: Sherryl Barters M.D.   On: 08/04/2014 13:03   Mr Cervical Spine W Contrast  08/05/2014   ADDENDUM REPORT: 08/05/2014 12:20  ADDENDUM: In comparison with the subsequent CT, a right hilar adenopathy is evident. Posterior medial pleural disease is also present in the right lower lobe. This may correspond with an infectious etiology.  Although the CT scan is not typical of sarcoid, sarcoidosis should also be considered within the spine.   Electronically Signed   By: Lawrence Santiago M.D.   On: 08/05/2014 12:20   08/05/2014   CLINICAL DATA:  Abnormal MRI of the cervical spine. Fall 2 months ago. Neck injury. Back and neck pain. Positive Lhermitte's sign.  EXAM: MRI HEAD WITHOUT AND WITH CONTRAST  MRI CERVICAL SPINE WITH CONTRAST  MRI THORACIC SPINE WITHOUT AND WITH CONTRAST  TECHNIQUE: Multiplanar, multiecho pulse sequences of the brain and  surrounding structures, and cervical and thoracic spine, to include the craniocervical junction , were obtained without and with intravenous contrast.  CONTRAST:  73mL MULTIHANCE GADOBENATE DIMEGLUMINE 529 MG/ML IV SOLN  COMPARISON:  MRI cervical spine from the same day.  FINDINGS: MRI HEAD FINDINGS  A focal area of subcortical T2 hyperintensity is present in the posterior right frontal lobe. Abnormal signal is present along the genu of the corpus callosum, left greater than right.  There is associated enhancement along the posterior genu of the corpus callosum left greater than right. No other significant focal enhancement is present.  There is subtle diffuse periventricular white matter change bilaterally without significant associated enhancement. Mild dural enhancement is present throughout, most prominent over the left tentorium.  Flow is present in the major intracranial arteries. The globes and orbits are intact. Mild mucosal thickening is present in the left frontal sinus in the anterior ethmoid  air cells bilaterally. The mastoid air cells are clear.  The brain is otherwise unremarkable. No acute infarct, hemorrhage, or mass lesion is present. The ventricles are of normal size.  MRI CERVICAL SPINE FINDINGS  The postcontrast images of the cervical spine demonstrate symmetric lateral enhancement from the craniocervical junction to the C1-2 level. There is asymmetric left-sided posterior lateral dural enhancement at the level of C4. Diffuse dorsal enhancement is evident from C5 through T2. This involves both the dorsal aspect of the cord and the dura. There is no pathologic enhancement of the vertebral bodies.  MRI THORACIC SPINE FINDINGS  There is diffuse cord signal abnormality in some expansion through the entire thoracic cord to the level of the conus medullaris which terminates at T12. Marrow signal, vertebral body heights, alignment are normal. Segment focal areas of dorsal cord in dural enhancement are present from T4 through T7 and again most notably from T9-10 through T11-12. There is some degree of diffuse dural enhancement throughout the thoracic spinal cord. Portions of the dorsal cord enhance as well.  There is no focal disc protrusion or central canal stenosis. The foramina are widely patent bilaterally.  IMPRESSION: 1. The findings are nonspecific but suggestive of ADEM. Given the extensive segmental areas of dural enhancement, an infectious myelitis must be considered. Neuromyelitis optica is considered. Without other etiology, this would be considered idiopathic acute transverse myelitis. Multiple sclerosis is considered less likely. This does not appear to be a neoplastic process. 2. Subtle T2 changes in the brain are less prominent than in the thoracic spine and likely related to the same process. 3. Mild diffuse dural enhancement is present over the brain and spinal cord. 4. Focal enhancement is present along the up minimal surface of the left lateral ventricle as described. These results  were called by telephone at the time of interpretation on 08/04/2014 at 8:51 pm to Dr. Wallie Char, who verbally acknowledged these results.  Electronically Signed: By: Lawrence Santiago M.D. On: 08/04/2014 20:52   Mr Thoracic Spine W Wo Contrast  08/05/2014   ADDENDUM REPORT: 08/05/2014 12:20  ADDENDUM: In comparison with the subsequent CT, a right hilar adenopathy is evident. Posterior medial pleural disease is also present in the right lower lobe. This may correspond with an infectious etiology.  Although the CT scan is not typical of sarcoid, sarcoidosis should also be considered within the spine.   Electronically Signed   By: Lawrence Santiago M.D.   On: 08/05/2014 12:20   08/05/2014   CLINICAL DATA:  Abnormal MRI of the cervical spine. Fall 2 months ago.  Neck injury. Back and neck pain. Positive Lhermitte's sign.  EXAM: MRI HEAD WITHOUT AND WITH CONTRAST  MRI CERVICAL SPINE WITH CONTRAST  MRI THORACIC SPINE WITHOUT AND WITH CONTRAST  TECHNIQUE: Multiplanar, multiecho pulse sequences of the brain and surrounding structures, and cervical and thoracic spine, to include the craniocervical junction , were obtained without and with intravenous contrast.  CONTRAST:  54mL MULTIHANCE GADOBENATE DIMEGLUMINE 529 MG/ML IV SOLN  COMPARISON:  MRI cervical spine from the same day.  FINDINGS: MRI HEAD FINDINGS  A focal area of subcortical T2 hyperintensity is present in the posterior right frontal lobe. Abnormal signal is present along the genu of the corpus callosum, left greater than right.  There is associated enhancement along the posterior genu of the corpus callosum left greater than right. No other significant focal enhancement is present.  There is subtle diffuse periventricular white matter change bilaterally without significant associated enhancement. Mild dural enhancement is present throughout, most prominent over the left tentorium.  Flow is present in the major intracranial arteries. The globes and orbits are  intact. Mild mucosal thickening is present in the left frontal sinus in the anterior ethmoid air cells bilaterally. The mastoid air cells are clear.  The brain is otherwise unremarkable. No acute infarct, hemorrhage, or mass lesion is present. The ventricles are of normal size.  MRI CERVICAL SPINE FINDINGS  The postcontrast images of the cervical spine demonstrate symmetric lateral enhancement from the craniocervical junction to the C1-2 level. There is asymmetric left-sided posterior lateral dural enhancement at the level of C4. Diffuse dorsal enhancement is evident from C5 through T2. This involves both the dorsal aspect of the cord and the dura. There is no pathologic enhancement of the vertebral bodies.  MRI THORACIC SPINE FINDINGS  There is diffuse cord signal abnormality in some expansion through the entire thoracic cord to the level of the conus medullaris which terminates at T12. Marrow signal, vertebral body heights, alignment are normal. Segment focal areas of dorsal cord in dural enhancement are present from T4 through T7 and again most notably from T9-10 through T11-12. There is some degree of diffuse dural enhancement throughout the thoracic spinal cord. Portions of the dorsal cord enhance as well.  There is no focal disc protrusion or central canal stenosis. The foramina are widely patent bilaterally.  IMPRESSION: 1. The findings are nonspecific but suggestive of ADEM. Given the extensive segmental areas of dural enhancement, an infectious myelitis must be considered. Neuromyelitis optica is considered. Without other etiology, this would be considered idiopathic acute transverse myelitis. Multiple sclerosis is considered less likely. This does not appear to be a neoplastic process. 2. Subtle T2 changes in the brain are less prominent than in the thoracic spine and likely related to the same process. 3. Mild diffuse dural enhancement is present over the brain and spinal cord. 4. Focal enhancement is  present along the up minimal surface of the left lateral ventricle as described. These results were called by telephone at the time of interpretation on 08/04/2014 at 8:51 pm to Dr. Wallie Char, who verbally acknowledged these results.  Electronically Signed: By: Lawrence Santiago M.D. On: 08/04/2014 20:52   Dg Fluoro Guide Lumbar Puncture  08/06/2014   CLINICAL DATA:  Myelitis, not otherwise specified  EXAM: DIAGNOSTIC LUMBAR PUNCTURE UNDER FLUOROSCOPIC GUIDANCE  FLUOROSCOPY TIME:  8 seconds  PROCEDURE: Informed consent was obtained from the patient prior to the procedure, including potential complications of headache, allergy, and pain. With the patient prone, the lower back  was prepped with Betadine. 1% Lidocaine was used for local anesthesia. Lumbar puncture was performed at the L4-5 level using a 20 gauge needle with return of clear CSF with an opening pressure of 15 cm water. 44m of CSF were obtained for laboratory studies. The patient tolerated the procedure well and there were no apparent complications.  IMPRESSION: Successful fluoroscopic guided lumbar puncture.   Electronically Signed   By: HKathreen Devoid  On: 08/06/2014 13:04      Microbiology: Recent Results (from the past 240 hour(s))  CSF CULTURE     Status: None   Collection Time    08/06/14 11:55 AM      Result Value Ref Range Status   Specimen Description CSF   Final   Special Requests 5.0ML FLUID   Final   Gram Stain     Final   Value: WBC PRESENT, PREDOMINANTLY MONONUCLEAR     NO ORGANISMS SEEN     [facility]PIN [facility]    [facility]  Culture     Final   Value: NO GROWTH 2 DAYS     [facility]  Report Status PENDING   Incomplete  GRAM STAIN     Status: None   Collection Time    08/06/14 11:55 AM      Result Value Ref Range Status   Specimen Description CSF   Final   Special Requests 5.0ML FLUID   Final   Gram Stain     Final   Value: CYTOSPIN PREP      WBC PRESENT, PREDOMINANTLY MONONUCLEAR     NO ORGANISMS SEEN   Report Status 08/06/2014 FINAL   Final     Labs: Results for orders placed during the hospital encounter of 08/04/14 (from the past 48 hour(s))  COMPREHENSIVE METABOLIC PANEL     Status: Abnormal   Collection Time    08/08/14  4:14 AM      Result Value Ref Range   Sodium 137  137 - 147 mEq/L   Potassium 4.3  3.7 - 5.3 mEq/L   Chloride 98  96 - 112 mEq/L   CO2 28  19 - 32 mEq/L   Glucose, Bld 134 (*) 70 - 99 mg/dL   BUN 15  6 - 23 mg/dL   Creatinine, Ser 0.98  0.50 - 1.35 mg/dL   Calcium 9.1  8.4 - 10.5 mg/dL   Total Protein 7.0  6.0 - 8.3 g/dL   Albumin 3.5  3.5 - 5.2 g/dL   AST 18  0 - 37 U/L   ALT 32  0 - 53 U/L   Alkaline Phosphatase 72  39 - 117 U/L   Total Bilirubin 0.5  0.3 - 1.2 mg/dL   GFR calc non Af Amer >90  >90 mL/min   GFR calc Af Amer >90  >90 mL/min   Comment: (NOTE)     The eGFR has been calculated using the CKD EPI equation.     This calculation has not been validated in all clinical situations.     eGFR's persistently <90 mL/min signify possible Chronic Kidney     Disease.   Anion gap 11  5 - 15  CBC     Status: Abnormal   Collection Time    08/08/14  4:14 AM      Result Value Ref Range   WBC 14.2 (*) 4.0 - 10.5 K/uL   RBC 4.90  4.22 -  5.81 MIL/uL   Hemoglobin 13.2  13.0 - 17.0 g/dL   HCT 37.4 (*) 39.0 - 52.0 %   MCV 76.3 (*) 78.0 - 100.0 fL   MCH 26.9  26.0 - 34.0 pg   MCHC 35.3  30.0 - 36.0 g/dL   RDW 13.6  11.5 - 15.5 %   Platelets 237  150 - 400 K/uL  TROPONIN I     Status: None   Collection Time    08/08/14 11:05 AM      Result Value Ref Range   Troponin I <0.30  <0.30 ng/mL   Comment:            Due to the release kinetics of cTnI,     a negative result within the first hours     of the onset of symptoms does not rule out     myocardial infarction with certainty.     If myocardial infarction is still suspected,     repeat the test at appropriate intervals.     HPI  : Gregory Fuller is an 33 y.o. male who fell in the tube 2 months ago. He noted he slipped and when he landed he landed on his neck. He did not lose consciousness. A few days after his fall he noted both back and neck pain. He also noted when he looked down he had a electrical shock like sen sensation that went down his back. He seeked medical attention and was given a shot of steroids which seemed to help. A few days later his pain returned and he went to urgent care where he was give a 10 mg steroid dose pack. This seemed to help the pain, but again when he finished the dose pack his neck pain returned. Patient obtained a MRI of cervical spine today which demonstrated abnormal cord signal from C2-T8.   HOSPITAL COURSE  32 year old male with myelitis of unclear etiology. Differential diagnosis includes multiple sclerosis, neuromyelitis optica, infectious myelitis, autoimmune disease, metabolic myelopathy. The longitudinally extensive nature of this as well as dural involvement , increases probability of autoimmune etiology,. The time course would be very unusual for infectious etiology given that has been present now for months.   Following workup was initiated by neurology ANA positive, titer 1:40, essentially negative  Angiotensin-converting enzyme-negative  ESR, CRP-negative  Vitamin E-pending  HIV antibody-negative  ENA-negative(includes DS DAN, SSA, SSB scl70, smith)  Rheumatoid factor-negative  Neuromyelitis optica antibody-pending  RPR-negative  ANCA - c-anca positive, elisa for MPO and PR3 antibodies however were negative.  Lyme titers-negative  Cardiolipin antibodies is pending  vitamin E-pending  EBV-pending  HTLV I + II  West nile virus - pending  NMO pending  CSF:  WBC 310 (no concern for meningitis) RBC 4  Protein 114  Glucose 85  MTB by pcr  HSV - negative  CMV - negative.  Enterovirus - negative  Cytology is pending.     The presence of c-ANCA is difficult to  interpret. He does not have clear findings of vasculitis. PR3 and MPO antibodies by elisa are negative so the interpretation of this result I feel is very unclear.  Given the duration and mild symptoms, continue to suspect autoimmune as opposed to other etiologies. Urology does not feel that he needs to stay in the hospital any longer, but can follow up as an outpatient.   Recommendations:  1) Finished high dose steroids, I would favor a long taper  2) prednisone 60 mg daily x 5  days, then 16m x 5 days, then further tapering by outpatient neurologist.  3) f/u with Dr. WJannifer Franklinon 10/13 4. followup with pulmonary 08/20/2014 at 1:30 PM to see Dr.Murali RChase Caller MD  [facility] for hilar adenopathy because of concern for lymphoma       Discharge Exam: * Blood pressure 152/89, pulse 57, temperature 97.9 F (36.6 C), temperature source Axillary, resp. rate 18, height _0  (1.676 m), weight 92.761 kg (204 lb 8 oz), SpO2 100.00%.  General: WNWD AAM . No acute distress, walking around room  Neuro: Alert, oriented, no focal deficit  HEENT: No LAN/JVD, oral pharynx unremarkable  Cardiovascular: HSR RRR  Lungs: CTA  Abdomen: Soft NT  Musculoskeletal: No acute deformity or ROM limitation.  Skin: warm and dry           Follow-up Information   Follow up with WLenor Coffin MD On 08/17/2014. (be there at 1:30 for a 2 PM appointment)    Specialty:  Neurology   Contact information:   [phone]      [facility]    Specialty:  Pulmonary Disease   Contact information:   [place] [phone]      Follow up with Primary care provider. Schedule an appointment as soon as possible for a visit in 1 week.      [facility])        Signed: [place] Revoir 08/09/2014, 11:47 AM

## 2014-08-09 NOTE — Treatment Plan (Signed)
    Gregory Fuller  DOB/AGE: 1981/06/27 33 y.o.   To Whom It May Concern Patient was admitted from 9/30-10/5 Patient able to go back to work tomorrow 10/6 Or lifting heavy weights greater than 10 pounds until cleared by neurology Dr. Jannifer Franklin    Signed:  [name]  08/09/2014, 11:47 AM

## 2014-08-10 LAB — NEUROMYELITIS OPTICA AUTOAB, IGG: NMO-IgG: NEGATIVE

## 2014-08-10 LAB — EPSTEIN BARR VRS(EBV DNA BY PCR): EBV DNA QN by PCR: <200 copies/mL

## 2014-08-10 LAB — C3 COMPLEMENT: C3 COMPLEMENT: 127 mg/dL (ref 90–180)

## 2014-08-10 LAB — CARDIOLIPIN ANTIBODY: PHOSPHOLIPIDS: 187 mg/dL (ref 151–264)

## 2014-08-10 LAB — M. TUBERCULOSIS COMPLEX BY PCR: M. tuberculosis, Direct: NOT DETECTED

## 2014-08-10 LAB — VDRL, CSF: VDRL Quant, CSF: NONREACTIVE

## 2014-08-10 LAB — C4 COMPLEMENT: COMPLEMENT C4, BODY FLUID: 35 mg/dL (ref 10–40)

## 2014-08-13 LAB — OLIGOCLONAL BANDS, CSF + SERM

## 2014-08-13 LAB — CRYOGLOBULIN

## 2014-08-17 ENCOUNTER — Ambulatory Visit (INDEPENDENT_AMBULATORY_CARE_PROVIDER_SITE_OTHER): Payer: 59 | Admitting: Neurology

## 2014-08-17 ENCOUNTER — Encounter: Payer: Self-pay | Admitting: Neurology

## 2014-08-17 VITALS — BP 133/81 | HR 86

## 2014-08-17 DIAGNOSIS — G373 Acute transverse myelitis in demyelinating disease of central nervous system: Secondary | ICD-10-CM

## 2014-08-17 DIAGNOSIS — G0489 Other myelitis: Secondary | ICD-10-CM

## 2014-08-17 MED ORDER — GABAPENTIN 300 MG PO CAPS: 300.0000 mg | ORAL_CAPSULE | Freq: Two times a day (BID) | ORAL | Status: DC

## 2014-08-17 NOTE — Patient Instructions (Signed)
Transverse Myelitis °Transverse myelitis is a disorder of the spinal cord. Inflammation in the spinal cord can hurt the fatty substance (myelin) that covers the nerve fibers. This would be similar to losing the insulation around electrical wires. This damage causes problems with the nerve cells (neurons) in the spinal cord that carry signals from the brain to the rest of the body. °The part of the spinal cord affected determines which parts of the body are affected. An injury to the spinal cord at the level of the chest can cause problems with leg movement and bowel and bladder control. An injury to the spinal cord at the level of the neck can cause problems with sensation and motor function of the arms, legs, and bowel and bladder control. If an injury is high enough in the neck, it may even affect breathing. °Some patients recover from this disorder with minor or no lasting problems. Others may have lasting problems that make daily living difficult. Most patients will have only 1 episode of transverse myelitis. A small percentage may have problems return. °CAUSES  °The exact cause is not known. Some of the things thought to cause this problem include: °· Viral infections, such as herpes, chickenpox (varicella zoster), cytomegalovirus, and Epstein-Barr virus. °· Abnormal immune reactions. °· Poor blood flow to the spinal cord. °This disorder may also occur as a complication of: °· Syphilis. °· Measles. °· Lyme disease. °· Chickenpox, if severe. °· Some vaccinations, including those for chickenpox and rabies. °SYMPTOMS  °Symptoms can come on over several hours to weeks. Transverse myelitis may cause a loss of spinal cord function over several hours to several weeks. Some symptoms are: °· Headaches. °· Fever. °· Loss of appetite. °· A sudden onset of lower back pain. °· Abnormal sensations in the toes and feet. °· Weakness in the arms and legs. °· Bowel and urinary problems. °DIAGNOSIS  °· Medical history and  neurological exam. °· Magnetic resonance imaging (MRI). This procedure provides a picture of the brain and spinal cord. °· Myelography. This procedure involves injecting a dye and taking X-rays. °· Blood tests may be performed. °· A spinal tap may be done to examine the spinal fluid. °TREATMENT  °No cure exists for this illness. Treatments are designed to help decrease the symptoms. °· Corticosteroid treatment is often used during the first few weeks of illness. This decreases inflammation. °· Pain medicines are prescribed as needed. °· Sometimes, respirators are needed if there is severe difficulty breathing. °· Physical therapy may be used to keep muscles flexible and strong. °· Movement decreases the chances of pressure sores developing. °· Later, if patients begin to recover limb control, physical therapy helps begin to improve muscle strength, coordination, and range of motion. °HOME CARE INSTRUCTIONS  °Much can be done for people with permanent disabilities. °· Treatment programs and other resources are likely available in your community. Medical social workers can help you locate this information. °· Rehabilitative therapy will teach you how to care for yourself. Rehabilitation cannot reverse the physical damage resulting from this illness. However, it can help people, even those with severe paralysis, become as independent as possible. This helps you attain the best possible quality of life. °· Medicines can be prescribed by your caregiver. These medicines can help with depression and adapting to a changed way of life. A wide variety of drugs now exist that can help with the pain from spinal cord injuries. °· Increasing your strength and endurance is possible. Treatment improves coordination and reduces   muscle spasms (spasticity) and muscle wasting in paralyzed limbs. Physical therapists can help you regain greater control over bladder and bowel function through various exercises. They can also teach  paralyzed patients techniques for using assistive devices (wheelchairs, canes, braces) as effectively as possible. °· Learning new ways of performing everyday tasks is important. These tasks include bathing, dressing, preparing a meal, house cleaning, engaging in arts and crafts, or gardening. Occupational therapists can help you with this. °FOR MORE INFORMATION °National Institute of Neurological Disorders and Stroke: www.ninds.nih.gov °Transverse Myelitis Association: www.myelitis.org °American Chronic Pain Association: www.theacpa.org °[place] Project to Cure Paralysis: www.themiamiproject.org °National Rehabilitation Information Center: www.naric.com °Document Released: 10/12/2002 Document Revised: 01/14/2012 Document Reviewed: 01/18/2010 °ExitCare® Patient Information ©2015 [employer]. This information is not intended to replace advice given to you by your health care provider. Make sure you discuss any questions you have with your health care provider. ° °

## 2014-08-17 NOTE — Progress Notes (Signed)
Reason for visit: Transverse myelitis  Gregory Fuller is a 33 y.o. male  History of present illness:  [name] is a 33 year old black male with a history of onset of numbness in the lower extremities dating back to July 2015. The patient had a fall in the bathtub at home in July. The patient developed some back discomfort since that time that he thought was related to the fall. The patient continued to have sensory symptoms in the lower extremities, and some issues with constipation. The patient has not had any problems controlling the bladder. The patient has not had any visual complaints. When he flexes the neck down, he may have sensory alteration into the right face, and a pulling sensation down both legs. The patient was recently in the hospital for an evaluation when MRI evaluation showed extensive involvement of the spinal cord, with dural enhancement. The patient has been treated with steroids, and he in general is doing fairly well. The patient has undergone extensive blood work and spinal fluid analysis. The spinal fluid analysis shows 310 white cells, predominantly lymphocytes, and red blood cells are 5. The patient had greater than 5 oligoclonal bands in the spinal fluid. The angiotensin converting enzyme level on the blood work was normal. NMO antibodies were negative. The patient has had some mild improvement in his sensory symptoms, but he still has some discomfort in the back. The patient comes to this office for an evaluation. He reports no visual symptoms. He has not had any fevers or chills, and no confusion or altered mental status or seizures.  Past Medical History  Diagnosis Date  . Asthma   . Subacute transverse myelitis     Past Surgical History  Procedure Laterality Date  . No past surgeries      Family History  Problem Relation Age of Onset  . Diabetes Other   . Thyroid disease Mother   . Seizures Father     Social history:  reports that he has never smoked.  He has never used smokeless tobacco. He reports that he drinks alcohol. He reports that he does not use illicit drugs.  Medications:  Current Outpatient Prescriptions on File Prior to Visit  Medication Sig Dispense Refill  . albuterol (PROVENTIL HFA;VENTOLIN HFA) 108 (90 BASE) MCG/ACT inhaler Inhale 2 puffs into the lungs every 6 (six) hours as needed. For asthma       . pantoprazole (PROTONIX) 40 MG tablet Take 1 tablet (40 mg total) by mouth daily.  60 tablet  0  . predniSONE (DELTASONE) 10 MG tablet prednisone 60 mg daily x 5 days, then 40mg  x 5 days, then further tapering by outpatient neurologist.  200 tablet  0  . sulfacetamide (BLEPH-10) 10 % ophthalmic solution Place 2 drops into the left eye 4 (four) times daily.       No current facility-administered medications on file prior to visit.      Allergies  Allergen Reactions  . Vicodin [Hydrocodone-Acetaminophen] Other (See Comments)    Made patient feel like he was in more pain    ROS:  Out of a complete 14 system review of symptoms, the patient complains only of the following symptoms, and all other reviewed systems are negative.  Muscle cramps, Achy muscles Skin sensitivity Numbness  Blood pressure 133/81, pulse 86.  Physical Exam  General: The patient is alert and cooperative at the time of the examination.  Eyes: Pupils are equal, round, and reactive to light. Discs are flat  bilaterally.  Neck: The neck is supple, no carotid bruits are noted.  Respiratory: The respiratory examination is clear.  Cardiovascular: The cardiovascular examination reveals a regular rate and rhythm, no obvious murmurs or rubs are noted.  Skin: Extremities are without significant edema.  Neurologic Exam  Mental status: The patient is alert and oriented x 3 at the time of the examination. The patient has apparent normal recent and remote memory, with an apparently normal attention span and concentration ability.  Cranial nerves: Facial  symmetry is present. There is good sensation of the face to pinprick and soft touch bilaterally. The strength of the facial muscles and the muscles to head turning and shoulder shrug are normal bilaterally. Speech is well enunciated, no aphasia or dysarthria is noted. Extraocular movements are full. Visual fields are full. The tongue is midline, and the patient has symmetric elevation of the soft palate. No obvious hearing deficits are noted.  Motor: The motor testing reveals 5 over 5 strength of all 4 extremities. Good symmetric motor tone is noted throughout.  Sensory: Sensory testing is intact to pinprick, soft touch, vibration sensation, and position sense on all 4 extremities, with the exception that there is a slight decrease in vibration sensation on the right foot, slight decrease in position sensation on the left foot. There is a relative sensory level to pinprick at the T4 level. No evidence of extinction is noted.  Coordination: Cerebellar testing reveals good finger-nose-finger and heel-to-shin bilaterally.  Gait and station: Gait is normal. Tandem gait is slightly unsteady. Romberg is negative. No drift is seen.  Reflexes: Deep tendon reflexes are symmetric and normal bilaterally. Toes are downgoing bilaterally.    MRI brain, cervical spine and thoracic spine 08/05/14:  IMPRESSION:  1. The findings are nonspecific but suggestive of ADEM. Given the  extensive segmental areas of dural enhancement, an infectious  myelitis must be considered. Neuromyelitis optica is considered.  Without other etiology, this would be considered idiopathic acute  transverse myelitis. Multiple sclerosis is considered less likely.  This does not appear to be a neoplastic process.  2. Subtle T2 changes in the brain are less prominent than in the  thoracic spine and likely related to the same process.  3. Mild diffuse dural enhancement is present over the brain and  spinal cord.  4. Focal enhancement is  present along the up minimal surface of the  left lateral ventricle as described.    Assessment/Plan:  1. Transverse myelitis  The clinical presentation is unusual for this patient. The patient had a gradual onset of symptoms since July 2015, with extensive involvement of the spinal cord, meningeal enhancement, and a single brain lesion that is enhancing. The patient has a negative NMO antibody, and oligoclonal banding in the spinal fluid that would go against the diagnosis of neuromyelitis optica and ADEM. The patient may have an infectious source of his transverse myelitis, and infections such as mycoplasma and CMV need to be considered. Multiple sclerosis is still a possibility, but the MRI characteristics are highly unusual for this entity. The patient will be set up for a visual evoked response test. He will continue the steroid taper, we will follow the MRI evaluation over time. I will see the patient back in 2-3 months. Work up in the hospital was extensive, antibodies for EBV, CMV, Lyme, Herpes, West nile virus, Enterovirus and HIV were negative. Cytology was negative. ANA was positive.   Jill Alexanders MD 08/17/2014 7:48 PM  [facility] [phone]  [address] [place], [place] [address]  Phone [phone] Fax [phone]

## 2014-08-18 ENCOUNTER — Telehealth: Payer: Self-pay | Admitting: Neurology

## 2014-08-18 ENCOUNTER — Telehealth: Payer: Self-pay | Admitting: *Deleted

## 2014-08-18 DIAGNOSIS — G0490 Encephalitis and encephalomyelitis, unspecified: Secondary | ICD-10-CM

## 2014-08-18 NOTE — Telephone Encounter (Signed)
I called patient.  I left a message, I will call back later. 

## 2014-08-18 NOTE — Telephone Encounter (Signed)
Patient returning call to Dr. Jannifer Franklin, please call and advise.

## 2014-08-18 NOTE — Telephone Encounter (Signed)
I called patient. The patient wishes to have a second opinion from a Dr. Ancil Linsey in [place], telephone number is [phone] or [phone]. I will make a referral.

## 2014-08-18 NOTE — Telephone Encounter (Signed)
I called patient. I have reviewed medical records last evening. The MRI of the brain and spine show enhancement of the dural membranes. 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 The patient had a positive ANA, and a P-ANCA. I'll send the patient back for further evaluation with blood work. I will check a repeat scan of the brain and spinal cord in 6 weeks. The patient will continue his prednisone taper, and he will call me if he feels that he is getting worse over time. A repeat lumbar puncture will need to be done in the future.  The patient indicates that he has 2 dogs, no cats. He has not had any travel outside of the [place]. He denies any history of ulcers on the mouth or genitals. He has had some left hip discomfort.  On future spinal fluid analysis, evaluation for TB, mycoplasma, listeria, and possibly for an amoebic infection needs to be considered.

## 2014-08-18 NOTE — Telephone Encounter (Signed)
Patient states that he is returning your call and would like to ask you some questions, patient would not go into detail.

## 2014-08-19 ENCOUNTER — Emergency Department (HOSPITAL_COMMUNITY)
Admission: EM | Admit: 2014-08-19 | Discharge: 2014-08-20 | Disposition: A | Discharge status: Home / Self Care | Payer: 59 | Attending: Emergency Medicine | Admitting: Emergency Medicine

## 2014-08-19 ENCOUNTER — Encounter (HOSPITAL_COMMUNITY): Payer: Self-pay | Admitting: Emergency Medicine

## 2014-08-19 ENCOUNTER — Telehealth: Payer: Self-pay | Admitting: Neurology

## 2014-08-19 DIAGNOSIS — Z79899 Other long term (current) drug therapy: Secondary | ICD-10-CM | POA: Insufficient documentation

## 2014-08-19 DIAGNOSIS — R079 Chest pain, unspecified: Secondary | ICD-10-CM | POA: Diagnosis present

## 2014-08-19 DIAGNOSIS — G373 Acute transverse myelitis in demyelinating disease of central nervous system: Secondary | ICD-10-CM | POA: Insufficient documentation

## 2014-08-19 DIAGNOSIS — Z7952 Long term (current) use of systemic steroids: Secondary | ICD-10-CM | POA: Insufficient documentation

## 2014-08-19 DIAGNOSIS — R0789 Other chest pain: Secondary | ICD-10-CM | POA: Insufficient documentation

## 2014-08-19 DIAGNOSIS — J45909 Unspecified asthma, uncomplicated: Secondary | ICD-10-CM | POA: Diagnosis not present

## 2014-08-19 LAB — CBC
HCT: 40.9 % (ref 39.0–52.0)
Hemoglobin: 14.5 g/dL (ref 13.0–17.0)
MCH: 27.3 pg (ref 26.0–34.0)
MCHC: 35.5 g/dL (ref 30.0–36.0)
MCV: 77 fL — ABNORMAL LOW (ref 78.0–100.0)
PLATELETS: 233 10*3/uL (ref 150–400)
RBC: 5.31 MIL/uL (ref 4.22–5.81)
RDW: 14.8 % (ref 11.5–15.5)
WBC: 10.5 10*3/uL (ref 4.0–10.5)

## 2014-08-19 LAB — BASIC METABOLIC PANEL
ANION GAP: 13 (ref 5–15)
BUN: 15 mg/dL (ref 6–23)
CALCIUM: 9.2 mg/dL (ref 8.4–10.5)
CO2: 26 mEq/L (ref 19–32)
Chloride: 93 mEq/L — ABNORMAL LOW (ref 96–112)
Creatinine, Ser: 0.98 mg/dL (ref 0.50–1.35)
GFR calc Af Amer: >90 mL/min (ref >90)
GFR calc non Af Amer: >90 mL/min (ref >90)
Glucose, Bld: 109 mg/dL — ABNORMAL HIGH (ref 70–99)
Potassium: 3.9 mEq/L (ref 3.7–5.3)
SODIUM: 132 meq/L — AB (ref 137–147)

## 2014-08-19 LAB — I-STAT TROPONIN, ED: TROPONIN I, POC: 0 ng/mL (ref 0.00–0.08)

## 2014-08-19 NOTE — Telephone Encounter (Signed)
I called the patient again, left a message, I will call back tomorrow.

## 2014-08-19 NOTE — Telephone Encounter (Signed)
Patient calling to ask some questions about the tests that they did at the hospital before he was referred to [place], requesting a call back from Dr. Jannifer Franklin. Please call and advise.

## 2014-08-19 NOTE — ED Notes (Signed)
Pt. reports left lower chest "tightness" for 2 weeks with fingers tingling / dizziness. Denies SOB , no nausea or diaphoresis .

## 2014-08-19 NOTE — Telephone Encounter (Signed)
I called patient, left a message, I will call back later. 

## 2014-08-20 ENCOUNTER — Inpatient Hospital Stay: Payer: 59 | Admitting: Internal Medicine

## 2014-08-20 ENCOUNTER — Encounter: Payer: Self-pay | Admitting: Internal Medicine

## 2014-08-20 ENCOUNTER — Ambulatory Visit (INDEPENDENT_AMBULATORY_CARE_PROVIDER_SITE_OTHER): Payer: 59 | Admitting: Internal Medicine

## 2014-08-20 ENCOUNTER — Emergency Department (HOSPITAL_COMMUNITY): Payer: 59

## 2014-08-20 VITALS — BP 130/84 | HR 87 | Ht 66.0 in | Wt 209.0 lb

## 2014-08-20 DIAGNOSIS — R0600 Dyspnea, unspecified: Secondary | ICD-10-CM

## 2014-08-20 DIAGNOSIS — R599 Enlarged lymph nodes, unspecified: Secondary | ICD-10-CM

## 2014-08-20 DIAGNOSIS — R590 Localized enlarged lymph nodes: Secondary | ICD-10-CM | POA: Insufficient documentation

## 2014-08-20 LAB — CSF CULTURE

## 2014-08-20 LAB — CSF CULTURE W GRAM STAIN: Culture: NO GROWTH

## 2014-08-20 MED ORDER — PREDNISONE 10 MG PO TABS: 10.0000 mg | ORAL_TABLET | Freq: Every day | ORAL | Status: DC

## 2014-08-20 NOTE — ED Notes (Signed)
Pt reports chest tightness x 2 weeks associated with bilateral tingling in fingertips. NIH negative. Denies chest pain or increased shortness of breath. Tightness located on L side of chest/ribcage into back. Reports appointment tomorrow for bronchoscope

## 2014-08-20 NOTE — Patient Instructions (Addendum)
#  Hilar adenopathy #Shortness of breath   - do PET scan  - do full PFT breathing test  Finish the above and return to see me < 2 weeks

## 2014-08-20 NOTE — Progress Notes (Signed)
Subjective:    Patient ID: Gregory Fuller, male    DOB: [birth year], 33 y.o.   MRN: [id number]  HPI    OV 08/20/2014  Chief Complaint  Patient presents with  . HFU    Pt is c/o having chest tightness. He went to the ER last night. Pt states he has alot of mucus in his chest and throat as well.     Admit H&P by [facility] 08/04/14 Gregory Fuller is a 33 y.o. male with history of bronchial asthma started experiencing neck and back pain with lower extremity numbness after he had a fall in July of this year. Patient had gone to urgent care center as outpatient when he was given steroids following which his symptoms improved but returned back after the steroid taper. Patient had gone to [facility] and over there patient was placed for outpatient MRI which showed abnormal again patient was referred to the ER. In the ER patient was evaluated by Dr. Leonel Ramsay neurologist on-call and at this time given patient's features in the MRI spine MRI brain neurologist is concerned about myelitis and patient has been started on Solu-Medrol. Patient will be admitted for further management. Patient denies any headache visual symptoms any weakness of the lower extremities or any incontinence of bowel or urine. Denies any chest pain or shortness of breath fever chills. Denies any recent travel outside the [place] or recent vaccination.   Neuro inpatiient Consult 08/04/14  Gregory Fuller is an 33 y.o. male who fell in the tube 2 months ago. He noted he slipped and when he landed he landed on his neck. He did not lose consciousness. A few days after his fall he noted both back and neck pain. He also noted when he looked down he had a electrical shock like sen sensation that went down his back. He seeked medical attention and was given a shot of steroids which seemed to help. A few days later his pain returned and he went to urgent care where he was give a 10 mg steroid dose pack. This seemed to help the  pain, but again when he finished the dose pack his neck pain returned. Patient obtained a MRI of cervical spine today which demonstrated abnormal cord signal from C2-T8. Patient denies any blurred vision, diplopia, lack of strength, bowel or bladder incontinence, shortness of breath.   Differential diagnosis includes multiple sclerosis, neuromyelitis optica, infectious myelitis, autoimmune disease, metabolic myelopathy. The longitudinally extensive nature of this   1) NMO IgG from serum 2) autoimmune evaluation with ANA, serum ACE, SSA, SSB, ena, anca, rf 3) CSF evaluation for cell count and differential, protein, glucose, oligoclonal bands, IgG index. I would perform this after contrasted MRI brain 4) consider infectious evaluation with CSF PCR for HSV-1, HSV-2, HHV-6, VZV, CMV, EBV, enteroviruses if the patient were to spike a fever, develop meningismus, or have other signs of infectious etiology clinically or on other workup 4) HIV RNA, if immune compromised then further infectious workup 5) MRI brain with/without contrast, MRI c-spine w/contrast, MRI T-spine w/wo contrast.  6) Solumedrol 500mg  BID    Pulmonyar inpatient consult 08/05/14 33 yo AAM with known asthma(prn albuterol MDI) occasional tobacco user along with recreational marijuana use who fell in tub 2 months ago and struck his back/neck without loss of consciousness . He notes neck/back pain treated with steroids per urgent care. He improved for a time but symptoms have returned.He was evaluated by Neuro in ED and MRI  of brain and spine with concern for myelitis which was treated with steroids and he has been admitted for further evaluation. Note his MRI demonstrated Posterior pleural disease RL and right hilar adenopathy.  He is scheduled for lumbar puncture per Neurology and PCCM asked to evaluate for possible FOB with bx.  Neuro office notes 08/17/14:  Gregory Fuller is a 33 year old black male with a history of onset of numbness in  the lower extremities dating back to July 2015. The patient had a fall in the bathtub at home in July. The patient developed some back discomfort since that time that he thought was related to the fall. The patient continued to have sensory symptoms in the lower extremities, and some issues with constipation. The patient has not had any problems controlling the bladder. The patient has not had any visual complaints. When he flexes the neck down, he may have sensory alteration into the right face, and a pulling sensation down both legs. The patient was recently in the hospital for an evaluation when MRI evaluation showed extensive involvement of the spinal cord, with dural enhancement. The patient has been treated with steroids, and he in general is doing fairly well. The patient has undergone extensive blood work and spinal fluid analysis. The spinal fluid analysis shows 310 white cells, predominantly lymphocytes, and red blood cells are 5. The patient had greater than 5 oligoclonal bands in the spinal fluid. The angiotensin converting enzyme level on the blood work was normal. NMO antibodies were negative. The patient has had some mild improvement in his sensory symptoms, but he still has some discomfort in the back. The patient comes to this office for an evaluation. He reports no visual symptoms. He has not had any fevers or chills, and no confusion or altered mental status or seizures.  called patient. I have reviewed medical records last evening. The MRI of the brain and spine show enhancement of the dural membranes. 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 The patient had a positive ANA, and a P-ANCA. I'll send the patient back for further evaluation with blood work. I will check a repeat scan of the brain and spinal cord in 6 weeks. The patient will continue his prednisone taper, and he will call me if  he feels that he is getting worse over time. A repeat lumbar puncture will need to be done in the future.  The patient indicates that he has 2 dogs, no cats. He has not had any travel outside of the [place]. He denies any history of ulcers on the mouth or genitals. He has had some left hip discomfort.  On future spinal fluid analysis, evaluation for TB, mycoplasma, listeria, and possibly for an amoebic infection needs to be considered.            OV 08/20/14" to pulmnary   - He is following for above with right hilar adenpahy. Overall well except the mild discomfort in back and mild ydspnea on on exertion; class 1-2 and the discomfort in his left precordium. He is doing his ADLs. He is frustrated by lack of diagnosis so far.      has a past medical history of Asthma and Subacute transverse myelitis.   . has past surgical history that includes No past surgeries.   reports that he has never smoked. He has never used smokeless tobacco.  Allergies  Allergen Reactions  . Vicodin [  Hydrocodone-Acetaminophen] Other (See Comments)    Made patient feel like he was in more pain    Immunization History  Administered Date(s) Administered  . Influenza,inj,Quad PF,36+ Mos 08/09/2014     Review of Systems  Constitutional: Negative for fever and unexpected weight change.  HENT: Negative for congestion, dental problem, ear pain, nosebleeds, postnasal drip, rhinorrhea, sinus pressure, sneezing, sore throat and trouble swallowing.   Eyes: Negative for redness and itching.  Respiratory: Positive for cough and chest tightness. Negative for shortness of breath and wheezing.   Cardiovascular: Negative for palpitations and leg swelling.  Gastrointestinal: Negative for nausea and vomiting.  Genitourinary: Negative for dysuria.  Musculoskeletal: Negative for joint swelling.  Skin: Negative for rash.  Neurological: Negative for headaches.  Hematological: Does not bruise/bleed easily.    Psychiatric/Behavioral: Negative for dysphoric mood. The patient is not nervous/anxious.        Objective:   Physical Exam  Nursing note and vitals reviewed. Constitutional: He is oriented to person, place, and time. He appears well-developed and well-nourished. No distress.  Body mass index is 33.75 kg/(m^2).   HENT:  Head: Normocephalic and atraumatic.  Right Ear: External ear normal.  Left Ear: External ear normal.  Mouth/Throat: Oropharynx is clear and moist. No oropharyngeal exudate.  Eyes: Conjunctivae and EOM are normal. Pupils are equal, round, and reactive to light. Right eye exhibits no discharge. Left eye exhibits no discharge. No scleral icterus.  Neck: Normal range of motion. Neck supple. No JVD present. No tracheal deviation present. No thyromegaly present.  Cardiovascular: Normal rate, regular rhythm and intact distal pulses.  Exam reveals no gallop and no friction rub.   No murmur heard. Pulmonary/Chest: Effort normal and breath sounds normal. No respiratory distress. He has no wheezes. He has no rales. He exhibits no tenderness.  Abdominal: Soft. Bowel sounds are normal. He exhibits no distension and no mass. There is no tenderness. There is no rebound and no guarding.  Musculoskeletal: Normal range of motion. He exhibits no edema and no tenderness.  Lymphadenopathy:    He has no cervical adenopathy.  Neurological: He is alert and oriented to person, place, and time. He has normal reflexes. No cranial nerve deficit. Coordination normal.  Skin: Skin is warm and dry. No rash noted. He is not diaphoretic. No erythema. No pallor.  Psychiatric: He has a normal mood and affect. His behavior is normal. Judgment and thought content normal.    Filed Vitals:   08/20/14 1032  BP: 130/84  Pulse: 87  Height: 5\' 6"  (1.676 m)  Weight: 209 lb (94.802 kg)  SpO2: 98%         Assessment & Plan:  #Hilar adenopathy #Shortness of breath   - do PET scan  - do full PFT  breathing test  Finish the above and return to see me < 2 weeks   Dr. Brand Males, M.D., [facility].C.P Pulmonary and Critical Care Medicine Staff Physician [facility] Pager: [phone], If no answer or between  15:00h - 7:00h: call [phone]  09/05/2014 10:27 PM

## 2014-08-20 NOTE — ED Provider Notes (Signed)
Medical screening examination/treatment/procedure(s) were performed by non-physician practitioner and as supervising physician I was immediately available for consultation/collaboration.   EKG Interpretation None      Rolland Porter, MD, [facility]   Janice Norrie, MD 08/20/14 [phone]

## 2014-08-20 NOTE — Discharge Instructions (Signed)
Please follow up with Dr. Jannifer Franklin' office today to schedule your followup appointment. Please continue with the prednisone taper prescribed to you. Please be sure you keep your bronchoscopy appointment for further evaluation of your pulmonary nodule. Please read all discharge instructions are [place] cautions.    Chest Pain (Nonspecific) It is often hard to give a specific diagnosis for the cause of chest pain. There is always a chance that your pain could be related to something serious, such as a heart attack or a blood clot in the lungs. You need to follow up with your health care provider for further evaluation. CAUSES   Heartburn.  Pneumonia or bronchitis.  Anxiety or stress.  Inflammation around your heart (pericarditis) or lung (pleuritis or pleurisy).  A blood clot in the lung.  A collapsed lung (pneumothorax). It can develop suddenly on its own (spontaneous pneumothorax) or from trauma to the chest.  Shingles infection (herpes zoster virus). The chest wall is composed of bones, muscles, and cartilage. Any of these can be the source of the pain.  The bones can be bruised by injury.  The muscles or cartilage can be strained by coughing or overwork.  The cartilage can be affected by inflammation and become sore (costochondritis). DIAGNOSIS  Lab tests or other studies may be needed to find the cause of your pain. Your health care provider may have you take a test called an ambulatory electrocardiogram (ECG). An ECG records your heartbeat patterns over a 24-hour period. You may also have other tests, such as:  Transthoracic echocardiogram (TTE). During echocardiography, sound waves are used to evaluate how blood flows through your heart.  Transesophageal echocardiogram (TEE).  Cardiac monitoring. This allows your health care provider to monitor your heart rate and rhythm in real time.  Holter monitor. This is a portable device that records your heartbeat and can help diagnose  heart arrhythmias. It allows your health care provider to track your heart activity for several days, if needed.  Stress tests by exercise or by giving medicine that makes the heart beat faster. TREATMENT   Treatment depends on what may be causing your chest pain. Treatment may include:  Acid blockers for heartburn.  Anti-inflammatory medicine.  Pain medicine for inflammatory conditions.  Antibiotics if an infection is present.  You may be advised to change lifestyle habits. This includes stopping smoking and avoiding alcohol, caffeine, and chocolate.  You may be advised to keep your head raised (elevated) when sleeping. This reduces the chance of acid going backward from your stomach into your esophagus. Most of the time, nonspecific chest pain will improve within 2-3 days with rest and mild pain medicine.  HOME CARE INSTRUCTIONS   If antibiotics were prescribed, take them as directed. Finish them even if you start to feel better.  For the next few days, avoid physical activities that bring on chest pain. Continue physical activities as directed.  Do not use any tobacco products, including cigarettes, chewing tobacco, or electronic cigarettes.  Avoid drinking alcohol.  Only take medicine as directed by your health care provider.  Follow your health care provider's suggestions for further testing if your chest pain does not go away.  Keep any follow-up appointments you made. If you do not go to an appointment, you could develop lasting (chronic) problems with pain. If there is any problem keeping an appointment, call to reschedule. SEEK MEDICAL CARE IF:   Your chest pain does not go away, even after treatment.  You have a rash with  blisters on your chest.  You have a fever. SEEK IMMEDIATE MEDICAL CARE IF:   You have increased chest pain or pain that spreads to your arm, neck, jaw, back, or abdomen.  You have shortness of breath.  You have an increasing cough, or you  cough up blood.  You have severe back or abdominal pain.  You feel nauseous or vomit.  You have severe weakness.  You faint.  You have chills. This is an emergency. Do not wait to see if the pain will go away. Get medical help at once. Call your local emergency services (911 in [place] Do not drive yourself to the hospital. MAKE SURE YOU:   Understand these instructions.  Will watch your condition.  Will get help right away if you are not doing well or get worse. Document Released: 08/01/2005 Document Revised: 10/27/2013 Document Reviewed: 05/27/2008 [facility] Patient Information 2015 [place], [place]. This information is not intended to replace advice given to you by your health care provider. Make sure you discuss any questions you have with your health care provider. Transverse Myelitis Transverse myelitis is a disorder of the spinal cord. Inflammation in the spinal cord can hurt the fatty substance (myelin) that covers the nerve fibers. This would be similar to losing the insulation around electrical wires. This damage causes problems with the nerve cells (neurons) in the spinal cord that carry signals from the brain to the rest of the body. The part of the spinal cord affected determines which parts of the body are affected. An injury to the spinal cord at the level of the chest can cause problems with leg movement and bowel and bladder control. An injury to the spinal cord at the level of the neck can cause problems with sensation and motor function of the arms, legs, and bowel and bladder control. If an injury is high enough in the neck, it may even affect breathing. Some patients recover from this disorder with minor or no lasting problems. Others may have lasting problems that make daily living difficult. Most patients will have only 1 episode of transverse myelitis. A small percentage may have problems return. CAUSES  The exact cause is not known. Some of the things thought to cause  this problem include:  Viral infections, such as herpes, chickenpox (varicella zoster), cytomegalovirus, and Epstein-Barr virus.  Abnormal immune reactions.  Poor blood flow to the spinal cord. This disorder may also occur as a complication of:  Syphilis.  Measles.  Lyme disease.  Chickenpox, if severe.  Some vaccinations, including those for chickenpox and rabies. SYMPTOMS  Symptoms can come on over several hours to weeks. Transverse myelitis may cause a loss of spinal cord function over several hours to several weeks. Some symptoms are:  Headaches.  Fever.  Loss of appetite.  A sudden onset of lower back pain.  Abnormal sensations in the toes and feet.  Weakness in the arms and legs.  Bowel and urinary problems. DIAGNOSIS   Medical history and neurological exam.  Magnetic resonance imaging (MRI). This procedure provides a picture of the brain and spinal cord.  Myelography. This procedure involves injecting a dye and taking X-rays.  Blood tests may be performed.  A spinal tap may be done to examine the spinal fluid. TREATMENT  No cure exists for this illness. Treatments are designed to help decrease the symptoms.  Corticosteroid treatment is often used during the first few weeks of illness. This decreases inflammation.  Pain medicines are prescribed as needed.  Sometimes,  respirators are needed if there is severe difficulty breathing.  Physical therapy may be used to keep muscles flexible and strong.  Movement decreases the chances of pressure sores developing.  Later, if patients begin to recover limb control, physical therapy helps begin to improve muscle strength, coordination, and range of motion. HOME CARE INSTRUCTIONS  Much can be done for people with permanent disabilities.  Treatment programs and other resources are likely available in your community. Medical social workers can help you locate this information.  Rehabilitative therapy will  teach you how to care for yourself. Rehabilitation cannot reverse the physical damage resulting from this illness. However, it can help people, even those with severe paralysis, become as independent as possible. This helps you attain the best possible quality of life.  Medicines can be prescribed by your caregiver. These medicines can help with depression and adapting to a changed way of life. A wide variety of drugs now exist that can help with the pain from spinal cord injuries.  Increasing your strength and endurance is possible. Treatment improves coordination and reduces muscle spasms (spasticity) and muscle wasting in paralyzed limbs. Physical therapists can help you regain greater control over bladder and bowel function through various exercises. They can also teach paralyzed patients techniques for using assistive devices (wheelchairs, canes, braces) as effectively as possible.  Learning new ways of performing everyday tasks is important. These tasks include bathing, dressing, preparing a meal, house cleaning, engaging in arts and crafts, or gardening. Occupational therapists can help you with this. [place] of Neurological Disorders and Stroke: MasterBoxes.it Transverse Myelitis Association: www.myelitis.org American Chronic Pain Association: www.theacpa.[place] to Cure Paralysis: www.themiamiproject.[place]: ForexFest.com.pt Document Released: 10/12/2002 Document Revised: 01/14/2012 Document Reviewed: 01/18/2010 [facility] Patient Information 2015 [place], [place]. This information is not intended to replace advice given to you by your health care provider. Make sure you discuss any questions you have with your health care provider.

## 2014-08-20 NOTE — Telephone Encounter (Signed)
I called patient. I reviewed the lab studies with him. The patient was in the emergency room yesterday with some chest pain, this could be related to the spinal cord lesions. The patient will be seen a pulmonary doctor for further evaluation. He does have some hilar adenopathy on chest x-ray. I believe that sarcoidosis is still in the differential even know the angiotensin-converting enzyme level was negative. Sarcoidosis can result in oligoclonal banding, and meningeal irritation.  The patient will be coming in for a visual evoked response test. Sarcoidosis can affect the optic nerves. Further blood work will be done. In the future, we may consider an infectious disease consultation to look for other infectious causes of meningoencephalitis.

## 2014-08-20 NOTE — ED Provider Notes (Signed)
CSN: [id number]     Arrival date & time 08/19/14  2300 History   First MD Initiated Contact with Patient 08/19/14 2352     Chief Complaint  Patient presents with  . Chest Pain     (Consider location/radiation/quality/duration/timing/severity/associated sxs/prior Treatment) HPI Comments: Patient is a 33 yo M PMHx significant for asthma, subacute transverse myelitis presenting to the ED for continued left anterolateral chest tightness since being hospitalized for subacute transverse myelitis. Patient states he does not feel that his symptoms are improving despite the medications Dr. Jannifer Franklin has him on. Denies any changes in his left-sided chest pain prior to admission to the hospital. He also denies any changes to his symptoms in his lower extremities and upper extremities prior to admission. He still continues to deny any shortness of he recently just finished his 5 day course of 40 mg of prednisone, states Dr. Jannifer Franklin is called in the remainder of his prednisone taper. Patient states he is awaiting a call to schedule bronchoscopy for a pulmonary nodule. No early familial cardiac history. No unilateral leg swelling. PERC negative  Patient is a 33 y.o. male presenting with chest pain.  Chest Pain Associated symptoms: no shortness of breath     Past Medical History  Diagnosis Date  . Asthma   . Subacute transverse myelitis    Past Surgical History  Procedure Laterality Date  . No past surgeries     Family History  Problem Relation Age of Onset  . Diabetes Other   . Thyroid disease Mother   . Seizures Father    History  Substance Use Topics  . Smoking status: Never Smoker   . Smokeless tobacco: Never Used  . Alcohol Use: Yes     Comment: occassional weekends    Review of Systems  Respiratory: Negative for shortness of breath.   Cardiovascular: Positive for chest pain. Negative for leg swelling.  All other systems reviewed and are negative.     Allergies  Vicodin  Home  Medications   Prior to Admission medications   Medication Sig Start Date End Date Taking? Authorizing Provider  acetaminophen (TYLENOL) 500 MG tablet Take 1,500 mg by mouth every 6 (six) hours as needed for mild pain.   Yes Historical Provider, MD  albuterol (PROVENTIL HFA;VENTOLIN HFA) 108 (90 BASE) MCG/ACT inhaler Inhale 2 puffs into the lungs every 6 (six) hours as needed for wheezing or shortness of breath. For asthma   Yes Historical Provider, MD  gabapentin (NEURONTIN) 300 MG capsule Take 1 capsule (300 mg total) by mouth 2 (two) times daily. 08/17/14  Yes Kathrynn Ducking, MD  Multiple Vitamin (ONE-A-DAY MENS PO) Take 1 tablet by mouth daily.   Yes Historical Provider, MD  pantoprazole (PROTONIX) 40 MG tablet Take 1 tablet (40 mg total) by mouth daily. 08/09/14  Yes Reyne Dumas, MD  predniSONE (DELTASONE) 10 MG tablet prednisone 60 mg daily x 5 days, then 40mg  x 5 days, then further tapering by outpatient neurologist. 08/09/14  Yes Reyne Dumas, MD  predniSONE (DELTASONE) 10 MG tablet Take 1 tablet (10 mg total) by mouth daily. Take 3 tablets daily by mouth for 5 days. Take 2 tablets daily by mouth for 5 days. Take one tablet daily by mouth for 5 days. 08/20/14   Geneveive Furness L Kylle Lall, PA-C   BP 144/92  Pulse 98  Temp(Src) 98.4 F (36.9 C) (Oral)  Resp 16  Ht 5\' 6"  (1.676 m)  Wt 204 lb (92.534 kg)  BMI 32.94 kg/m2  SpO2 100% Physical Exam  Nursing note and vitals reviewed. Constitutional: He is oriented to person, place, and time. He appears well-developed and well-nourished. No distress.  HENT:  Head: Normocephalic and atraumatic.  Right Ear: External ear normal.  Left Ear: External ear normal.  Nose: Nose normal.  Mouth/Throat: Oropharynx is clear and moist. No oropharyngeal exudate.  Eyes: Conjunctivae and EOM are normal. Pupils are equal, round, and reactive to light.  Neck: Normal range of motion. Neck supple.  Cardiovascular: Normal rate, regular rhythm, normal heart  sounds and intact distal pulses.   Pulmonary/Chest: Effort normal and breath sounds normal. No respiratory distress. He exhibits no tenderness.  Abdominal: Soft. There is no tenderness.  Musculoskeletal: Normal range of motion. He exhibits no edema.  Neurological: He is alert and oriented to person, place, and time. He has normal strength. No cranial nerve deficit. Gait normal. GCS eye subscore is 4. GCS verbal subscore is 5. GCS motor subscore is 6.  Sensation mildly decreased in the distal portion of extremities, unchanged from previous neurologic examinations. No pronator drift.  Bilateral heel-knee-shin intact.  Skin: Skin is warm and dry. No rash noted. He is not diaphoretic.    ED Course  Procedures (including critical care time) Medications - No data to display  Labs Review Labs Reviewed  CBC - Abnormal; Notable for the following:    MCV 77.0 (*)    All other components within normal limits  BASIC METABOLIC PANEL - Abnormal; Notable for the following:    Sodium 132 (*)    Chloride 93 (*)    Glucose, Bld 109 (*)    All other components within normal limits  I-STAT TROPOININ, ED    Imaging Review Dg Chest 2 View  08/20/2014   CLINICAL DATA:  Left-sided chest pain.  Initial encounter.  EXAM: CHEST  2 VIEW  COMPARISON:  08/04/2014  FINDINGS: No cardiomegaly. No change in mediastinal contours in this patient with known hilar adenopathy. No acute infiltrate or edema. No effusion or pneumothorax. No acute osseous findings.  IMPRESSION: Stable exam.  No evidence of acute cardiopulmonary disease.   Electronically Signed   By: Jorje Guild M.D.   On: 08/20/2014 00:38     EKG Interpretation None      12:48 AM Discussed patient with Dr. Janann Colonel of neurology - continue steroid taper and follow up with Dr. Jannifer Franklin.   MDM   Final diagnoses:  Chest tightness  Subacute transverse myelitis    Filed Vitals:   08/20/14 0201  BP: 144/92  Pulse: 98  Temp:   Resp: 16    Afebrile, NAD, non-toxic appearing, AAOx4. Patient is to be discharged with recommendation to follow up with his neurologist in regards to today's hospital visit. Chest pain is not likely of cardiac or pulmonary etiology d/t presentation, perc negative, VSS, no tracheal deviation, no JVD or new murmur, RRR, breath sounds equal bilaterally, EKG without acute abnormalities, negative troponin, and negative CXR. Symptoms are continued from left sided chest tightness from prior to his hospitalization. Neuro hospital as was consulted and advised patient continue prednisone taper and follow up with Dr. Jannifer Franklin as an outpatient. Patient is agreeable to the plan. Patient is stable at time of discharge Patient d/w with Dr. Tomi Bamberger, agrees with plan.    Harlow Mares, PA-C 08/20/14 [phone]

## 2014-08-23 ENCOUNTER — Ambulatory Visit (INDEPENDENT_AMBULATORY_CARE_PROVIDER_SITE_OTHER): Payer: 59

## 2014-08-23 ENCOUNTER — Other Ambulatory Visit (INDEPENDENT_AMBULATORY_CARE_PROVIDER_SITE_OTHER): Payer: Self-pay

## 2014-08-23 ENCOUNTER — Telehealth: Payer: Self-pay | Admitting: Neurology

## 2014-08-23 DIAGNOSIS — G0489 Other myelitis: Secondary | ICD-10-CM

## 2014-08-23 DIAGNOSIS — G373 Acute transverse myelitis in demyelinating disease of central nervous system: Secondary | ICD-10-CM

## 2014-08-23 DIAGNOSIS — Z0289 Encounter for other administrative examinations: Secondary | ICD-10-CM

## 2014-08-23 DIAGNOSIS — G0490 Encephalitis and encephalomyelitis, unspecified: Secondary | ICD-10-CM

## 2014-08-23 LAB — MISCELLANEOUS TEST

## 2014-08-23 NOTE — Procedures (Signed)
    History:   Gregory Fuller is a 33 year old gentleman with a history of meningoencephalitis who is being evaluated for optic nerve dysfunction. The patient has had symptoms of sensory alteration in gait disturbance for approximately 3 months. MRI the brain and cervical and thoracic spines are abnormal.  Description: The visual evoked response test was performed today using 32 x 32 check sizes. The absolute latencies for the N1 and the P100 wave forms were within normal limits bilaterally. The amplitudes for the P100 wave forms were also within normal limits bilaterally. The visual acuity was 20/30 OD and 20/30 OS corrected.  Impression:  The visual evoked response test above was within normal limits bilaterally. No evidence of conduction slowing was seen within the anterior visual pathways on either side on today's evaluation.

## 2014-08-23 NOTE — Telephone Encounter (Signed)
I called the patient. The VER is normal.  

## 2014-08-23 NOTE — Telephone Encounter (Signed)
Patient returning call to Dr. Jannifer Franklin, please call patient back.

## 2014-08-23 NOTE — Telephone Encounter (Signed)
I called the patient. The VER is normal. He has a rash, but it looks like acne. I think it is the prednisone. He is to watch this. He had his blood work done.

## 2014-08-25 LAB — MYCOPLASMA PNEUMONIAE ANTIBODY, IGG: Mycoplasma pneumo IgG: 115 U/mL — ABNORMAL HIGH (ref 0–99)

## 2014-08-26 ENCOUNTER — Telehealth: Payer: Self-pay | Admitting: Neurology

## 2014-08-26 NOTE — Telephone Encounter (Signed)
Patient checking status of referral to Dr. Ancil Linsey, fax # [phone] attention [place].  Please call and advise.

## 2014-08-27 ENCOUNTER — Ambulatory Visit (HOSPITAL_COMMUNITY)
Admission: RE | Admit: 2014-08-27 | Discharge: 2014-08-27 | Disposition: A | Discharge status: Home / Self Care | Payer: 59 | Source: Ambulatory Visit | Attending: Internal Medicine | Admitting: Internal Medicine

## 2014-08-27 DIAGNOSIS — R590 Localized enlarged lymph nodes: Secondary | ICD-10-CM

## 2014-08-27 DIAGNOSIS — R0600 Dyspnea, unspecified: Secondary | ICD-10-CM

## 2014-08-27 DIAGNOSIS — R599 Enlarged lymph nodes, unspecified: Secondary | ICD-10-CM | POA: Diagnosis present

## 2014-08-27 LAB — PULMONARY FUNCTION TEST
DL/VA % PRED: 124 %
DL/VA: 5.51 ml/min/mmHg/L
DLCO COR: 28.16 ml/min/mmHg
DLCO cor % pred: 104 %
DLCO unc % pred: 104 %
DLCO unc: 28.08 ml/min/mmHg
FEF 25-75 POST: 6.12 L/s
FEF 25-75 PRE: 4.38 L/s
FEF2575-%CHANGE-POST: 39 %
FEF2575-%PRED-POST: 164 %
FEF2575-%PRED-PRE: 117 %
FEV1-%Change-Post: 6 %
FEV1-%Pred-Post: 108 %
FEV1-%Pred-Pre: 102 %
FEV1-PRE: 3.38 L
FEV1-Post: 3.59 L
FEV1FVC-%Change-Post: 1 %
FEV1FVC-%PRED-PRE: 107 %
FEV6-%CHANGE-POST: 4 %
FEV6-%PRED-POST: 102 %
FEV6-%Pred-Pre: 97 %
FEV6-POST: 3.97 L
FEV6-Pre: 3.79 L
FEV6FVC-%Pred-Post: 102 %
FEV6FVC-%Pred-Pre: 102 %
FVC-%CHANGE-POST: 4 %
FVC-%PRED-PRE: 96 %
FVC-%Pred-Post: 100 %
FVC-Post: 3.97 L
FVC-Pre: 3.79 L
PRE FEV1/FVC RATIO: 89 %
Post FEV1/FVC ratio: 90 %
Post FEV6/FVC ratio: 100 %
Pre FEV6/FVC Ratio: 100 %
RV % pred: 128 %
RV: 1.87 L
TLC % pred: 92 %
TLC: 5.67 L

## 2014-08-27 LAB — GLUCOSE, CAPILLARY: GLUCOSE-CAPILLARY: 104 mg/dL — AB (ref 70–99)

## 2014-08-27 MED ORDER — ALBUTEROL SULFATE (2.5 MG/3ML) 0.083% IN NEBU
2.5000 mg | INHALATION_SOLUTION | Freq: Once | RESPIRATORY_TRACT | Status: AC
  Administered 2014-08-27: 2.5 mg via RESPIRATORY_TRACT

## 2014-08-27 MED ORDER — FLUDEOXYGLUCOSE F - 18 (FDG) INJECTION
11.1000 | Freq: Once | INTRAVENOUS | Status: AC | PRN
  Administered 2014-08-27: 11.1 via INTRAVENOUS

## 2014-08-27 NOTE — Telephone Encounter (Signed)
Left message that referral was sent to Dr. Cathren Laine on [phone].

## 2014-08-30 ENCOUNTER — Telehealth: Payer: Self-pay | Admitting: Neurology

## 2014-08-30 DIAGNOSIS — G0490 Encephalitis and encephalomyelitis, unspecified: Secondary | ICD-10-CM

## 2014-08-30 LAB — PAN-ANCA
ANCA Proteinase 3: <3.5 U/mL (ref 0.0–3.5)
Atypical pANCA: <1:20 {titer}
C-ANCA: <1:20 {titer}

## 2014-08-30 LAB — HEPATITIS C ANTIBODY

## 2014-08-30 LAB — SEDIMENTATION RATE: Sed Rate: 4 mm/hr (ref 0–15)

## 2014-08-30 LAB — CRYOGLOBULIN, QL, SERUM, RFLX

## 2014-08-30 LAB — ANA W/REFLEX: Anti Nuclear Antibody(ANA): NEGATIVE

## 2014-08-30 LAB — TOXOPLASMA GONDII ANTIBODY, IGG

## 2014-08-30 LAB — MYCOPLASMA PNEUMONIAE ANTIBODY, IGM: Mycoplasma pneumo IgM: <770 U/mL (ref 0–769)

## 2014-08-30 NOTE — Telephone Encounter (Signed)
Patient requesting referral to Dr. Lily Kocher, phone # [phone] and fax # [phone].  Please call and advise.

## 2014-08-31 NOTE — Telephone Encounter (Signed)
Left message for patient to call and let us know if this is a different referral for a different reason.  A referral has already been made to Dr. Marlynn Perking.

## 2014-09-01 ENCOUNTER — Encounter: Payer: Self-pay | Admitting: Internal Medicine

## 2014-09-01 ENCOUNTER — Ambulatory Visit (INDEPENDENT_AMBULATORY_CARE_PROVIDER_SITE_OTHER): Payer: 59 | Admitting: Internal Medicine

## 2014-09-01 VITALS — BP 152/86 | HR 88 | Ht 66.0 in | Wt 212.0 lb

## 2014-09-01 DIAGNOSIS — R590 Localized enlarged lymph nodes: Secondary | ICD-10-CM

## 2014-09-01 DIAGNOSIS — R599 Enlarged lymph nodes, unspecified: Secondary | ICD-10-CM

## 2014-09-01 DIAGNOSIS — R0600 Dyspnea, unspecified: Secondary | ICD-10-CM

## 2014-09-01 NOTE — Telephone Encounter (Signed)
Patient returning call to Butch Penny, please return call and advise.

## 2014-09-01 NOTE — Patient Instructions (Addendum)
#  Hilar adenopathy #Shortness of breath   - the breathing test is normal - the left chest sensation could be due to issues in the spinal column  - PET scan shows lymph gland is active and for this scheduled for EBUS 09/27/14 7am or so  morning at [place],  I will try to get an earlier date or another procedure called mediastinascopy  Please call in 48h if you do not hear fropm [place]   ... Update procedure date advanced to 09/08/14 Wednesday at [facility]

## 2014-09-01 NOTE — Progress Notes (Signed)
Subjective:    Patient ID: Gregory Fuller, male    DOB: 1981-07-05, 33 y.o.   MRN: [id number]  HPI    OV 09/01/2014  Chief Complaint  Patient presents with  . Follow-up    Pt states he feels he is doing well. Pt c/o prod cough with yellow mucus and left sided chest tightness under left breast that radiates to left mid back.  Pt denies SOB.    Follow-up mediastinal adenopathy  - He continues to have upper thoracic back pain along with left-sided retrocardiac dull feeling. Otherwise asymptomatic. He is here to review his PET scan which shows a small subcarinal PET heart adenopathy and also station 10 left-sided adenopathy that is PET hot. Both can be reached with endobronchial ultrasound transbronchial needle aspiration biopsy. He also has intense diffuse uptake in his cervical and thoracic spine. Top differential diagnosis per radiology is sarcoidosis.  - I spoke to his mom on the telephone and she is extremely frustrated that over several weeks specific diagnosis has been established and the possibilities and differential diagnosis keep changing. She is wondering about a referral to [facility] or [facility] but for now she is ok with procedureal workup here  - Of note, he is on chronic steroids since his admission 08/04/14 to hospital  - The following PET scan was reviewed with the patient  EXAM:  NUCLEAR MEDICINE PET SKULL BASE TO THIGH  TECHNIQUE:  11.1 mCi F-18 FDG was injected intravenously. Full-ring PET imaging  was performed from the skull base to thigh after the radiotracer. CT  data was obtained and used for attenuation correction and anatomic  localization.  FASTING BLOOD GLUCOSE: Value: 104 mg/dl  COMPARISON: CT 08/05/2014  FINDINGS:  NECK  No hypermetabolic lymph nodes in the neck.  CHEST  Cluster of right infrahilar lymph nodes described on comparison CT  are hypermetabolic with SUV max equal 14.6. The lymph node volumes  are difficult to measure on this  noncontrast CT. There are  approximately 3 hypermetabolic lymph nodes. Additional small  hypermetabolic subcarinal lymph node also noted along the right  bronchus intermedius on image 70.  Review of the lung parenchyma demonstrates a nodule in the posterior  medial right lower lobe against the pleural surface measuring 8 mm  on image 46, series 6. This nodule is mildly hypermetabolic with SUV  max of 3.2  There is long segments of abnormal hypermetabolic activity  associated with the spinal cord of the cervical spine and thoracic  spine down to the T12 level.  ABDOMEN/PELVIS  No abnormal hypermetabolic activity within the liver, pancreas,  adrenal glands, or spleen. No hypermetabolic lymph nodes in the  abdomen or pelvis.  SKELETON  No focal hypermetabolic activity to suggest skeletal metastasis.  IMPRESSION:  1. Cluster of hypermetabolic right infrahilar lymph nodes. This  activity is greater than reactive adenopathy. Consider granulomatous  disease versus malignancy including lymphoma. Atypical pattern for  both.  2. Small mildly metabolic nodule along the pleural surface of the  right lower lobe favors a small site of infection or inflammation.  3. Long segments of abnormal metabolic activity involving the spinal  cord previous of cervical spine and thoracic spine. This corresponds  to the abnormal dural enhancement on comparison MRI . Presumably  this relates to the hilar adenopathy which would favor granulomas  disease or lymphoma rather than a primary CNS process).  Electronically Signed  By: Suzy Bouchard M.D.  On: 08/27/2014 11:11   Review of  Systems  Constitutional: Negative for fever and unexpected weight change.  HENT: Negative for congestion, dental problem, ear pain, nosebleeds, postnasal drip, rhinorrhea, sinus pressure, sneezing, sore throat and trouble swallowing.   Eyes: Negative for redness and itching.  Respiratory: Positive for cough, chest tightness and  shortness of breath. Negative for wheezing.   Cardiovascular: Negative for palpitations and leg swelling.  Gastrointestinal: Negative for nausea and vomiting.  Genitourinary: Negative for dysuria.  Musculoskeletal: Negative for joint swelling.  Skin: Negative for rash.  Neurological: Negative for headaches.  Hematological: Does not bruise/bleed easily.  Psychiatric/Behavioral: Negative for dysphoric mood. The patient is not nervous/anxious.        Objective:   Physical Exam  Nursing note and vitals reviewed. Constitutional: He is oriented to person, place, and time. He appears well-developed and well-nourished. No distress.  cushingoid  HENT:  Head: Normocephalic and atraumatic.  Right Ear: External ear normal.  Left Ear: External ear normal.  Mouth/Throat: Oropharynx is clear and moist. No oropharyngeal exudate.  Eyes: Conjunctivae and EOM are normal. Pupils are equal, round, and reactive to light. Right eye exhibits no discharge. Left eye exhibits no discharge. No scleral icterus.  Neck: Normal range of motion. Neck supple. No JVD present. No tracheal deviation present. No thyromegaly present.  Cardiovascular: Normal rate, regular rhythm and intact distal pulses.  Exam reveals no gallop and no friction rub.   No murmur heard. Pulmonary/Chest: Effort normal and breath sounds normal. No respiratory distress. He has no wheezes. He has no rales. He exhibits no tenderness.  Abdominal: Soft. Bowel sounds are normal. He exhibits no distension and no mass. There is no tenderness. There is no rebound and no guarding.  Musculoskeletal: Normal range of motion. He exhibits no edema and no tenderness.  Lymphadenopathy:    He has no cervical adenopathy.  Neurological: He is alert and oriented to person, place, and time. He has normal reflexes. No cranial nerve deficit. Coordination normal.  Skin: Skin is warm and dry. No rash noted. He is not diaphoretic. No erythema. No pallor.  Psychiatric: He  has a normal mood and affect. His behavior is normal. Judgment and thought content normal.     Filed Vitals:   09/01/14 0933  BP: 152/86  Pulse: 88  Height: 5\' 6"  (1.676 m)  Weight: 212 lb (96.163 kg)  SpO2: 99%        Assessment & Plan:  #Hilar Adenopathy and dyspnea   - Given age 44, AA ethnicity, most likey ddx for cause of this is SARCOID. Per [facility] - MRI and PET scan findings on C Spine could fit in with sarcoid. . Currently neither hilar node nor spinal issues has a definite diagnosis. Certainly compated to spine, hilar node more easily accessible for diagnosis. He agrees with diagnostic procedure workup.   - We discussed mediastinascopy as gold standard but given his ethniticyt he will run risk for keloid. Therefore, we weill start with EBUS. He understands that there is risk for non-diagnosis with this procedure in which case we can refer him to mediastinascopy.   - the above plan including Risks of pneumothorax, hemothorax, sedation/anesthesia complications such as cardiac or respiratory arrest or hypotension, stroke and bleeding all explained. Benefits of diagnosis but limitations of non-diagnosis also explained. Patient verbalized understanding and wished to proceed.   - His mom aware as well  - Procedure planned for 09/08/14 Wednesday at [facility]   > 50% of this > 25 min visit spent in face to face  counseling (15 min visit converted to 25 min)    Dr. Brand Males, M.D., [facility].C.P Pulmonary and Critical Care Medicine Staff Physician [place] [facility] Pager: [phone], If no answer or between  15:00h - 7:00h: call [phone]  09/05/2014 10:40 PM

## 2014-09-02 ENCOUNTER — Telehealth: Payer: Self-pay | Admitting: Internal Medicine

## 2014-09-02 NOTE — Telephone Encounter (Signed)
I will make the referral.

## 2014-09-02 NOTE — Telephone Encounter (Signed)
He also needs EBUS and at [facility] cannot for a month. Please see for Gregory Fuller through [facility] if it can be done on wed 09/18/14 in morning at [facility]. This means I will have 2 cases back to back. I have a flight out that morning at 5.21am but can postpoine if these 2 patients get EBUS boooked in OR  Dr. Brand Males, M.D., [facility].C.P Pulmonary and Critical Care Medicine Staff Physician [place] [facility] Pager: [phone], If no answer or between  15:00h - 7:00h: call [phone]  09/02/2014 6:39 AM

## 2014-09-02 NOTE — Telephone Encounter (Signed)
[email or web address]  09/08/14@10 :30AM Joellen Jersey

## 2014-09-02 NOTE — Telephone Encounter (Signed)
Will forward to MR.  

## 2014-09-02 NOTE — Telephone Encounter (Signed)
Spoke to patient and when he spoke to Dr. Jaynee Eagles office they relayed if he doesn't need surgery, they won't see him.  So he has found another specialist at [facility], Dr. Lily Kocher and is asking the referral to go to that office.  Their number is [phone] and fax is [phone].

## 2014-09-02 NOTE — Telephone Encounter (Signed)
Will forward to [place] per her request.

## 2014-09-03 ENCOUNTER — Encounter (HOSPITAL_COMMUNITY): Payer: Self-pay | Admitting: Pharmacy Technician

## 2014-09-03 NOTE — Telephone Encounter (Signed)
Spoke to patient Gregory Fuller at 7:08 AM 09/03/2014  and explained EBUS and need for TBNA - avoid keloid with med  Risks of pneumothorax, hemothorax, sedation/anesthesia complications such as cardiac or respiratory arrest or hypotension, stroke and bleeding all explained. Benefits of diagnosis but limitations of non-diagnosis also explained. Patient verbalized understanding and wished to proceed.   Dr. Brand Males, M.D., [facility].C.P Pulmonary and Critical Care Medicine Staff Physician [place] [facility] Pager: [phone], If no answer or between  15:00h - 7:00h: call [phone]  09/03/2014 7:09 AM

## 2014-09-03 NOTE — Progress Notes (Signed)
Quick Note:  Pt aware of results and scheduled EBUS during OV. Nothing further needed at this time. ______

## 2014-09-07 ENCOUNTER — Encounter (HOSPITAL_COMMUNITY): Payer: Self-pay

## 2014-09-07 ENCOUNTER — Telehealth: Payer: Self-pay | Admitting: Internal Medicine

## 2014-09-07 ENCOUNTER — Encounter (HOSPITAL_COMMUNITY)
Admission: RE | Admit: 2014-09-07 | Discharge: 2014-09-07 | Disposition: A | Discharge status: Home / Self Care | Payer: 59 | Source: Ambulatory Visit | Attending: Internal Medicine | Admitting: Internal Medicine

## 2014-09-07 NOTE — Telephone Encounter (Signed)
LEt him know I will call him in PM to discuss. Ideally no bronch when in acute infection  Please send message back with number to call

## 2014-09-07 NOTE — Pre-Procedure Instructions (Signed)
Cassidy Tabet Kossman  09/07/2014   Your procedure is scheduled on:  09-08-2014    Wednesday   Report to [facility] Admitting at 8:30 AM .  Call this number if you have problems the morning of surgery: [phone]   Remember:   Do not eat food or drink liquids after midnight.    Take these medicines the morning of surgery with A SIP OF WATER: Tylenol as needed,inhaler as needed,mucinex as needed,gabapentin(neurontin),protonix   Do not wear jewelry  Do not wear lotions, powders, or perfumes. You may not wear deodorant.   Do not shave 48 hours prior to surgery. Men may shave face and neck.  Do not bring valuables to the hospital.  [facility] is not responsible  for any belongings or valuables.               Contacts, dentures or bridgework may not be worn into surgery.  .                 Patients discharged the day of surgery will not be allowed to drive home.   Name and phone number of your driver:     Special Instructions: See attach sheet for instructions on [facility]  Shower/bath     Please read over the following fact sheets that you were given: Pain Booklet

## 2014-09-07 NOTE — Telephone Encounter (Signed)
Orders done - I spoke to short stayl; the will get orders now  SPoke to patient just now: he is on zpak for yellow mucus. He does nto want to cancel procedure. Feels fine otherwise. Will asess him tomorrow preop and decide but most likely go ahead  Dr. Brand Males, M.D., [facility].C.P Pulmonary and Critical Care Medicine Staff Physician [place] [facility] Pager: [phone], If no answer or between  15:00h - 7:00h: call [phone]  09/07/2014 1:49 PM

## 2014-09-07 NOTE — Telephone Encounter (Signed)
Called pt. He reports he has to be at work at 2 PM. He can be reached at the # above. He does not feel like he has an infection and would like MR to call as soon as he can.

## 2014-09-07 NOTE — Telephone Encounter (Signed)
I am in ICU rotation and will call as soon as I can; hipefully before 2pm. Please tell short stay I was planning to do order this afternoon after ICU rounds around 13.30h.

## 2014-09-07 NOTE — Telephone Encounter (Signed)
Called Gregory Fuller and she is aware. Will forward back to MR as a reminder to call pt

## 2014-09-07 NOTE — Telephone Encounter (Signed)
Spoke with Caren Griffins at [facility], requesting orders for EBUS scheduled 09/08/14 10:30am There are no orders in the system for the procedure tomorrow. Needing all orders placed and a verbal order of consent. Order for consent states, what procedure is being done and for what reason, diagnosis?  Please advise Dr Chase Caller. Thanks.

## 2014-09-07 NOTE — Telephone Encounter (Signed)
See prior phone note. Caren Griffins aware orders will be done this afternoon

## 2014-09-07 NOTE — Telephone Encounter (Signed)
Pt saw PCP today and was rx'd Azithromycin 250 mg (ZPAK) for ? Bronchitis.he is having bronch tomorrow and wants to know if he should wait and take this after the bronch? Please advise MR thanks

## 2014-09-07 NOTE — Progress Notes (Signed)
Spoke with Dr. Chase Caller, no need for chest x-ray or labs,had labs and CXR on 08-19-2014.

## 2014-09-08 ENCOUNTER — Ambulatory Visit (HOSPITAL_COMMUNITY): Payer: 59 | Admitting: Anesthesiology

## 2014-09-08 ENCOUNTER — Encounter (HOSPITAL_COMMUNITY)
Admission: RE | Disposition: A | Discharge status: Home / Self Care | Payer: Self-pay | Source: Ambulatory Visit | Attending: Internal Medicine

## 2014-09-08 ENCOUNTER — Ambulatory Visit (HOSPITAL_COMMUNITY)
Admission: RE | Admit: 2014-09-08 | Discharge: 2014-09-08 | Disposition: A | Discharge status: Home / Self Care | Payer: 59 | Source: Ambulatory Visit | Attending: Internal Medicine | Admitting: Internal Medicine

## 2014-09-08 ENCOUNTER — Telehealth: Payer: Self-pay | Admitting: Internal Medicine

## 2014-09-08 ENCOUNTER — Encounter (HOSPITAL_COMMUNITY): Payer: Self-pay | Admitting: *Deleted

## 2014-09-08 DIAGNOSIS — Z7952 Long term (current) use of systemic steroids: Secondary | ICD-10-CM | POA: Insufficient documentation

## 2014-09-08 DIAGNOSIS — R590 Localized enlarged lymph nodes: Secondary | ICD-10-CM

## 2014-09-08 DIAGNOSIS — R599 Enlarged lymph nodes, unspecified: Secondary | ICD-10-CM | POA: Diagnosis present

## 2014-09-08 DIAGNOSIS — R05 Cough: Secondary | ICD-10-CM | POA: Insufficient documentation

## 2014-09-08 DIAGNOSIS — Z885 Allergy status to narcotic agent status: Secondary | ICD-10-CM | POA: Diagnosis not present

## 2014-09-08 DIAGNOSIS — R0789 Other chest pain: Secondary | ICD-10-CM | POA: Diagnosis not present

## 2014-09-08 DIAGNOSIS — M546 Pain in thoracic spine: Secondary | ICD-10-CM | POA: Insufficient documentation

## 2014-09-08 DIAGNOSIS — R911 Solitary pulmonary nodule: Secondary | ICD-10-CM | POA: Insufficient documentation

## 2014-09-08 DIAGNOSIS — R0600 Dyspnea, unspecified: Secondary | ICD-10-CM | POA: Insufficient documentation

## 2014-09-08 DIAGNOSIS — J45909 Unspecified asthma, uncomplicated: Secondary | ICD-10-CM | POA: Insufficient documentation

## 2014-09-08 HISTORY — PX: VIDEO BRONCHOSCOPY WITH ENDOBRONCHIAL ULTRASOUND: SHX6177

## 2014-09-08 HISTORY — DX: Bronchitis, not specified as acute or chronic: J40

## 2014-09-08 SURGERY — BRONCHOSCOPY, WITH EBUS
Anesthesia: General | Site: Chest

## 2014-09-08 MED ORDER — GLYCOPYRROLATE 0.2 MG/ML IJ SOLN
INTRAMUSCULAR | Status: AC
  Filled 2014-09-08: qty 2

## 2014-09-08 MED ORDER — ONDANSETRON HCL 4 MG/2ML IJ SOLN: 4.0000 mg | Freq: Four times a day (QID) | INTRAMUSCULAR | Status: DC | PRN

## 2014-09-08 MED ORDER — GLYCOPYRROLATE 0.2 MG/ML IJ SOLN
INTRAMUSCULAR | Status: DC | PRN
  Administered 2014-09-08: 0.4 mg via INTRAVENOUS

## 2014-09-08 MED ORDER — MIDAZOLAM HCL 5 MG/5ML IJ SOLN
INTRAMUSCULAR | Status: DC | PRN
  Administered 2014-09-08: 2 mg via INTRAVENOUS

## 2014-09-08 MED ORDER — NEOSTIGMINE METHYLSULFATE 10 MG/10ML IV SOLN
INTRAVENOUS | Status: DC | PRN
Start: 2014-09-08 — End: 2014-09-08
  Administered 2014-09-08: 3 mg via INTRAVENOUS

## 2014-09-08 MED ORDER — PROPOFOL 10 MG/ML IV BOLUS
INTRAVENOUS | Status: DC | PRN
  Administered 2014-09-08: 200 mg via INTRAVENOUS
  Administered 2014-09-08: 40 mg via INTRAVENOUS

## 2014-09-08 MED ORDER — FENTANYL CITRATE 0.05 MG/ML IJ SOLN
INTRAMUSCULAR | Status: AC
  Filled 2014-09-08: qty 5

## 2014-09-08 MED ORDER — FENTANYL CITRATE 0.05 MG/ML IJ SOLN: 25.0000 ug | INTRAMUSCULAR | Status: DC | PRN

## 2014-09-08 MED ORDER — ROCURONIUM BROMIDE 100 MG/10ML IV SOLN
INTRAVENOUS | Status: DC | PRN
  Administered 2014-09-08: 30 mg via INTRAVENOUS
  Administered 2014-09-08: 10 mg via INTRAVENOUS

## 2014-09-08 MED ORDER — PROPOFOL 10 MG/ML IV BOLUS
INTRAVENOUS | Status: AC
  Filled 2014-09-08: qty 20

## 2014-09-08 MED ORDER — LACTATED RINGERS IV SOLN
INTRAVENOUS | Status: DC
  Administered 2014-09-08: 09:00:00 via INTRAVENOUS

## 2014-09-08 MED ORDER — ALBUTEROL SULFATE HFA 108 (90 BASE) MCG/ACT IN AERS: 2.0000 | INHALATION_SPRAY | Freq: Four times a day (QID) | RESPIRATORY_TRACT | Status: DC | PRN

## 2014-09-08 MED ORDER — FENTANYL CITRATE 0.05 MG/ML IJ SOLN
INTRAMUSCULAR | Status: DC | PRN
  Administered 2014-09-08: 100 ug via INTRAVENOUS

## 2014-09-08 MED ORDER — LIDOCAINE HCL 4 % MT SOLN
OROMUCOSAL | Status: DC | PRN
  Administered 2014-09-08: 2 mL via TOPICAL

## 2014-09-08 MED ORDER — ONDANSETRON HCL 4 MG/2ML IJ SOLN
INTRAMUSCULAR | Status: AC
  Filled 2014-09-08: qty 2

## 2014-09-08 MED ORDER — PHENYLEPHRINE HCL 10 MG/ML IJ SOLN
10.0000 mg | INTRAMUSCULAR | Status: DC | PRN
  Administered 2014-09-08: 10 ug/min via INTRAVENOUS

## 2014-09-08 MED ORDER — ONDANSETRON HCL 4 MG/2ML IJ SOLN
INTRAMUSCULAR | Status: DC | PRN
  Administered 2014-09-08: 4 mg via INTRAVENOUS

## 2014-09-08 MED ORDER — LIDOCAINE HCL (CARDIAC) 20 MG/ML IV SOLN
INTRAVENOUS | Status: AC
  Filled 2014-09-08: qty 5

## 2014-09-08 MED ORDER — ROCURONIUM BROMIDE 50 MG/5ML IV SOLN
INTRAVENOUS | Status: AC
  Filled 2014-09-08: qty 1

## 2014-09-08 MED ORDER — MIDAZOLAM HCL 2 MG/2ML IJ SOLN
INTRAMUSCULAR | Status: AC
  Filled 2014-09-08: qty 2

## 2014-09-08 MED ORDER — LIDOCAINE HCL (CARDIAC) 20 MG/ML IV SOLN
INTRAVENOUS | Status: DC | PRN
  Administered 2014-09-08: 60 mg via INTRAVENOUS

## 2014-09-08 MED ORDER — 0.9 % SODIUM CHLORIDE (POUR BTL) OPTIME
TOPICAL | Status: DC | PRN
  Administered 2014-09-08: 1000 mL

## 2014-09-08 SURGICAL SUPPLY — 27 items
BLADE 10 SAFETY STRL DISP (BLADE) ×2 IMPLANT
BRUSH CYTOL CELLEBRITY 1.5X140 (MISCELLANEOUS) IMPLANT
CANISTER SUCTION 2500CC (MISCELLANEOUS) ×2 IMPLANT
CONT SPEC 4OZ CLIKSEAL STRL BL (MISCELLANEOUS) ×2 IMPLANT
COVER TABLE BACK 60X90 (DRAPES) ×2 IMPLANT
FORCEPS BIOP RJ4 1.8 (CUTTING FORCEPS) IMPLANT
GAUZE SPONGE 4X4 12PLY STRL (GAUZE/BANDAGES/DRESSINGS) ×2 IMPLANT
GLOVE BIO SURGEON STRL SZ 6.5 (GLOVE) ×1 IMPLANT
GLOVE BIOGEL PI IND STRL 6.5 (GLOVE) IMPLANT
GLOVE BIOGEL PI INDICATOR 6.5 (GLOVE) ×1
GLOVE SURG SIGNA 7.5 PF LTX (GLOVE) ×1 IMPLANT
GLOVE SURG SS PI 6.5 STRL IVOR (GLOVE) ×1 IMPLANT
KIT CLEAN ENDO COMPLIANCE (KITS) ×2 IMPLANT
KIT ROOM TURNOVER OR (KITS) ×2 IMPLANT
MARKER SKIN DUAL TIP RULER LAB (MISCELLANEOUS) ×2 IMPLANT
NDL BIOPSY TRANSBRONCH 21G (NEEDLE) IMPLANT
NEEDLE BIOPSY TRANSBRONCH 21G (NEEDLE) IMPLANT
NEEDLE SYS SONOTIP II EBUSTBNA (NEEDLE) ×1 IMPLANT
NS IRRIG 1000ML POUR BTL (IV SOLUTION) ×2 IMPLANT
OIL SILICONE PENTAX (PARTS (SERVICE/REPAIRS)) ×2 IMPLANT
PAD ARMBOARD 7.5X6 YLW CONV (MISCELLANEOUS) ×4 IMPLANT
SYR 20CC LL (SYRINGE) ×2 IMPLANT
SYR 20ML ECCENTRIC (SYRINGE) ×4 IMPLANT
SYR 5ML LUER SLIP (SYRINGE) ×2 IMPLANT
TOWEL OR 17X24 6PK STRL BLUE (TOWEL DISPOSABLE) ×2 IMPLANT
TRAP SPECIMEN MUCOUS 40CC (MISCELLANEOUS) ×2 IMPLANT
TUBE CONNECTING 20X1/4 (TUBING) ×2 IMPLANT

## 2014-09-08 NOTE — Op Note (Addendum)
Name:  [name] MRN:  [id number] DOB:  01/19/1981  PROCEDURE NOTE  Procedure(s): Flexible bronchoscopy [phone]) Endobronchial ultrasound ([id number]) Transbronchial needle aspiration (20947) of the 10R and STATION 7 NODES    Indications:  Hilar / mediastinal lymphadenopathy. Presumed Sarcoid  Consent:  Procedure, benefits, risks and alternatives discussed.  Questions answered.  Consent obtained.  Anesthesia:  General endotracheal.  Procedure summary:  Appropriate equipment was assembled.  The patient was brought to the operating room and identified as Gregory Fuller.  Safety timeout was performed. The patient was placed supine on the operating table, airway established and general anesthesia administered by Anesthesia team.   After the appropriate level of anesthesia was assured, flexible video bronchoscope was lubricated and inserted through the endotracheal tube.    Airway examination was performed bilaterally to subsegmental level.  Minimal clear secretions were noted, mucosa appeared normal and no endobronchial lesions were identified.  Endobronchial ultrasound video bronchoscope was then lubricated and inserted through the endotracheal tube. Surveillance of the mediastinal and and bilateral hilar lymph node stations was performed.  Pathologically enlarged lymph nodes were noted.  Endobronchial ultrasound guided transbronchial needle aspiration of 10R (passes x3 for onsite cytology  - prelim resumt: rare granuloma +), station 7 subcarinal(passes x 3 x 2 for cytology; and x 3 for flow cytometry and x 1 for microwas performed, after which EBUS bronchoscope was withdrawn.  Flexible video bronchoscope was used again after hemostasis was assure, the bronchoscope was withdrawn.  The patient was extubated in operating room and transferred to PACU. Post-procedure chest x-ray was ordered.  Specimens sent: As above  Complications:  No immediate complications were noted.  Hemodynamic  parameters and oxygenation remained stable throughout the procedure.  Estimated blood loss:  Less then 1 m;L oozing noted after first TBNA in STation 10R but stopped  IMP Likely sarcoid; await path  Dr. Brand Males, M.D., [facility].C.P Pulmonary and Critical Care Medicine Staff Physician [place] [facility] Pager: [phone], If no answer or between  15:00h - 7:00h: call [phone]  09/08/2014 11:44 AM

## 2014-09-08 NOTE — Discharge Instructions (Signed)
Please have someone to drive you home Please be careful with activities for next 24 hours You can eat 2-4 hours after getting home provided you are fully alert, able to cough, and are not nauseated or vomiting and     feel well You are expected to have low grade fever or cough some amount of blood for next 24-48 hours; if this worsens call us IF you are very short of breath or coughing blood or chest pain or not feeling well, call us [phone] anytime or go to emergency room  Future Appointments Date Time Provider [place]  09/14/2014 9:30 AM Melvenia Needles, NP [facility] None  12/02/2014 8:00 AM Kathrynn Ducking, MD [facility] None

## 2014-09-08 NOTE — Telephone Encounter (Signed)
Left detailed message on pt VM. Nothing further needed

## 2014-09-08 NOTE — Anesthesia Preprocedure Evaluation (Addendum)
Anesthesia Evaluation  Patient identified by MRN, date of birth, ID band Patient awake    Reviewed: Allergy & Precautions, H&P , NPO status , Patient's Chart, lab work & pertinent test results  Airway Mallampati: I   Neck ROM: full    Dental  (+) Teeth Intact   Pulmonary shortness of breath and with exertion, asthma ,  Lung mass         Cardiovascular negative cardio ROS      Neuro/Psych Subacute transverse myelitis.    GI/Hepatic   Endo/Other  obese  Renal/GU      Musculoskeletal   Abdominal   Peds  Hematology   Anesthesia Other Findings   Reproductive/Obstetrics                            Anesthesia Physical Anesthesia Plan  ASA: II  Anesthesia Plan: General   Post-op Pain Management:    Induction: Intravenous  Airway Management Planned: Oral ETT  Additional Equipment:   Intra-op Plan:   Post-operative Plan: Extubation in OR  Informed Consent: I have reviewed the patients History and Physical, chart, labs and discussed the procedure including the risks, benefits and alternatives for the proposed anesthesia with the patient or authorized representative who has indicated his/her understanding and acceptance.     Plan Discussed with: CRNA, Anesthesiologist and Surgeon  Anesthesia Plan Comments:         Anesthesia Quick Evaluation

## 2014-09-08 NOTE — Anesthesia Procedure Notes (Addendum)
Procedure Name: Intubation Date/Time: 09/08/2014 10:12 AM Performed by: Rush Farmer E Pre-anesthesia Checklist: Patient identified, Emergency Drugs available, Suction available, Patient being monitored and Timeout performed Patient Re-evaluated:Patient Re-evaluated prior to inductionOxygen Delivery Method: Circle system utilized Preoxygenation: Pre-oxygenation with 100% oxygen Intubation Type: IV induction Ventilation: Mask ventilation without difficulty Laryngoscope Size: Mac and 4 Grade View: Grade II Tube type: Oral Tube size: 8.5 mm Number of attempts: 1 Airway Equipment and Method: Stylet and LTA kit utilized Placement Confirmation: ETT inserted through vocal cords under direct vision,  positive ETCO2 and breath sounds checked- equal and bilateral Secured at: 21 cm Tube secured with: Tape Dental Injury: Teeth and Oropharynx as per pre-operative assessment

## 2014-09-08 NOTE — Interval H&P Note (Signed)
09/08/2014  Patient presents for eBUS, TBNA, flexibble bronch  No interim complaints except he is on ZPAK D3 and feels well. HE does not want procedure canceled because of Zpak. No yellow sputum currently. No other issues. Of prednisone  Vitals Filed Vitals:   09/08/14 0902  BP: 143/78  Pulse: 81  Temp: 97.8 F (36.6 C)  TempSrc: Oral  Height: 5\' 6"  (1.676 m)  Weight: 94.031 kg (207 lb 4.8 oz)  SpO2: 100%    Exam  - obeese  - pleasant  - clear chest - normeal heart sounds - obese soft abdomen - neuro intact  -ext: no edema  Labs  - reviewed and acceptable  A Hilar/MEdiastinal Adenopathy Suspect Sarcoid  P Risks of pneumothorax, hemothorax, sedation/anesthesia complications such as cardiac or respiratory arrest or hypotension, stroke and bleeding all explained. Benefits of diagnosis but limitations of non-diagnosis also explained. Patient verbalized understanding and wished to proceed.    Dr. Brand Males, M.D., [facility].C.P Pulmonary and Critical Care Medicine Staff Physician [place] [facility] Pager: [phone], If no answer or between  15:00h - 7:00h: call [phone]  09/08/2014 10:02 AM

## 2014-09-08 NOTE — Telephone Encounter (Signed)
Triage  I am going to start a bronch on him: he wants a refill for albuterol called in. Please do that and ok to leave mssage on his cell phone. HE will pick it up post procedure  Thanks  Dr. Brand Males, M.D., [facility].C.P Pulmonary and Critical Care Medicine Staff Physician [place] [facility] Pager: [phone], If no answer or between  15:00h - 7:00h: call [phone]  09/08/2014 10:02 AM

## 2014-09-08 NOTE — Transfer of Care (Signed)
Immediate Anesthesia Transfer of Care Note  Patient: Gregory Fuller  Procedure(s) Performed: Procedure(s): VIDEO BRONCHOSCOPY WITH ENDOBRONCHIAL ULTRASOUND (N/A)  Patient Location: PACU  Anesthesia Type:General  Level of Consciousness: awake, alert  and oriented  Airway & Oxygen Therapy: Patient Spontanous Breathing and Patient connected to face mask oxygen  Post-op Assessment: Report given to PACU RN, Post -op Vital signs reviewed and stable and Patient moving all extremities X 4  Post vital signs: Reviewed and stable  Complications: No apparent anesthesia complications

## 2014-09-08 NOTE — Anesthesia Postprocedure Evaluation (Signed)
Anesthesia Post Note  Patient: Gregory Fuller  Procedure(s) Performed: Procedure(s) (LRB): VIDEO BRONCHOSCOPY WITH ENDOBRONCHIAL ULTRASOUND (N/A)  Anesthesia type: General  Patient location: PACU  Post pain: Pain level controlled and Adequate analgesia  Post assessment: Post-op Vital signs reviewed, Patient's Cardiovascular Status Stable, Respiratory Function Stable, Patent Airway and Pain level controlled  Last Vitals:  Filed Vitals:   09/08/14 1230  BP: 115/61  Pulse: 64  Temp: 37.1 C  Resp: 18    Post vital signs: Reviewed and stable  Level of consciousness: awake, alert  and oriented  Complications: No apparent anesthesia complications

## 2014-09-08 NOTE — H&P (View-Only) (Signed)
Subjective:    Patient ID: Gregory Fuller, male    DOB: May 01, 1981, 33 y.o.   MRN: [id number]  HPI    OV 09/01/2014  Chief Complaint  Patient presents with  . Follow-up    Pt states he feels he is doing well. Pt c/o prod cough with yellow mucus and left sided chest tightness under left breast that radiates to left mid back.  Pt denies SOB.    Follow-up mediastinal adenopathy  - He continues to have upper thoracic back pain along with left-sided retrocardiac dull feeling. Otherwise asymptomatic. He is here to review his PET scan which shows a small subcarinal PET heart adenopathy and also station 10 left-sided adenopathy that is PET hot. Both can be reached with endobronchial ultrasound transbronchial needle aspiration biopsy. He also has intense diffuse uptake in his cervical and thoracic spine. Top differential diagnosis per radiology is sarcoidosis.  - I spoke to his mom on the telephone and she is extremely frustrated that over several weeks specific diagnosis has been established and the possibilities and differential diagnosis keep changing. She is wondering about a referral to [facility] or [facility] but for now she is ok with procedureal workup here  - Of note, he is on chronic steroids since his admission 08/04/14 to hospital  - The following PET scan was reviewed with the patient  EXAM:  NUCLEAR MEDICINE PET SKULL BASE TO THIGH  TECHNIQUE:  11.1 mCi F-18 FDG was injected intravenously. Full-ring PET imaging  was performed from the skull base to thigh after the radiotracer. CT  data was obtained and used for attenuation correction and anatomic  localization.  FASTING BLOOD GLUCOSE: Value: 104 mg/dl  COMPARISON: CT 08/05/2014  FINDINGS:  NECK  No hypermetabolic lymph nodes in the neck.  CHEST  Cluster of right infrahilar lymph nodes described on comparison CT  are hypermetabolic with SUV max equal 14.6. The lymph node volumes  are difficult to measure on this  noncontrast CT. There are  approximately 3 hypermetabolic lymph nodes. Additional small  hypermetabolic subcarinal lymph node also noted along the right  bronchus intermedius on image 70.  Review of the lung parenchyma demonstrates a nodule in the posterior  medial right lower lobe against the pleural surface measuring 8 mm  on image 46, series 6. This nodule is mildly hypermetabolic with SUV  max of 3.2  There is long segments of abnormal hypermetabolic activity  associated with the spinal cord of the cervical spine and thoracic  spine down to the T12 level.  ABDOMEN/PELVIS  No abnormal hypermetabolic activity within the liver, pancreas,  adrenal glands, or spleen. No hypermetabolic lymph nodes in the  abdomen or pelvis.  SKELETON  No focal hypermetabolic activity to suggest skeletal metastasis.  IMPRESSION:  1. Cluster of hypermetabolic right infrahilar lymph nodes. This  activity is greater than reactive adenopathy. Consider granulomatous  disease versus malignancy including lymphoma. Atypical pattern for  both.  2. Small mildly metabolic nodule along the pleural surface of the  right lower lobe favors a small site of infection or inflammation.  3. Long segments of abnormal metabolic activity involving the spinal  cord previous of cervical spine and thoracic spine. This corresponds  to the abnormal dural enhancement on comparison MRI . Presumably  this relates to the hilar adenopathy which would favor granulomas  disease or lymphoma rather than a primary CNS process).  Electronically Signed  By: Suzy Bouchard M.D.  On: 08/27/2014 11:11   Review of  Systems  Constitutional: Negative for fever and unexpected weight change.  HENT: Negative for congestion, dental problem, ear pain, nosebleeds, postnasal drip, rhinorrhea, sinus pressure, sneezing, sore throat and trouble swallowing.   Eyes: Negative for redness and itching.  Respiratory: Positive for cough, chest tightness and  shortness of breath. Negative for wheezing.   Cardiovascular: Negative for palpitations and leg swelling.  Gastrointestinal: Negative for nausea and vomiting.  Genitourinary: Negative for dysuria.  Musculoskeletal: Negative for joint swelling.  Skin: Negative for rash.  Neurological: Negative for headaches.  Hematological: Does not bruise/bleed easily.  Psychiatric/Behavioral: Negative for dysphoric mood. The patient is not nervous/anxious.        Objective:   Physical Exam  Nursing note and vitals reviewed. Constitutional: He is oriented to person, place, and time. He appears well-developed and well-nourished. No distress.  cushingoid  HENT:  Head: Normocephalic and atraumatic.  Right Ear: External ear normal.  Left Ear: External ear normal.  Mouth/Throat: Oropharynx is clear and moist. No oropharyngeal exudate.  Eyes: Conjunctivae and EOM are normal. Pupils are equal, round, and reactive to light. Right eye exhibits no discharge. Left eye exhibits no discharge. No scleral icterus.  Neck: Normal range of motion. Neck supple. No JVD present. No tracheal deviation present. No thyromegaly present.  Cardiovascular: Normal rate, regular rhythm and intact distal pulses.  Exam reveals no gallop and no friction rub.   No murmur heard. Pulmonary/Chest: Effort normal and breath sounds normal. No respiratory distress. He has no wheezes. He has no rales. He exhibits no tenderness.  Abdominal: Soft. Bowel sounds are normal. He exhibits no distension and no mass. There is no tenderness. There is no rebound and no guarding.  Musculoskeletal: Normal range of motion. He exhibits no edema and no tenderness.  Lymphadenopathy:    He has no cervical adenopathy.  Neurological: He is alert and oriented to person, place, and time. He has normal reflexes. No cranial nerve deficit. Coordination normal.  Skin: Skin is warm and dry. No rash noted. He is not diaphoretic. No erythema. No pallor.  Psychiatric: He  has a normal mood and affect. His behavior is normal. Judgment and thought content normal.     Filed Vitals:   09/01/14 0933  BP: 152/86  Pulse: 88  Height: 5\' 6"  (1.676 m)  Weight: 212 lb (96.163 kg)  SpO2: 99%        Assessment & Plan:  #Hilar Adenopathy and dyspnea   - Given age 74, AA ethnicity, most likey ddx for cause of this is SARCOID. Per [facility] - MRI and PET scan findings on C Spine could fit in with sarcoid. . Currently neither hilar node nor spinal issues has a definite diagnosis. Certainly compated to spine, hilar node more easily accessible for diagnosis. He agrees with diagnostic procedure workup.   - We discussed mediastinascopy as gold standard but given his ethniticyt he will run risk for keloid. Therefore, we weill start with EBUS. He understands that there is risk for non-diagnosis with this procedure in which case we can refer him to mediastinascopy.   - the above plan including Risks of pneumothorax, hemothorax, sedation/anesthesia complications such as cardiac or respiratory arrest or hypotension, stroke and bleeding all explained. Benefits of diagnosis but limitations of non-diagnosis also explained. Patient verbalized understanding and wished to proceed.   - His mom aware as well  - Procedure planned for 09/08/14 [facility]   > 50% of this > 25 min visit spent in face to face  counseling (15 min visit converted to 25 min)    Dr. Brand Males, M.D., [facility].C.P Pulmonary and Critical Care Medicine Staff Physician [place] [facility] Pager: [phone], If no answer or between  15:00h - 7:00h: call [phone]  09/05/2014 10:40 PM

## 2014-09-09 ENCOUNTER — Encounter (HOSPITAL_COMMUNITY): Payer: Self-pay | Admitting: Internal Medicine

## 2014-09-09 ENCOUNTER — Telehealth: Payer: Self-pay | Admitting: Internal Medicine

## 2014-09-09 NOTE — Telephone Encounter (Signed)
Tammy  I did EBU on his 10R and station 7 nodes. Prelim in OR cytologist said rare granuloma seen on 10R so I thought sarcid. BUt this is not reported in formal report. Shows lympphcytes. Currently non diagnostic.   rec = i did do flow cytometry - ensure this is not showing lymphoma  - if non diagnostic refer to CVTS for mediastinascopy   Thanks  Dr. Brand Males, M.D., [facility].C.P Pulmonary and Critical Care Medicine Staff Physician [place] [facility] Pager: [phone], If no answer or between  15:00h - 7:00h: call [phone]  09/09/2014 11:13 PM

## 2014-09-10 NOTE — Telephone Encounter (Signed)
Patient calling to state that Dr. Lily Kocher has not received the referral yet, please return call and advise.

## 2014-09-11 LAB — TISSUE CULTURE
CULTURE: NO GROWTH
Culture: NO GROWTH
GRAM STAIN: NONE SEEN
Gram Stain: NONE SEEN
Special Requests: 10

## 2014-09-13 NOTE — Telephone Encounter (Signed)
Spoke to patient and referral has been re faxed to Dr. Steward Drone at [phone] with confirmation received.

## 2014-09-14 ENCOUNTER — Encounter: Payer: Self-pay | Admitting: Adult Health

## 2014-09-14 ENCOUNTER — Ambulatory Visit (INDEPENDENT_AMBULATORY_CARE_PROVIDER_SITE_OTHER): Payer: 59 | Admitting: Adult Health

## 2014-09-14 VITALS — BP 136/72 | HR 78 | Temp 97.7°F | Ht 66.0 in | Wt 215.8 lb

## 2014-09-14 DIAGNOSIS — R590 Localized enlarged lymph nodes: Secondary | ICD-10-CM

## 2014-09-14 DIAGNOSIS — R599 Enlarged lymph nodes, unspecified: Secondary | ICD-10-CM

## 2014-09-14 NOTE — Patient Instructions (Signed)
We are referring you to CVTS for consideration of mediastinoscopy Please call Dr. Jannifer Franklin at [facility] neurology regarding your prednisone dose Follow-up with Dr. Chase Caller in 4 weeks and As needed

## 2014-09-14 NOTE — Progress Notes (Signed)
Subjective:    Patient ID: Gregory Fuller, male    DOB: 08-27-1981, 33 y.o.   MRN: [id number]  HPI    OV 09/01/2014  Chief Complaint  Patient presents with  . Follow-up    Pt states he feels he is doing well. Pt c/o prod cough with yellow mucus and left sided chest tightness under left breast that radiates to left mid back.  Pt denies SOB.    Follow-up mediastinal adenopathy  - He continues to have upper thoracic back pain along with left-sided retrocardiac dull feeling. Otherwise asymptomatic. He is here to review his PET scan which shows a small subcarinal PET heart adenopathy and also station 10 left-sided adenopathy that is PET hot. Both can be reached with endobronchial ultrasound transbronchial needle aspiration biopsy. He also has intense diffuse uptake in his cervical and thoracic spine. Top differential diagnosis per radiology is sarcoidosis.  - I spoke to his mom on the telephone and she is extremely frustrated that over several weeks specific diagnosis has been established and the possibilities and differential diagnosis keep changing. She is wondering about a referral to [facility] or [facility] but for now she is ok with procedureal workup here  - Of note, he is on chronic steroids since his admission 08/04/14 to hospital  - The following PET scan was reviewed with the patient  EXAM:  NUCLEAR MEDICINE PET SKULL BASE TO THIGH  TECHNIQUE:  11.1 mCi F-18 FDG was injected intravenously. Full-ring PET imaging  was performed from the skull base to thigh after the radiotracer. CT  data was obtained and used for attenuation correction and anatomic  localization.  FASTING BLOOD GLUCOSE: Value: 104 mg/dl  COMPARISON: CT 08/05/2014  FINDINGS:  NECK  No hypermetabolic lymph nodes in the neck.  CHEST  Cluster of right infrahilar lymph nodes described on comparison CT  are hypermetabolic with SUV max equal 14.6. The lymph node volumes  are difficult to measure on this  noncontrast CT. There are  approximately 3 hypermetabolic lymph nodes. Additional small  hypermetabolic subcarinal lymph node also noted along the right  bronchus intermedius on image 70.  Review of the lung parenchyma demonstrates a nodule in the posterior  medial right lower lobe against the pleural surface measuring 8 mm  on image 46, series 6. This nodule is mildly hypermetabolic with SUV  max of 3.2  There is long segments of abnormal hypermetabolic activity  associated with the spinal cord of the cervical spine and thoracic  spine down to the T12 level.  ABDOMEN/PELVIS  No abnormal hypermetabolic activity within the liver, pancreas,  adrenal glands, or spleen. No hypermetabolic lymph nodes in the  abdomen or pelvis.  SKELETON  No focal hypermetabolic activity to suggest skeletal metastasis.  IMPRESSION:  1. Cluster of hypermetabolic right infrahilar lymph nodes. This  activity is greater than reactive adenopathy. Consider granulomatous  disease versus malignancy including lymphoma. Atypical pattern for  both.  2. Small mildly metabolic nodule along the pleural surface of the  right lower lobe favors a small site of infection or inflammation.  3. Long segments of abnormal metabolic activity involving the spinal  cord previous of cervical spine and thoracic spine. This corresponds  to the abnormal dural enhancement on comparison MRI . Presumably  this relates to the hilar adenopathy which would favor granulomas  disease or lymphoma rather than a primary CNS process).  Electronically Signed  By: Suzy Bouchard M.D.  On: 08/27/2014 11:11   09/14/2014 Follow  up EBUS (Mediastinal Adenopathy) Pt returns to discuss recent EBUS results  Recent  PET scan which shows a small subcarinal PET heart adenopathy and also station 10 left-sided adenopathy that is PET hot He underwent EBUS on 09/08/14 that preliminary said rate granuloma however final was non diagnostic with  With lymphocytes  noted.  Flow cytometry there was not sufficient material.  We discussed these results.  He will need referral to CVTS for Mediastinoscopy .  He feels he is some better with decreased cough  Still has back and chest wall pain and fullness that worsens with movement.  Stopped prednisone for 2 weeks. Unclear if he was suppose to stop or not.  Was on prolonged prednisone from last discharge, last dose prednisone 10mg  daily          Review of Systems  Constitutional: Negative for fever and unexpected weight change.  HENT: Negative for congestion, dental problem, ear pain, nosebleeds, postnasal drip, rhinorrhea, sinus pressure, sneezing, sore throat and trouble swallowing.   Eyes: Negative for redness and itching.  Respiratory: Positive for cough, chest tightness and shortness of breath. Negative for wheezing.   Cardiovascular: Negative for palpitations and leg swelling.  Gastrointestinal: Negative for nausea and vomiting.  Genitourinary: Negative for dysuria.  usculoskeletal: Negative for joint swelling.  Skin: Negative for rash.  Neurological: Negative for headaches.  Hematological: Does not bruise/bleed easily.  Psychiatric/Behavioral: Negative for dysphoric mood. The patient is not nervous/anxious.        Objective:   Physical Exam  Nursing note and vitals reviewed. Constitutional: He is oriented to person, place, and time. He appears well-developed and well-nourished. No distress.  cushingoid  HENT:  Head: Normocephalic and atraumatic.  Right Ear: External ear normal.  Left Ear: External ear normal.  Mouth/Throat: Oropharynx is clear and moist. No oropharyngeal exudate.  Eyes: Conjunctivae and EOM are normal. Pupils are equal, round, and reactive to light. Right eye exhibits no discharge. Left eye exhibits no discharge. No scleral icterus.  Neck: Normal range of motion. Neck supple. No JVD present. No tracheal deviation present. No thyromegaly present.  Cardiovascular:  Normal rate, regular rhythm and intact distal pulses.  Exam reveals no gallop and no friction rub.   No murmur heard. Pulmonary/Chest: Effort normal and breath sounds normal. No respiratory distress. He has no wheezes. He has no rales. He exhibits no tenderness.  Abdominal: Soft. Bowel sounds are normal. He exhibits no distension and no mass. There is no tenderness. There is no rebound and no guarding.  Musculoskeletal: Normal range of motion. He exhibits no edema and no tenderness.  Lymphadenopathy:    He has no cervical adenopathy.  Neurological: He is alert and oriented to person, place, and time. He has normal reflexes. No cranial nerve deficit. Coordination normal.  Skin: Skin is warm and dry. No rash noted. He is not diaphoretic. No erythema. No pallor.  Psychiatric: He has a normal mood and affect. His behavior is normal. Judgment and thought content normal.          Assessment & Plan:

## 2014-09-14 NOTE — Assessment & Plan Note (Signed)
EBUS and flow cytometry nondiagnositic  PET +  Will need further evaluation for tissue dx  Refer to [facility] for consideration of mediastinoscopy.

## 2014-09-14 NOTE — Addendum Note (Signed)
Addended by: Parke Poisson E on: 09/14/2014 11:04 AM   Modules accepted: Orders

## 2014-09-15 ENCOUNTER — Telehealth: Payer: Self-pay | Admitting: Neurology

## 2014-09-15 NOTE — Telephone Encounter (Signed)
I called patient. The patient is off of prednisone now, I would probably continue the medication for now at 20 mg daily. The patient likely has sarcoidosis, and he indicates that they are planning to do a resection of one of the lymph nodes in the mediastinum. This may give [place] a definitive diagnosis.

## 2014-09-15 NOTE — Telephone Encounter (Signed)
Patient stated he completed his Prednisone and questioning if he should continue.  Pleases call and advise.

## 2014-09-16 ENCOUNTER — Encounter: Payer: 59 | Admitting: Thoracic Surgery (Cardiothoracic Vascular Surgery)

## 2014-09-27 ENCOUNTER — Encounter (HOSPITAL_COMMUNITY): Admission: RE | Payer: Self-pay | Source: Ambulatory Visit

## 2014-09-27 ENCOUNTER — Encounter (HOSPITAL_COMMUNITY): Payer: 59

## 2014-09-27 ENCOUNTER — Ambulatory Visit (HOSPITAL_COMMUNITY): Admission: RE | Admit: 2014-09-27 | Payer: 59 | Source: Ambulatory Visit | Admitting: Internal Medicine

## 2014-09-27 SURGERY — ENDOBRONCHIAL ULTRASOUND (EBUS)
Anesthesia: General | Laterality: Bilateral

## 2014-09-28 ENCOUNTER — Telehealth: Payer: Self-pay | Admitting: Neurology

## 2014-09-28 DIAGNOSIS — R930 Abnormal findings on diagnostic imaging of skull and head, not elsewhere classified: Secondary | ICD-10-CM

## 2014-09-28 DIAGNOSIS — R937 Abnormal findings on diagnostic imaging of other parts of musculoskeletal system: Secondary | ICD-10-CM

## 2014-09-28 NOTE — Telephone Encounter (Signed)
I called the patient. The patient will be getting a lymph node biopsy. I will repeat the MRI of the brain and cervical spine.

## 2014-09-28 NOTE — Telephone Encounter (Signed)
-----   Message from Kathrynn Ducking, MD sent at 08/18/2014  8:06 AM EDT ----- Repeat MRI the brain and spinal cord with and without gadolinium enhancement.

## 2014-10-05 ENCOUNTER — Other Ambulatory Visit: Payer: Self-pay | Admitting: *Deleted

## 2014-10-05 ENCOUNTER — Institutional Professional Consult (permissible substitution) (INDEPENDENT_AMBULATORY_CARE_PROVIDER_SITE_OTHER): Payer: 59 | Admitting: Thoracic Surgery (Cardiothoracic Vascular Surgery)

## 2014-10-05 ENCOUNTER — Encounter: Payer: Self-pay | Admitting: Thoracic Surgery (Cardiothoracic Vascular Surgery)

## 2014-10-05 VITALS — BP 146/87 | HR 77 | Resp 16 | Ht 66.0 in | Wt 211.5 lb

## 2014-10-05 DIAGNOSIS — R590 Localized enlarged lymph nodes: Secondary | ICD-10-CM

## 2014-10-05 DIAGNOSIS — R599 Enlarged lymph nodes, unspecified: Secondary | ICD-10-CM

## 2014-10-05 LAB — FUNGUS CULTURE W SMEAR
FUNGAL SMEAR: NONE SEEN
FUNGAL SMEAR: NONE SEEN

## 2014-10-05 NOTE — Progress Notes (Signed)
PCP is Gregory Simmer, MD Referring Provider is Parrett, Fonnie Mu, NP  Chief Complaint  Patient presents with  . Adenopathy    Surgical eval, Chest CT 08/05/2014...right hilar adenopathy    HPI: 33 yo man with subcarinal and right hilar adenopathy.  Gregory Fuller is a 33 yo AAM with known asthma, occasional tobacco use, and recreational marijuana use. He was in his usual state of health until August when he fell in the tub and struck his back/neck. There was no loss of consciousness . His neck/back pain was treated with steroids per urgent care. He improved for a time but symptoms returned in October. He went to the ED and was evaluated by Neurology in ED. MRI of brain and spine was concerning for myelitis which was treated with steroids and he was admitted for further evaluation, including a LP. He also had a CT of the chest which showed right hilar +/- mediastinal adenopathy and a small lung nodule.  A PET showed the nodes to be hypermetabolic.  He had an EBUS which was nondiagnostic.   He still does not have a definitive diagnosis.   Past Medical History  Diagnosis Date  . Asthma   . Subacute transverse myelitis   . Bronchitis     Past Surgical History  Procedure Laterality Date  . No past surgeries    . Video bronchoscopy with endobronchial ultrasound N/A 09/08/2014    Procedure: VIDEO BRONCHOSCOPY WITH ENDOBRONCHIAL ULTRASOUND;  Surgeon: Brand Males, MD;  Location: [facility] OR;  Service: Thoracic;  Laterality: N/A;    Family History  Problem Relation Age of Onset  . Diabetes Other   . Thyroid disease Mother   . Seizures Father     Social History History  Substance Use Topics  . Smoking status: Never Smoker   . Smokeless tobacco: Never Used     Comment: pt states he smoked socially  . Alcohol Use: Yes     Comment: occassional weekends    Current Outpatient Prescriptions  Medication Sig Dispense Refill  . acetaminophen (TYLENOL) 500 MG tablet Take 750 mg by mouth every 6  (six) hours as needed for mild pain.     Marland Kitchen albuterol (PROVENTIL HFA;VENTOLIN HFA) 108 (90 BASE) MCG/ACT inhaler Inhale 2 puffs into the lungs every 6 (six) hours as needed for wheezing or shortness of breath. 1 Inhaler 6  . dextromethorphan-guaiFENesin (MUCINEX DM) 30-600 MG per 12 hr tablet Take 1 tablet by mouth 2 (two) times daily as needed for cough.    . gabapentin (NEURONTIN) 300 MG capsule Take 1 capsule (300 mg total) by mouth 2 (two) times daily. 60 capsule 3  . Multiple Vitamin (ONE-A-DAY MENS PO) Take 1 tablet by mouth daily.    . pantoprazole (PROTONIX) 40 MG tablet Take 1 tablet (40 mg total) by mouth daily. 60 tablet 0   No current facility-administered medications for this visit.    Allergies  Allergen Reactions  . Vicodin [Hydrocodone-Acetaminophen] Other (See Comments)    Made patient feel like he was in more pain    Review of Systems  Constitutional: Positive for diaphoresis. Negative for fever and chills.  Respiratory: Positive for chest tightness and wheezing (occassional).   Cardiovascular: Positive for chest pain (left sided).  Musculoskeletal: Positive for back pain.  Neurological: Positive for numbness (and tingling B LE).  Hematological: Negative for adenopathy.  All other systems reviewed and are negative.   BP 146/87 mmHg  Pulse 77  Resp 16  Ht 5\' 6"  (1.676 m)  Wt 211 lb 8 oz (95.936 kg)  BMI 34.15 kg/m2  SpO2 99% Physical Exam  Constitutional: He is oriented to person, place, and time. He appears well-developed and well-nourished. No distress.  Mildly cushingoid  HENT:  Head: Normocephalic and atraumatic.  Eyes: EOM are normal. Pupils are equal, round, and reactive to light.  Neck: Neck supple.  Cardiovascular: Normal rate, regular rhythm, normal heart sounds and intact distal pulses.   No murmur heard. Pulmonary/Chest: Effort normal and breath sounds normal. He has no wheezes.  Abdominal: Soft. There is no tenderness.  Musculoskeletal: He  exhibits no edema.  Lymphadenopathy:    He has no cervical adenopathy.  Neurological: He is alert and oriented to person, place, and time. No cranial nerve deficit.  Skin: Skin is warm and dry.  Vitals reviewed.    Diagnostic Tests: NUCLEAR MEDICINE PET SKULL BASE TO THIGH  TECHNIQUE: 11.1 mCi F-18 FDG was injected intravenously. Full-ring PET imaging was performed from the skull base to thigh after the radiotracer. CT data was obtained and used for attenuation correction and anatomic localization.  FASTING BLOOD GLUCOSE: Value: 104 mg/dl  COMPARISON: CT 08/05/2014  FINDINGS: NECK  No hypermetabolic lymph nodes in the neck.  CHEST  Cluster of right infrahilar lymph nodes described on comparison CT are hypermetabolic with SUV max equal 14.6. The lymph node volumes are difficult to measure on this noncontrast CT. There are approximately 3 hypermetabolic lymph nodes. Additional small hypermetabolic subcarinal lymph node also noted along the right bronchus intermedius on image 70.  Review of the lung parenchyma demonstrates a nodule in the posterior medial right lower lobe against the pleural surface measuring 8 mm on image 46, series 6. This nodule is mildly hypermetabolic with SUV max of 3.2  There is long segments of abnormal hypermetabolic activity associated with the spinal cord of the cervical spine and thoracic spine down to the T12 level.  ABDOMEN/PELVIS  No abnormal hypermetabolic activity within the liver, pancreas, adrenal glands, or spleen. No hypermetabolic lymph nodes in the abdomen or pelvis.  SKELETON  No focal hypermetabolic activity to suggest skeletal metastasis.  IMPRESSION: 1. Cluster of hypermetabolic right infrahilar lymph nodes. This activity is greater than reactive adenopathy. Consider granulomatous disease versus malignancy including lymphoma. Atypical pattern for both. 2. Small mildly metabolic nodule along the  pleural surface of the right lower lobe favors a small site of infection or inflammation. 3. Long segments of abnormal metabolic activity involving the spinal cord previous of cervical spine and thoracic spine. This corresponds to the abnormal dural enhancement on comparison MRI . Presumably this relates to the hilar adenopathy which would favor granulomas disease or lymphoma rather than a primary CNS process).   Electronically Signed  By: Suzy Bouchard M.D.  On: 08/27/2014 11:11  Impression: 33 yo man with a complicated presentation who has right hilar and mediastinal adenopathy.  He needs a lymph node biopsy for diagnostic purposes. I think it is reasonable to try mediastinoscopy first as there is less pain with that approach and the subcarinal node should be accessible. However, the right hilar nodes which are more prominent are not accessible by mediastinoscopy and would require a VATS approach.  I recommended to Gregory Fuller that we proceed with Mediastinoscopy, possible right VATS for lymph node biopsy. I discussed the operation with him in detail. We discussed the intraoperative decision making. We discussed the need for general anesthesia, the general nature of the procedure, the incisions to be used, and the expected recovery. We  reviewed the indications, risks, benefits, and alternatives. He understands that this is a diagnostic and therapeutic procedure. He understands that the risks include, but are not limited to death, MI, DVT, PE, bleeding, possible need for transfusion, infection, pneumothorax, recurrent nerve injury leading to hoarseness.  He accepts the risks and wishes to proceed  Plan: Mediastinoscopy, possible right VATS for lymph node biopsy on Wednesday 10/13/2014

## 2014-10-08 NOTE — Telephone Encounter (Signed)
Patient calling asking to speak to MR. [phone]

## 2014-10-08 NOTE — Telephone Encounter (Signed)
Spoke with pt - requests to speak to Dr Chase Caller personally. Requests call back before 11:30 or after 4pm (pt is going to work) Call back # [phone]

## 2014-10-11 ENCOUNTER — Encounter (HOSPITAL_COMMUNITY)
Admission: RE | Admit: 2014-10-11 | Discharge: 2014-10-11 | Disposition: A | Discharge status: Home / Self Care | Payer: 59 | Source: Ambulatory Visit | Attending: Thoracic Surgery (Cardiothoracic Vascular Surgery) | Admitting: Thoracic Surgery (Cardiothoracic Vascular Surgery)

## 2014-10-11 ENCOUNTER — Ambulatory Visit (HOSPITAL_COMMUNITY)
Admission: RE | Admit: 2014-10-11 | Discharge: 2014-10-11 | Disposition: A | Discharge status: Home / Self Care | Payer: 59 | Source: Ambulatory Visit | Attending: Thoracic Surgery (Cardiothoracic Vascular Surgery) | Admitting: Thoracic Surgery (Cardiothoracic Vascular Surgery)

## 2014-10-11 ENCOUNTER — Telehealth: Payer: Self-pay | Admitting: Internal Medicine

## 2014-10-11 ENCOUNTER — Telehealth: Payer: Self-pay | Admitting: Neurology

## 2014-10-11 ENCOUNTER — Encounter (HOSPITAL_COMMUNITY): Payer: Self-pay

## 2014-10-11 VITALS — BP 142/83 | HR 91 | Temp 99.4°F | Resp 16 | Ht 66.0 in | Wt 209.9 lb

## 2014-10-11 DIAGNOSIS — R0602 Shortness of breath: Secondary | ICD-10-CM | POA: Diagnosis not present

## 2014-10-11 DIAGNOSIS — R590 Localized enlarged lymph nodes: Secondary | ICD-10-CM

## 2014-10-11 DIAGNOSIS — J45909 Unspecified asthma, uncomplicated: Secondary | ICD-10-CM | POA: Diagnosis not present

## 2014-10-11 DIAGNOSIS — R200 Anesthesia of skin: Secondary | ICD-10-CM | POA: Diagnosis not present

## 2014-10-11 DIAGNOSIS — K219 Gastro-esophageal reflux disease without esophagitis: Secondary | ICD-10-CM | POA: Diagnosis not present

## 2014-10-11 DIAGNOSIS — Z87891 Personal history of nicotine dependence: Secondary | ICD-10-CM | POA: Diagnosis not present

## 2014-10-11 DIAGNOSIS — R61 Generalized hyperhidrosis: Secondary | ICD-10-CM | POA: Diagnosis not present

## 2014-10-11 DIAGNOSIS — Z885 Allergy status to narcotic agent status: Secondary | ICD-10-CM | POA: Diagnosis not present

## 2014-10-11 DIAGNOSIS — Z820 Family history of epilepsy and other diseases of the nervous system: Secondary | ICD-10-CM | POA: Diagnosis not present

## 2014-10-11 DIAGNOSIS — Z8349 Family history of other endocrine, nutritional and metabolic diseases: Secondary | ICD-10-CM | POA: Diagnosis not present

## 2014-10-11 DIAGNOSIS — Z6833 Body mass index (BMI) 33.0-33.9, adult: Secondary | ICD-10-CM | POA: Diagnosis not present

## 2014-10-11 DIAGNOSIS — R599 Enlarged lymph nodes, unspecified: Secondary | ICD-10-CM | POA: Diagnosis present

## 2014-10-11 DIAGNOSIS — M549 Dorsalgia, unspecified: Secondary | ICD-10-CM | POA: Diagnosis not present

## 2014-10-11 DIAGNOSIS — D861 Sarcoidosis of lymph nodes: Secondary | ICD-10-CM | POA: Diagnosis not present

## 2014-10-11 DIAGNOSIS — G373 Acute transverse myelitis in demyelinating disease of central nervous system: Secondary | ICD-10-CM | POA: Diagnosis not present

## 2014-10-11 DIAGNOSIS — Z833 Family history of diabetes mellitus: Secondary | ICD-10-CM | POA: Diagnosis not present

## 2014-10-11 DIAGNOSIS — R0789 Other chest pain: Secondary | ICD-10-CM | POA: Diagnosis not present

## 2014-10-11 HISTORY — DX: Gastro-esophageal reflux disease without esophagitis: K21.9

## 2014-10-11 LAB — COMPREHENSIVE METABOLIC PANEL
ALK PHOS: 55 U/L (ref 39–117)
ALT: 24 U/L (ref 0–53)
ANION GAP: 17 — AB (ref 5–15)
AST: 18 U/L (ref 0–37)
Albumin: 4.2 g/dL (ref 3.5–5.2)
BUN: 8 mg/dL (ref 6–23)
CO2: 21 meq/L (ref 19–32)
Calcium: 9.8 mg/dL (ref 8.4–10.5)
Chloride: 102 mEq/L (ref 96–112)
Creatinine, Ser: 0.86 mg/dL (ref 0.50–1.35)
Glucose, Bld: 95 mg/dL (ref 70–99)
POTASSIUM: 3.3 meq/L — AB (ref 3.7–5.3)
SODIUM: 140 meq/L (ref 137–147)
Total Bilirubin: 0.4 mg/dL (ref 0.3–1.2)
Total Protein: 7.5 g/dL (ref 6.0–8.3)

## 2014-10-11 LAB — URINALYSIS, ROUTINE W REFLEX MICROSCOPIC
Bilirubin Urine: NEGATIVE
Glucose, UA: NEGATIVE mg/dL
HGB URINE DIPSTICK: NEGATIVE
Ketones, ur: NEGATIVE mg/dL
LEUKOCYTES UA: NEGATIVE
Nitrite: NEGATIVE
PH: 5 (ref 5.0–8.0)
Protein, ur: NEGATIVE mg/dL
SPECIFIC GRAVITY, URINE: 1.011 (ref 1.005–1.030)
Urobilinogen, UA: 0.2 mg/dL (ref 0.0–1.0)

## 2014-10-11 LAB — CBC
HCT: 37.1 % — ABNORMAL LOW (ref 39.0–52.0)
Hemoglobin: 12.8 g/dL — ABNORMAL LOW (ref 13.0–17.0)
MCH: 27.1 pg (ref 26.0–34.0)
MCHC: 34.5 g/dL (ref 30.0–36.0)
MCV: 78.6 fL (ref 78.0–100.0)
Platelets: 182 10*3/uL (ref 150–400)
RBC: 4.72 MIL/uL (ref 4.22–5.81)
RDW: 14.6 % (ref 11.5–15.5)
WBC: 6.8 10*3/uL (ref 4.0–10.5)

## 2014-10-11 LAB — TYPE AND SCREEN
ABO/RH(D): O POS
Antibody Screen: NEGATIVE

## 2014-10-11 LAB — APTT: APTT: 31 s (ref 24–37)

## 2014-10-11 LAB — PROTIME-INR
INR: 1.02 (ref 0.00–1.49)
Prothrombin Time: 13.5 seconds (ref 11.6–15.2)

## 2014-10-11 LAB — ABO/RH: ABO/RH(D): O POS

## 2014-10-11 LAB — SURGICAL PCR SCREEN
MRSA, PCR: NEGATIVE
STAPHYLOCOCCUS AUREUS: NEGATIVE

## 2014-10-11 NOTE — Telephone Encounter (Signed)
Patient requesting a work note for all OV here with Dr. Jannifer Franklin.  Please call and advise.

## 2014-10-11 NOTE — Telephone Encounter (Signed)
Called and spoke with pt and he stated that he needs a work note for his job for his last [place] on 09/14/14 where he was seen by TP.  Please advise. If ok to do this.    Will forward this message to MR to make him aware that the pt will be having surgery on Wednesday 12/9 with Dr. Roxan Hockey.  Wanted to make MR aware.

## 2014-10-11 NOTE — Pre-Procedure Instructions (Addendum)
Gregory Fuller  10/11/2014   Your procedure is scheduled on:  10/13/14  Report to [facility] short stay admitting at 34 AM.  Call this number if you have problems the morning of surgery: [phone]   Remember:   Do not eat food or drink liquids after midnight.   Take these medicines the morning of surgery with A SIP OF WATER: inhaler(bring with you), gabapentin, protonix.prednisone  STOP all herbel meds, nsaids (aleve,naproxen,advil,ibuprofen) starting now including vitamins   Do not wear jewelry, make-up or nail polish.  Do not wear lotions, powders, or perfumes. You may wear deodorant.  Do not shave 48 hours prior to surgery. Men may shave face and neck.  Do not bring valuables to the hospital.  [facility] is not responsible                  for any belongings or valuables.               Contacts, dentures or bridgework may not be worn into surgery.  Leave suitcase in the car. After surgery it may be brought to your room.  For patients admitted to the hospital, discharge time is determined by your                treatment team.               Patients discharged the day of surgery will not be allowed to drive  home.  Name and phone number of your driver:   Special Instructions:  Special Instructions: [place] - Preparing for Surgery  Before surgery, you can play an important role.  Because skin is not sterile, your skin needs to be as free of germs as possible.  You can reduce the number of germs on you skin by washing with CHG (chlorahexidine gluconate) soap before surgery.  CHG is an antiseptic cleaner which kills germs and bonds with the skin to continue killing germs even after washing.  Please DO NOT use if you have an allergy to CHG or antibacterial soaps.  If your skin becomes reddened/irritated stop using the CHG and inform your nurse when you arrive at Short Stay.  Do not shave (including legs and underarms) for at least 48 hours prior to the first CHG shower.  You may  shave your face.  Please follow these instructions carefully:   1.  Shower with CHG Soap the night before surgery and the morning of Surgery.  2.  If you choose to wash your hair, wash your hair first as usual with your normal shampoo.  3.  After you shampoo, rinse your hair and body thoroughly to remove the Shampoo.  4.  Use CHG as you would any other liquid soap.  You can apply chg directly  to the skin and wash gently with scrungie or a clean washcloth.  5.  Apply the CHG Soap to your body ONLY FROM THE NECK DOWN.  Do not use on open wounds or open sores.  Avoid contact with your eyes ears, mouth and genitals (private parts).  Wash genitals (private parts)       with your normal soap.  6.  Wash thoroughly, paying special attention to the area where your surgery will be performed.  7.  Thoroughly rinse your body with warm water from the neck down.  8.  DO NOT shower/wash with your normal soap after using and rinsing off the CHG Soap.  9.  Pat yourself dry with a  clean towel.            10.  Wear clean pajamas.            11.  Place clean sheets on your bed the night of your first shower and do not sleep with pets.  Day of Surgery  Do not apply any lotions/deodorants the morning of surgery.  Please wear clean clothes to the hospital/surgery center.   Please read over the following fact sheets that you were given: Pain Booklet, Coughing and Deep Breathing, Blood Transfusion Information, MRSA Information and Surgical Site Infection Prevention

## 2014-10-12 ENCOUNTER — Encounter: Payer: Self-pay | Admitting: Neurology

## 2014-10-12 ENCOUNTER — Encounter: Payer: 59 | Admitting: Thoracic Surgery (Cardiothoracic Vascular Surgery)

## 2014-10-12 MED ORDER — DEXTROSE 5 % IV SOLN
1.5000 g | INTRAVENOUS | Status: AC
  Administered 2014-10-13: 1.5 g via INTRAVENOUS
  Filled 2014-10-12: qty 1.5

## 2014-10-12 NOTE — Telephone Encounter (Signed)
I called the patient to find out what exactly he needed in the note. I will call back later.

## 2014-10-12 NOTE — Telephone Encounter (Signed)
Please do work note  Please let him know a) he is in great hands with Dr Roxan Hockey; b) Dr Roxan Hockey will send me his progress always and c) I am doing night shift (see other message) and he can call me at those times at [phone] to d/w me

## 2014-10-12 NOTE — Telephone Encounter (Signed)
Due to my night shift I cannot call him. Best He can call me  [phone]  23.15 to- 23.45 -> Tuesday 10/12/14,  Wed 10/13/14  3pm - 23.00h -> Thursday 10/14/14 and Friday 10/15/14

## 2014-10-12 NOTE — Telephone Encounter (Signed)
I called the patient. He needs a note indicating when he was in the office for a visual evoked response test, and a new patient visit. I will generate the note.

## 2014-10-12 NOTE — Telephone Encounter (Signed)
Pt aware of below- given number to reach MR and specific times. Work note written and ready to be picked up.  Nothing further needed.

## 2014-10-12 NOTE — Telephone Encounter (Signed)
Patient had a VRE on 08-23-14 and a new patient visit on 08-17-14.

## 2014-10-12 NOTE — Telephone Encounter (Signed)
lmtcb for pt.  

## 2014-10-13 ENCOUNTER — Inpatient Hospital Stay (HOSPITAL_COMMUNITY): Payer: 59 | Admitting: Certified Registered Nurse Anesthetist

## 2014-10-13 ENCOUNTER — Ambulatory Visit (HOSPITAL_COMMUNITY)
Admission: RE | Admit: 2014-10-13 | Discharge: 2014-10-13 | Disposition: A | Discharge status: Home / Self Care | Payer: 59 | Source: Ambulatory Visit | Attending: Thoracic Surgery (Cardiothoracic Vascular Surgery) | Admitting: Thoracic Surgery (Cardiothoracic Vascular Surgery)

## 2014-10-13 ENCOUNTER — Ambulatory Visit: Payer: 59 | Admitting: Adult Health

## 2014-10-13 ENCOUNTER — Encounter (HOSPITAL_COMMUNITY): Payer: Self-pay | Admitting: Certified Registered Nurse Anesthetist

## 2014-10-13 ENCOUNTER — Encounter (HOSPITAL_COMMUNITY)
Admission: RE | Disposition: A | Discharge status: Home / Self Care | Payer: Self-pay | Source: Ambulatory Visit | Attending: Thoracic Surgery (Cardiothoracic Vascular Surgery)

## 2014-10-13 ENCOUNTER — Telehealth: Payer: Self-pay | Admitting: Internal Medicine

## 2014-10-13 DIAGNOSIS — R0602 Shortness of breath: Secondary | ICD-10-CM | POA: Insufficient documentation

## 2014-10-13 DIAGNOSIS — Z6833 Body mass index (BMI) 33.0-33.9, adult: Secondary | ICD-10-CM | POA: Insufficient documentation

## 2014-10-13 DIAGNOSIS — J45909 Unspecified asthma, uncomplicated: Secondary | ICD-10-CM | POA: Insufficient documentation

## 2014-10-13 DIAGNOSIS — R0789 Other chest pain: Secondary | ICD-10-CM | POA: Insufficient documentation

## 2014-10-13 DIAGNOSIS — R200 Anesthesia of skin: Secondary | ICD-10-CM | POA: Insufficient documentation

## 2014-10-13 DIAGNOSIS — G373 Acute transverse myelitis in demyelinating disease of central nervous system: Secondary | ICD-10-CM | POA: Insufficient documentation

## 2014-10-13 DIAGNOSIS — R61 Generalized hyperhidrosis: Secondary | ICD-10-CM | POA: Insufficient documentation

## 2014-10-13 DIAGNOSIS — Z833 Family history of diabetes mellitus: Secondary | ICD-10-CM | POA: Insufficient documentation

## 2014-10-13 DIAGNOSIS — K219 Gastro-esophageal reflux disease without esophagitis: Secondary | ICD-10-CM | POA: Insufficient documentation

## 2014-10-13 DIAGNOSIS — R590 Localized enlarged lymph nodes: Secondary | ICD-10-CM

## 2014-10-13 DIAGNOSIS — D861 Sarcoidosis of lymph nodes: Secondary | ICD-10-CM | POA: Diagnosis not present

## 2014-10-13 DIAGNOSIS — M549 Dorsalgia, unspecified: Secondary | ICD-10-CM | POA: Insufficient documentation

## 2014-10-13 DIAGNOSIS — Z8349 Family history of other endocrine, nutritional and metabolic diseases: Secondary | ICD-10-CM | POA: Insufficient documentation

## 2014-10-13 DIAGNOSIS — Z87891 Personal history of nicotine dependence: Secondary | ICD-10-CM | POA: Insufficient documentation

## 2014-10-13 DIAGNOSIS — Z820 Family history of epilepsy and other diseases of the nervous system: Secondary | ICD-10-CM | POA: Insufficient documentation

## 2014-10-13 DIAGNOSIS — Z885 Allergy status to narcotic agent status: Secondary | ICD-10-CM | POA: Insufficient documentation

## 2014-10-13 HISTORY — PX: LYMPH NODE BIOPSY: SHX201

## 2014-10-13 HISTORY — PX: MEDIASTINOSCOPY: SHX5086

## 2014-10-13 LAB — BLOOD GAS, ARTERIAL
Acid-base deficit: 0.3 mmol/L (ref 0.0–2.0)
Bicarbonate: 23.4 mEq/L (ref 20.0–24.0)
O2 Saturation: 98 %
PATIENT TEMPERATURE: 98.6
PH ART: 7.436 (ref 7.350–7.450)
TCO2: 24.5 mmol/L (ref 0–100)
pCO2 arterial: 35.4 mmHg (ref 35.0–45.0)
pO2, Arterial: 110 mmHg — ABNORMAL HIGH (ref 80.0–100.0)

## 2014-10-13 SURGERY — MEDIASTINOSCOPY
Anesthesia: General

## 2014-10-13 MED ORDER — PROPOFOL 10 MG/ML IV BOLUS
INTRAVENOUS | Status: AC
  Filled 2014-10-13: qty 20

## 2014-10-13 MED ORDER — MIDAZOLAM HCL 2 MG/2ML IJ SOLN
INTRAMUSCULAR | Status: AC
  Filled 2014-10-13: qty 2

## 2014-10-13 MED ORDER — HEMOSTATIC AGENTS (NO CHARGE) OPTIME
TOPICAL | Status: DC | PRN
  Administered 2014-10-13: 1 via TOPICAL

## 2014-10-13 MED ORDER — FENTANYL CITRATE 0.05 MG/ML IJ SOLN
INTRAMUSCULAR | Status: DC | PRN
  Administered 2014-10-13: 50 ug via INTRAVENOUS
  Administered 2014-10-13 (×2): 100 ug via INTRAVENOUS
  Administered 2014-10-13: 50 ug via INTRAVENOUS

## 2014-10-13 MED ORDER — LACTATED RINGERS IV SOLN
INTRAVENOUS | Status: DC | PRN
  Administered 2014-10-13: 08:00:00 via INTRAVENOUS

## 2014-10-13 MED ORDER — ESMOLOL HCL 10 MG/ML IV SOLN
INTRAVENOUS | Status: DC | PRN
  Administered 2014-10-13: 40 mg via INTRAVENOUS

## 2014-10-13 MED ORDER — STERILE WATER FOR INJECTION IJ SOLN
INTRAMUSCULAR | Status: AC
  Filled 2014-10-13: qty 10

## 2014-10-13 MED ORDER — ROCURONIUM BROMIDE 50 MG/5ML IV SOLN
INTRAVENOUS | Status: AC
  Filled 2014-10-13: qty 1

## 2014-10-13 MED ORDER — FENTANYL CITRATE 0.05 MG/ML IJ SOLN
INTRAMUSCULAR | Status: AC
  Filled 2014-10-13: qty 5

## 2014-10-13 MED ORDER — HYDROMORPHONE HCL 1 MG/ML IJ SOLN
INTRAMUSCULAR | Status: AC
  Filled 2014-10-13: qty 1

## 2014-10-13 MED ORDER — OXYCODONE HCL 5 MG/5ML PO SOLN: 5.0000 mg | Freq: Once | ORAL | Status: DC | PRN

## 2014-10-13 MED ORDER — GLYCOPYRROLATE 0.2 MG/ML IJ SOLN
INTRAMUSCULAR | Status: DC | PRN
  Administered 2014-10-13: 0.4 mg via INTRAVENOUS

## 2014-10-13 MED ORDER — TRAMADOL HCL 50 MG PO TABS: 50.0000 mg | ORAL_TABLET | Freq: Four times a day (QID) | ORAL | Status: DC | PRN

## 2014-10-13 MED ORDER — ONDANSETRON HCL 4 MG/2ML IJ SOLN
INTRAMUSCULAR | Status: AC
  Filled 2014-10-13: qty 2

## 2014-10-13 MED ORDER — 0.9 % SODIUM CHLORIDE (POUR BTL) OPTIME
TOPICAL | Status: DC | PRN
  Administered 2014-10-13: 2000 mL

## 2014-10-13 MED ORDER — MIDAZOLAM HCL 5 MG/5ML IJ SOLN
INTRAMUSCULAR | Status: DC | PRN
  Administered 2014-10-13: 2 mg via INTRAVENOUS

## 2014-10-13 MED ORDER — ARTIFICIAL TEARS OP OINT
TOPICAL_OINTMENT | OPHTHALMIC | Status: DC | PRN
  Administered 2014-10-13: 1 via OPHTHALMIC

## 2014-10-13 MED ORDER — ROCURONIUM BROMIDE 100 MG/10ML IV SOLN
INTRAVENOUS | Status: DC | PRN
  Administered 2014-10-13: 50 mg via INTRAVENOUS

## 2014-10-13 MED ORDER — EPHEDRINE SULFATE 50 MG/ML IJ SOLN
INTRAMUSCULAR | Status: AC
  Filled 2014-10-13: qty 1

## 2014-10-13 MED ORDER — NEOSTIGMINE METHYLSULFATE 10 MG/10ML IV SOLN
INTRAVENOUS | Status: DC | PRN
  Administered 2014-10-13: 4 mg via INTRAVENOUS

## 2014-10-13 MED ORDER — PROPOFOL 10 MG/ML IV BOLUS
INTRAVENOUS | Status: DC | PRN
  Administered 2014-10-13: 180 mg via INTRAVENOUS
  Administered 2014-10-13: 160 mg via INTRAVENOUS

## 2014-10-13 MED ORDER — LIDOCAINE HCL (CARDIAC) 20 MG/ML IV SOLN
INTRAVENOUS | Status: DC | PRN
  Administered 2014-10-13: 60 mg via INTRAVENOUS

## 2014-10-13 MED ORDER — LACTATED RINGERS IV SOLN
INTRAVENOUS | Status: DC | PRN
  Administered 2014-10-13: 09:00:00 via INTRAVENOUS

## 2014-10-13 MED ORDER — PROMETHAZINE HCL 25 MG/ML IJ SOLN: 6.2500 mg | INTRAMUSCULAR | Status: DC | PRN

## 2014-10-13 MED ORDER — VECURONIUM BROMIDE 10 MG IV SOLR
INTRAVENOUS | Status: AC
Start: 2014-10-13 — End: 2014-10-13
  Filled 2014-10-13: qty 10

## 2014-10-13 MED ORDER — SUCCINYLCHOLINE CHLORIDE 20 MG/ML IJ SOLN
INTRAMUSCULAR | Status: AC
  Filled 2014-10-13: qty 1

## 2014-10-13 MED ORDER — ARTIFICIAL TEARS OP OINT
TOPICAL_OINTMENT | OPHTHALMIC | Status: AC
  Filled 2014-10-13: qty 3.5

## 2014-10-13 MED ORDER — VECURONIUM BROMIDE 10 MG IV SOLR
INTRAVENOUS | Status: DC | PRN
  Administered 2014-10-13: 1 mg via INTRAVENOUS

## 2014-10-13 MED ORDER — HYDROMORPHONE HCL 1 MG/ML IJ SOLN
0.2500 mg | INTRAMUSCULAR | Status: DC | PRN
  Administered 2014-10-13: 0.5 mg via INTRAVENOUS

## 2014-10-13 MED ORDER — LIDOCAINE HCL (CARDIAC) 20 MG/ML IV SOLN
INTRAVENOUS | Status: AC
  Filled 2014-10-13: qty 5

## 2014-10-13 MED ORDER — ONDANSETRON HCL 4 MG/2ML IJ SOLN
INTRAMUSCULAR | Status: DC | PRN
  Administered 2014-10-13: 4 mg via INTRAVENOUS

## 2014-10-13 MED ORDER — OXYCODONE HCL 5 MG PO TABS: 5.0000 mg | ORAL_TABLET | Freq: Once | ORAL | Status: DC | PRN

## 2014-10-13 SURGICAL SUPPLY — 90 items
ADH SKN CLS APL DERMABOND .7 (GAUZE/BANDAGES/DRESSINGS) ×2
ADH SKN CLS LQ APL DERMABOND (GAUZE/BANDAGES/DRESSINGS) ×2
APPLIER CLIP LOGIC TI 5 (MISCELLANEOUS) IMPLANT
APPLIER CLIP ROT 10 11.4 M/L (STAPLE)
APR CLP MED LRG 11.4X10 (STAPLE)
APR CLP MED LRG 33X5 (MISCELLANEOUS)
BAG SPEC RTRVL LRG 6X4 10 (ENDOMECHANICALS)
BLADE SURG 15 STRL LF DISP TIS (BLADE) ×1 IMPLANT
BLADE SURG 15 STRL SS (BLADE) ×3
CANISTER SUCTION 2500CC (MISCELLANEOUS) ×3 IMPLANT
CATH KIT ON Q 5IN SLV (PAIN MANAGEMENT) IMPLANT
CATH THORACIC 28FR (CATHETERS) IMPLANT
CATH THORACIC 28FR RT ANG (CATHETERS) IMPLANT
CATH THORACIC 36FR (CATHETERS) IMPLANT
CATH THORACIC 36FR RT ANG (CATHETERS) IMPLANT
CLIP APPLIE ROT 10 11.4 M/L (STAPLE) IMPLANT
CLIP TI MEDIUM 24 (CLIP) ×2 IMPLANT
CLIP TI MEDIUM 6 (CLIP) IMPLANT
CONN Y 3/8X3/8X3/8  BEN (MISCELLANEOUS)
CONN Y 3/8X3/8X3/8 BEN (MISCELLANEOUS) ×1 IMPLANT
CONT SPEC 4OZ CLIKSEAL STRL BL (MISCELLANEOUS) ×16 IMPLANT
COVER SURGICAL LIGHT HANDLE (MISCELLANEOUS) ×4 IMPLANT
DERMABOND ADHESIVE PROPEN (GAUZE/BANDAGES/DRESSINGS) ×1
DERMABOND ADVANCED (GAUZE/BANDAGES/DRESSINGS) ×1
DERMABOND ADVANCED .7 DNX12 (GAUZE/BANDAGES/DRESSINGS) ×2 IMPLANT
DERMABOND ADVANCED .7 DNX6 (GAUZE/BANDAGES/DRESSINGS) ×1 IMPLANT
DRAPE CHEST BREAST 15X10 FENES (DRAPES) ×3 IMPLANT
DRAPE LAPAROSCOPIC ABDOMINAL (DRAPES) ×1 IMPLANT
DRAPE SLUSH/WARMER DISC (DRAPES) ×2 IMPLANT
DRAPE WARM FLUID 44X44 (DRAPE) ×1 IMPLANT
ELECT REM PT RETURN 9FT ADLT (ELECTROSURGICAL) ×3
ELECTRODE REM PT RTRN 9FT ADLT (ELECTROSURGICAL) ×2 IMPLANT
GAUZE SPONGE 4X4 12PLY STRL (GAUZE/BANDAGES/DRESSINGS) ×1 IMPLANT
GAUZE SPONGE 4X4 16PLY XRAY LF (GAUZE/BANDAGES/DRESSINGS) ×3 IMPLANT
GLOVE SURG SIGNA 7.5 PF LTX (GLOVE) ×4 IMPLANT
GOWN STRL REUS W/ TWL LRG LVL3 (GOWN DISPOSABLE) ×3 IMPLANT
GOWN STRL REUS W/ TWL XL LVL3 (GOWN DISPOSABLE) ×2 IMPLANT
GOWN STRL REUS W/TWL LRG LVL3 (GOWN DISPOSABLE) ×3
GOWN STRL REUS W/TWL XL LVL3 (GOWN DISPOSABLE) ×3
HEMOSTAT SURGICEL 2X14 (HEMOSTASIS) ×2 IMPLANT
KIT BASIN OR (CUSTOM PROCEDURE TRAY) ×3 IMPLANT
KIT ROOM TURNOVER OR (KITS) ×3 IMPLANT
KIT SUCTION CATH 14FR (SUCTIONS) ×1 IMPLANT
NS IRRIG 1000ML POUR BTL (IV SOLUTION) ×6 IMPLANT
PACK CHEST (CUSTOM PROCEDURE TRAY) ×3 IMPLANT
PACK SURGICAL SETUP 50X90 (CUSTOM PROCEDURE TRAY) ×3 IMPLANT
PAD ARMBOARD 7.5X6 YLW CONV (MISCELLANEOUS) ×4 IMPLANT
PENCIL BUTTON HOLSTER BLD 10FT (ELECTRODE) ×3 IMPLANT
POUCH ENDO CATCH II 15MM (MISCELLANEOUS) IMPLANT
POUCH SPECIMEN RETRIEVAL 10MM (ENDOMECHANICALS) IMPLANT
SEALANT PROGEL (MISCELLANEOUS) IMPLANT
SEALANT SURG COSEAL 4ML (VASCULAR PRODUCTS) IMPLANT
SEALANT SURG COSEAL 8ML (VASCULAR PRODUCTS) IMPLANT
SOLUTION ANTI FOG 6CC (MISCELLANEOUS) ×1 IMPLANT
SPECIMEN JAR MEDIUM (MISCELLANEOUS) ×1 IMPLANT
SPONGE INTESTINAL PEANUT (DISPOSABLE) ×2 IMPLANT
SUT PROLENE 4 0 RB 1 (SUTURE)
SUT PROLENE 4-0 RB1 .5 CRCL 36 (SUTURE) IMPLANT
SUT SILK  1 MH (SUTURE)
SUT SILK 1 MH (SUTURE) ×2 IMPLANT
SUT SILK 2 0 TIES 10X30 (SUTURE) ×2 IMPLANT
SUT SILK 2 0SH CR/8 30 (SUTURE) IMPLANT
SUT SILK 3 0SH CR/8 30 (SUTURE) IMPLANT
SUT VIC AB 1 CTX 36 (SUTURE)
SUT VIC AB 1 CTX36XBRD ANBCTR (SUTURE) IMPLANT
SUT VIC AB 2-0 CT1 27 (SUTURE)
SUT VIC AB 2-0 CT1 TAPERPNT 27 (SUTURE) IMPLANT
SUT VIC AB 2-0 CTX 36 (SUTURE) IMPLANT
SUT VIC AB 2-0 UR6 27 (SUTURE) IMPLANT
SUT VIC AB 3-0 MH 27 (SUTURE) IMPLANT
SUT VIC AB 3-0 SH 18 (SUTURE) IMPLANT
SUT VIC AB 3-0 SH 27 (SUTURE) ×3
SUT VIC AB 3-0 SH 27X BRD (SUTURE) ×2 IMPLANT
SUT VIC AB 3-0 X1 27 (SUTURE) ×1 IMPLANT
SUT VICRYL 2 TP 1 (SUTURE) IMPLANT
SUT VICRYL 4-0 PS2 18IN ABS (SUTURE) ×3 IMPLANT
SWAB COLLECTION DEVICE MRSA (MISCELLANEOUS) IMPLANT
SYRINGE 10CC LL (SYRINGE) ×3 IMPLANT
SYSTEM SAHARA CHEST DRAIN ATS (WOUND CARE) ×1 IMPLANT
TAPE CLOTH 4X10 WHT NS (GAUZE/BANDAGES/DRESSINGS) ×1 IMPLANT
TIP APPLICATOR SPRAY EXTEND 16 (VASCULAR PRODUCTS) IMPLANT
TOWEL OR 17X24 6PK STRL BLUE (TOWEL DISPOSABLE) ×3 IMPLANT
TOWEL OR 17X26 10 PK STRL BLUE (TOWEL DISPOSABLE) ×6 IMPLANT
TRAP SPECIMEN MUCOUS 40CC (MISCELLANEOUS) IMPLANT
TRAY FOLEY CATH 16FRSI W/METER (SET/KITS/TRAYS/PACK) ×1 IMPLANT
TROCAR XCEL NON-BLD 5MMX100MML (ENDOMECHANICALS) IMPLANT
TUBE ANAEROBIC SPECIMEN COL (MISCELLANEOUS) IMPLANT
TUBE CONNECTING 12X1/4 (SUCTIONS) ×1 IMPLANT
TUNNELER SHEATH ON-Q 11GX8 DSP (PAIN MANAGEMENT) IMPLANT
WATER STERILE IRR 1000ML POUR (IV SOLUTION) ×6 IMPLANT

## 2014-10-13 NOTE — H&P (View-Only) (Signed)
PCP is Nilda Simmer, MD Referring Provider is Parrett, Fonnie Mu, NP  Chief Complaint  Patient presents with  . Adenopathy    Surgical eval, Chest CT 08/05/2014...right hilar adenopathy    HPI: 33 yo man with subcarinal and right hilar adenopathy.  Mr. Gregory Fuller is a 33 yo AAM with known asthma, occasional tobacco use, and recreational marijuana use. He was in his usual state of health until August when he fell in the tub and struck his back/neck. There was no loss of consciousness . His neck/back pain was treated with steroids per urgent care. He improved for a time but symptoms returned in October. He went to the ED and was evaluated by Neurology in ED. MRI of brain and spine was concerning for myelitis which was treated with steroids and he was admitted for further evaluation, including a LP. He also had a CT of the chest which showed right hilar +/- mediastinal adenopathy and a small lung nodule.  A PET showed the nodes to be hypermetabolic.  He had an EBUS which was nondiagnostic.   He still does not have a definitive diagnosis.   Past Medical History  Diagnosis Date  . Asthma   . Subacute transverse myelitis   . Bronchitis     Past Surgical History  Procedure Laterality Date  . No past surgeries    . Video bronchoscopy with endobronchial ultrasound N/A 09/08/2014    Procedure: VIDEO BRONCHOSCOPY WITH ENDOBRONCHIAL ULTRASOUND;  Surgeon: Brand Males, MD;  Location: [facility] OR;  Service: Thoracic;  Laterality: N/A;    Family History  Problem Relation Age of Onset  . Diabetes Other   . Thyroid disease Mother   . Seizures Father     Social History History  Substance Use Topics  . Smoking status: Never Smoker   . Smokeless tobacco: Never Used     Comment: pt states he smoked socially  . Alcohol Use: Yes     Comment: occassional weekends    Current Outpatient Prescriptions  Medication Sig Dispense Refill  . acetaminophen (TYLENOL) 500 MG tablet Take 750 mg by mouth every 6  (six) hours as needed for mild pain.     Marland Kitchen albuterol (PROVENTIL HFA;VENTOLIN HFA) 108 (90 BASE) MCG/ACT inhaler Inhale 2 puffs into the lungs every 6 (six) hours as needed for wheezing or shortness of breath. 1 Inhaler 6  . dextromethorphan-guaiFENesin (MUCINEX DM) 30-600 MG per 12 hr tablet Take 1 tablet by mouth 2 (two) times daily as needed for cough.    . gabapentin (NEURONTIN) 300 MG capsule Take 1 capsule (300 mg total) by mouth 2 (two) times daily. 60 capsule 3  . Multiple Vitamin (ONE-A-DAY MENS PO) Take 1 tablet by mouth daily.    . pantoprazole (PROTONIX) 40 MG tablet Take 1 tablet (40 mg total) by mouth daily. 60 tablet 0   No current facility-administered medications for this visit.    Allergies  Allergen Reactions  . Vicodin [Hydrocodone-Acetaminophen] Other (See Comments)    Made patient feel like he was in more pain    Review of Systems  Constitutional: Positive for diaphoresis. Negative for fever and chills.  Respiratory: Positive for chest tightness and wheezing (occassional).   Cardiovascular: Positive for chest pain (left sided).  Musculoskeletal: Positive for back pain.  Neurological: Positive for numbness (and tingling B LE).  Hematological: Negative for adenopathy.  All other systems reviewed and are negative.   BP 146/87 mmHg  Pulse 77  Resp 16  Ht 5\' 6"  (1.676 m)  Wt 211 lb 8 oz (95.936 kg)  BMI 34.15 kg/m2  SpO2 99% Physical Exam  Constitutional: He is oriented to person, place, and time. He appears well-developed and well-nourished. No distress.  Mildly cushingoid  HENT:  Head: Normocephalic and atraumatic.  Eyes: EOM are normal. Pupils are equal, round, and reactive to light.  Neck: Neck supple.  Cardiovascular: Normal rate, regular rhythm, normal heart sounds and intact distal pulses.   No murmur heard. Pulmonary/Chest: Effort normal and breath sounds normal. He has no wheezes.  Abdominal: Soft. There is no tenderness.  Musculoskeletal: He  exhibits no edema.  Lymphadenopathy:    He has no cervical adenopathy.  Neurological: He is alert and oriented to person, place, and time. No cranial nerve deficit.  Skin: Skin is warm and dry.  Vitals reviewed.    Diagnostic Tests: NUCLEAR MEDICINE PET SKULL BASE TO THIGH  TECHNIQUE: 11.1 mCi F-18 FDG was injected intravenously. Full-ring PET imaging was performed from the skull base to thigh after the radiotracer. CT data was obtained and used for attenuation correction and anatomic localization.  FASTING BLOOD GLUCOSE: Value: 104 mg/dl  COMPARISON: CT 08/05/2014  FINDINGS: NECK  No hypermetabolic lymph nodes in the neck.  CHEST  Cluster of right infrahilar lymph nodes described on comparison CT are hypermetabolic with SUV max equal 14.6. The lymph node volumes are difficult to measure on this noncontrast CT. There are approximately 3 hypermetabolic lymph nodes. Additional small hypermetabolic subcarinal lymph node also noted along the right bronchus intermedius on image 70.  Review of the lung parenchyma demonstrates a nodule in the posterior medial right lower lobe against the pleural surface measuring 8 mm on image 46, series 6. This nodule is mildly hypermetabolic with SUV max of 3.2  There is long segments of abnormal hypermetabolic activity associated with the spinal cord of the cervical spine and thoracic spine down to the T12 level.  ABDOMEN/PELVIS  No abnormal hypermetabolic activity within the liver, pancreas, adrenal glands, or spleen. No hypermetabolic lymph nodes in the abdomen or pelvis.  SKELETON  No focal hypermetabolic activity to suggest skeletal metastasis.  IMPRESSION: 1. Cluster of hypermetabolic right infrahilar lymph nodes. This activity is greater than reactive adenopathy. Consider granulomatous disease versus malignancy including lymphoma. Atypical pattern for both. 2. Small mildly metabolic nodule along the  pleural surface of the right lower lobe favors a small site of infection or inflammation. 3. Long segments of abnormal metabolic activity involving the spinal cord previous of cervical spine and thoracic spine. This corresponds to the abnormal dural enhancement on comparison MRI . Presumably this relates to the hilar adenopathy which would favor granulomas disease or lymphoma rather than a primary CNS process).   Electronically Signed  By: Suzy Bouchard M.D.  On: 08/27/2014 11:11  Impression: 33 yo man with a complicated presentation who has right hilar and mediastinal adenopathy.  He needs a lymph node biopsy for diagnostic purposes. I think it is reasonable to try mediastinoscopy first as there is less pain with that approach and the subcarinal node should be accessible. However, the right hilar nodes which are more prominent are not accessible by mediastinoscopy and would require a VATS approach.  I recommended to Mr. Borre that we proceed with Mediastinoscopy, possible right VATS for lymph node biopsy. I discussed the operation with him in detail. We discussed the intraoperative decision making. We discussed the need for general anesthesia, the general nature of the procedure, the incisions to be used, and the expected recovery. We  reviewed the indications, risks, benefits, and alternatives. He understands that this is a diagnostic and therapeutic procedure. He understands that the risks include, but are not limited to death, MI, DVT, PE, bleeding, possible need for transfusion, infection, pneumothorax, recurrent nerve injury leading to hoarseness.  He accepts the risks and wishes to proceed  Plan: Mediastinoscopy, possible right VATS for lymph node biopsy on Wednesday 10/13/2014

## 2014-10-13 NOTE — Telephone Encounter (Signed)
Left message that the letter is at the front desk for pick up.

## 2014-10-13 NOTE — Anesthesia Procedure Notes (Addendum)
Procedure Name: Intubation Date/Time: 10/13/2014 8:39 AM Performed by: Garrison Columbus T Pre-anesthesia Checklist: Patient identified, Emergency Drugs available, Suction available and Patient being monitored Patient Re-evaluated:Patient Re-evaluated prior to inductionOxygen Delivery Method: Circle system utilized Preoxygenation: Pre-oxygenation with 100% oxygen Intubation Type: IV induction Ventilation: Mask ventilation without difficulty Laryngoscope Size: Mac and 4 Grade View: Grade II Tube type: Oral Tube size: 7.5 mm Number of attempts: 1 Airway Equipment and Method: Stylet Placement Confirmation: ETT inserted through vocal cords under direct vision,  positive ETCO2 and breath sounds checked- equal and bilateral Secured at: 22 cm Tube secured with: Tape Dental Injury: Teeth and Oropharynx as per pre-operative assessment

## 2014-10-13 NOTE — Anesthesia Preprocedure Evaluation (Addendum)
Anesthesia Evaluation  Patient identified by MRN, date of birth, ID band Patient awake    Reviewed: Allergy & Precautions, H&P , NPO status , Patient's Chart, lab work & pertinent test results  Airway Mallampati: II  TM Distance: >3 FB Neck ROM: Full    Dental  (+) Dental Advisory Given, Teeth Intact   Pulmonary shortness of breath, asthma ,  breath sounds clear to auscultation        Cardiovascular Rhythm:Regular Rate:Normal     Neuro/Psych    GI/Hepatic GERD-  Medicated,  Endo/Other  Morbid obesity  Renal/GU      Musculoskeletal   Abdominal (+) + obese,   Peds  Hematology   Anesthesia Other Findings   Reproductive/Obstetrics                          Anesthesia Physical Anesthesia Plan  ASA: II  Anesthesia Plan: General   Post-op Pain Management:    Induction: Intravenous  Airway Management Planned: Double Lumen EBT  Additional Equipment: Arterial line  Intra-op Plan:   Post-operative Plan: Extubation in OR  Informed Consent: I have reviewed the patients History and Physical, chart, labs and discussed the procedure including the risks, benefits and alternatives for the proposed anesthesia with the patient or authorized representative who has indicated his/her understanding and acceptance.   Dental advisory given  Plan Discussed with: CRNA, Anesthesiologist and Surgeon  Anesthesia Plan Comments:        Anesthesia Quick Evaluation

## 2014-10-13 NOTE — Anesthesia Postprocedure Evaluation (Signed)
  Anesthesia Post-op Note  Patient: Gregory Fuller  Procedure(s) Performed: Procedure(s): MEDIASTINOSCOPY (N/A) LYMPH NODE BIOPSY (N/A)  Patient Location: PACU  Anesthesia Type:General  Level of Consciousness: awake, alert  and sedated  Airway and Oxygen Therapy: Patient Spontanous Breathing  Post-op Pain: mild  Post-op Assessment: Post-op Vital signs reviewed and Patient's Cardiovascular Status Stable  Post-op Vital Signs: stable  Last Vitals:  Filed Vitals:   10/13/14 1115  BP: 113/64  Pulse: 67  Temp:   Resp: 17    Complications: No apparent anesthesia complications

## 2014-10-13 NOTE — Addendum Note (Signed)
Addendum  created 10/13/14 1220 by Harden Mo, CRNA   Modules edited: Charges VN

## 2014-10-13 NOTE — Progress Notes (Signed)
Aline d/c as ordered pressure heltx14minutes and pressure bandage applied no hematoma noted

## 2014-10-13 NOTE — Discharge Instructions (Addendum)
Do not drive or engage in heavy physical activity for 24 hours  You may resume normal activities tomorrow  You may shower tomorrow  There is a medical adhesive on the incision. It will begin to peel off in 7- 10 days  You have a prescription for a pain medication- tramadol. It is a mild narcotic- do not drive while taking it. You may use acetaminophen or ibuprofen in addition to or instead of the tramadol  Call [phone] if you develop chest pain, shortness of breath, fever > 101 F, or notice redness or drainage from the incision.  My office and Dr. Golden Pop office will contact you for follow up  What to eat:  For your first meals, you should eat lightly; only small meals initially.  If you do not have nausea, you may eat larger meals.  Avoid spicy, greasy and heavy food.    General Anesthesia, Adult, Care After  Refer to this sheet in the next few weeks. These instructions provide you with information on caring for yourself after your procedure. Your health care provider may also give you more specific instructions. Your treatment has been planned according to current medical practices, but problems sometimes occur. Call your health care provider if you have any problems or questions after your procedure.  WHAT TO EXPECT AFTER THE PROCEDURE  After the procedure, it is typical to experience:  Sleepiness.  Nausea and vomiting. HOME CARE INSTRUCTIONS  For the first 24 hours after general anesthesia:  Have a responsible person with you.  Do not drive a car. If you are alone, do not take public transportation.  Do not drink alcohol.  Do not take medicine that has not been prescribed by your health care provider.  Do not sign important papers or make important decisions.  You may resume a normal diet and activities as directed by your health care provider.  Change bandages (dressings) as directed.  If you have questions or problems that seem related to general anesthesia, call the  hospital and ask for the anesthetist or anesthesiologist on call. SEEK MEDICAL CARE IF:  You have nausea and vomiting that continue the day after anesthesia.  You develop a rash. SEEK IMMEDIATE MEDICAL CARE IF:  You have difficulty breathing.  You have chest pain.  You have any allergic problems. Document Released: 01/28/2001 Document Revised: 06/24/2013 Document Reviewed: 05/07/2013  [facility] Patient Information 2014 [place], [place].  Tissue Adhesive Wound Care    Some cuts and wounds can be closed with tissue adhesive. Adhesive is like glue. It holds the skin together and helps a wound heal faster. This adhesive goes away on its own as the wound heals.  HOME CARE  Showers are allowed. Do not soak the wound in water. Do not take baths, swim, or use hot tubs. Do not use soaps or creams on your wound.  If a bandage (dressing) was put on, change it as often as told by your doctor.  Keep the bandage dry.  Do not scratch, pick, or rub the adhesive.  Do not put tape over the adhesive. The adhesive could come off.  Protect the wound from another injury.  Protect the wound from sun and tanning beds.  Only take medicine as told by your doctor.  Keep all doctor visits as told. GET HELP RIGHT AWAY IF:  Your wound is red, puffy (swollen), hot, or tender.  You get a rash after the glue is put on.  You have more pain in the wound.  You have a red streak going away from the wound.  You have yellowish-white fluid (pus) coming from the wound.  You have more bleeding.  You have a fever.  You have chills and start to shake.  You notice a bad smell coming from the wound.  Your wound or adhesive breaks open. MAKE SURE YOU:  Understand these instructions.  Will watch your condition.  Will get help right away if you are not doing well or get worse. Document Released: 07/31/2008 Document Revised: 08/12/2013 Document Reviewed: 05/13/2013  [facility] Patient Information 2015 [place], [place]. This  information is not intended to replace advice given to you by your health care provider. Make sure you discuss any questions you have with your health care provider.

## 2014-10-13 NOTE — Transfer of Care (Signed)
Immediate Anesthesia Transfer of Care Note  Patient: Gregory Fuller  Procedure(s) Performed: Procedure(s): MEDIASTINOSCOPY (N/A) LYMPH NODE BIOPSY (N/A)  Patient Location: PACU  Anesthesia Type:General  Level of Consciousness: awake, alert  and oriented  Airway & Oxygen Therapy: Patient Spontanous Breathing and Patient connected to nasal cannula oxygen  Post-op Assessment: Report given to PACU RN, Post -op Vital signs reviewed and stable and Patient moving all extremities X 4  Post vital signs: Reviewed and stable  Complications: No apparent anesthesia complications

## 2014-10-13 NOTE — Interval H&P Note (Signed)
History and Physical Interval Note:  10/13/2014 8:01 AM  Gregory Fuller  has presented today for surgery, with the diagnosis of MEDIASTINAL ADENOPATHY RIGHT HILAR ADENOPATHY  The various methods of treatment have been discussed with the patient and family. After consideration of risks, benefits and other options for treatment, the patient has consented to  Procedure(s): MEDIASTINOSCOPY (N/A) VIDEO ASSISTED THORACOSCOPY (Right) LYMPH NODE BIOPSY (N/A) as a surgical intervention .  The patient's history has been reviewed, patient examined, no change in status, stable for surgery.  I have reviewed the patient's chart and labs.  Questions were answered to the patient's satisfaction.     [name] C

## 2014-10-13 NOTE — Progress Notes (Signed)
r central line dc as ordered pressure applied x5 minutes and pressure bandage applied no hematoma applied

## 2014-10-13 NOTE — Telephone Encounter (Signed)
Gregory Fuller  Ensure he has fu with me first avail for his post mediastinascopy; shwos sarcoid  Dr. Brand Males, M.D., [facility].C.P Pulmonary and Critical Care Medicine Staff Physician [facility] Pager: [phone], If no answer or between  15:00h - 7:00h: call [phone]  10/13/2014 11:28 PM

## 2014-10-13 NOTE — Brief Op Note (Signed)
10/13/2014  9:57 AM  PATIENT:  Gregory Fuller  33 y.o. male  PRE-OPERATIVE DIAGNOSIS:  MEDIASTINAL AND RIGHT HILAR ADENOPATHY  POST-OPERATIVE DIAGNOSIS:  MEDIASTINAL AND RIGHT HILAR ADENOPATHY- GRANULOMATOUS DISEASE, PROBABLE SARCOID  PROCEDURE:  Procedure(s): MEDIASTINOSCOPY (N/A) LYMPH NODE BIOPSY (N/A)  SURGEON:  Surgeon(s) and Role:    * Melrose Nakayama, MD - Primary  ANESTHESIA:   general  EBL:  Total I/O In: 1100 [I.V.:1100] Out: 300 [Urine:300]  BLOOD ADMINISTERED:none  DRAINS: none   LOCAL MEDICATIONS USED:  NONE  SPECIMEN:  Source of Specimen:  4r, 7, 10R Lymph nodes  DISPOSITION OF SPECIMEN:  PATHOLOGY  COUNTS:  YES  PLAN OF CARE: Discharge to home after PACU  PATIENT DISPOSITION:  PACU - hemodynamically stable.   Delay start of Pharmacological VTE agent (>24hrs) due to surgical blood loss or risk of bleeding: not applicable  FINDINGS- frozen showed granulomatous inflammation c/w sarcoid

## 2014-10-14 NOTE — Op Note (Signed)
NAME:  Gregory Fuller, Gregory Fuller                ACCOUNT NO.:  [id number]  MEDICAL RECORD NO.:  [id number]  LOCATION:  [facility]                         FACILITY:  [facility]  PHYSICIAN:  Revonda Standard. Roxan Hockey, M.D.DATE OF BIRTH:  25-Oct-1981  DATE OF PROCEDURE:  10/13/2014 DATE OF DISCHARGE:  10/13/2014                              OPERATIVE REPORT   PREOPERATIVE DIAGNOSIS:  Mediastinal and right hilar adenopathy.  POSTOPERATIVE DIAGNOSIS:  Mediastinal and right hilar adenopathy, probable sarcoidosis.  PROCEDURE:  Mediastinoscopy.  SURGEON:  Revonda Standard. Roxan Hockey, M.D.  ASSISTANT:  None.  ANESTHESIA:  General.  FINDINGS:  Enlarged, grossly abnormal nodes in 4R and 7 locations. Frozen section of the level 7 node revealed non-caseating granulomas consistent with sarcoidosis.  CLINICAL NOTE:  Gregory Fuller is a 33 year old gentleman who recently was found to have right hilar and mediastinal adenopathy.  These lesions were hypermetabolic by PET.  He underwent endobronchial ultrasound, which was nondiagnostic and was referred for surgical lymph node biopsy. Although the majority of the adenopathy was in the right hilum, there were some abnormal nodes in the subcarinal area and it was felt that this would likely be accessible by mediastinoscopy, and the plan was to proceed with mediastinoscopy initially, and then, if that was nondiagnostic, we would proceed with right video-assisted thoracoscopy. The indications, risks, benefits, and alternatives were discussed in detail with the patient.  He understood the intraoperative decision- making, he accepted the risks and agreed to proceed.  OPERATIVE NOTE:  Gregory Fuller was brought to the preoperative holding area on October 13, 2014.  There, Anesthesia placed a central line and an arterial blood pressure monitoring line.  Intravenous antibiotics were administered.  He was taken to the operating room, anesthetized, and intubated.  Sequential compressive devices  were placed on the calves for DVT prophylaxis.  The neck and chest were prepped and draped in usual sterile fashion.  A transverse incision was made 1 fingerbreadth above the sternal notch. This was carried through the skin and subcutaneous tissue.  The strap muscles were identified and separated in the midline.  The pretracheal fascia was identified and incised and the pretracheal fascia was developed bluntly into the mediastinum with fingertip dissection. The mediastinoscope then was inserted and systematic inspection of mediastinal lymph node stations were carried out.  Initially there was a smaller node in the 4R right paratracheal area that was granular in appearance.  Multiple biopsies were obtained of this node.  Next, a subcarinal node was identified.  This was larger node, grayish in appearance, with again a slightly granular texture.  A biopsy of this node was taken and was sent for frozen section.  A portion of the biopsy specimen was sent for AFB and fungal cultures.  While awaiting those results, an additional 4R node was identified.  This was larger and again gray and granular in appearance.  Biopsies were taken and sent for permanent pathology.  The wound was packed with gauze.  Frozen section subsequently returned showing noncaseating granulomas consistent with sarcoidosis.  The packing was removed, the mediastinoscope was reinserted, and a final inspection was made for hemostasis.  The mediastinoscope was withdrawn.  The incision was closed with a running  interrupted 3-0 Vicryl subcutaneous suture followed by a 4-0 Vicryl subcuticular suture. All sponge, needle, and instrument counts were correct at the end of the procedure.  The patient was taken from the operating room to the postanesthetic care unit, extubated, in good condition.     Revonda Standard Roxan Hockey, M.D.     SCH/MEDQ  D:  10/13/2014  T:  10/14/2014  Job:  [id number]

## 2014-10-15 ENCOUNTER — Encounter (HOSPITAL_COMMUNITY): Payer: Self-pay | Admitting: Thoracic Surgery (Cardiothoracic Vascular Surgery)

## 2014-10-15 NOTE — Telephone Encounter (Signed)
Pt scheduled for 11/19/14 at 4:30 with MR, next available.  Nothing further needed.

## 2014-10-15 NOTE — Telephone Encounter (Signed)
lmtcb for pt. Pt will need f/u with MR.

## 2014-10-17 LAB — TISSUE CULTURE
Culture: NO GROWTH
Gram Stain: NONE SEEN

## 2014-10-19 ENCOUNTER — Telehealth: Payer: Self-pay | Admitting: Internal Medicine

## 2014-10-19 NOTE — Telephone Encounter (Signed)
Pt is aware of MR's recommendations. He agreed and verbalized understanding.

## 2014-10-19 NOTE — Telephone Encounter (Signed)
Prednisone  - he is already on it  At visit, can discuss some prednisone sparing agents like methotrexate as add on tablets  No rush to come sooner than 11/19/14

## 2014-10-19 NOTE — Telephone Encounter (Signed)
Spoke with pt. He has been diagnosed with Sarcoidosis. Wants to know what the next step in his treatment plan is. OV has been scheduled with MR on 11/19/14 at 4:30pm. Pt wanted message sent to MR to see if he could give some insight on this before his appointment.  MR - please advise. Thanks.

## 2014-10-20 ENCOUNTER — Other Ambulatory Visit: Payer: Self-pay | Admitting: Internal Medicine

## 2014-10-21 ENCOUNTER — Telehealth: Payer: Self-pay | Admitting: Internal Medicine

## 2014-10-21 LAB — AFB CULTURE WITH SMEAR (NOT AT ARMC)
ACID FAST SMEAR: NONE SEEN
Acid Fast Smear: NONE SEEN
Special Requests: 10

## 2014-10-21 NOTE — Telephone Encounter (Signed)
Called and spoke to pt. Informed pt of the recs per MR. Pt verbalized understanding and denied any further issues at this time.  Dr. Sudie Grumbling number: [phone]. Pt was started on treatment on 10/20/14.

## 2014-10-21 NOTE — Telephone Encounter (Signed)
I knew he was going to see a doc at [facility] but I had no idea he started a teratment protocol already. Typically for sarcoid we do steroid/methotrexate not steroid/azathioprine. Please have him give [place] [facility] contact info and please let me know what that is so I can communicate with her  Abd pain - I am a pulmonary doc. Wish I knew how to handle GI issue. Not sure is side effect of drug he is on. Neeeds to call PCP or Dr Juline Patch    Dr. Brand Males, M.D., [facility].C.P Pulmonary and Critical Care Medicine Staff Physician [facility] Pager: [phone], If no answer or between  15:00h - 7:00h: call [phone]  10/21/2014 9:44 AM

## 2014-10-21 NOTE — Telephone Encounter (Signed)
Spoke Dr Juline Patch. 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 I support that. He should follow her instructions accurately  Thanks  Dr. Brand Males, M.D., [facility].C.P Pulmonary and Critical Care Medicine Staff Physician [facility] Pager: [phone], If no answer or between  15:00h - 7:00h: call [phone]  10/21/2014 5:37 PM

## 2014-10-21 NOTE — Telephone Encounter (Signed)
Pt wants MR to know that his neurologist at [facility] Dr. Lavera Guise has put him on azathioprine 50mg  started yesterday: take 2 tabs with food for 7 days, then 2 tabs bid X7 days, then 2 tabs tid indefinitely.  He is also being set up with home health to receive an IV steroid but does not know the name of it.  I advised him to give [place] the name and dosage of that med once he receives it.  He wants to make sure that MR knows that his PCP is Dr. Scarlette Ar at [place] [facility], his [place] neurologist is Dr. Jannifer Franklin, and his [facility] neurologist is Lavera Guise.  He doesn't know if these docs are communicating to effectively handle his care but wants to keep everyone informed.  I've added the azathioprine to pt's med list.  Pt also c/o some abdominal pain since Monday, has had difficulty having a bowel movement.  He has taken oral laxatives which did not help, drank milk of magnesia which did not help, and a saline enema which only aided some.  He is wanting some recs for this.  Please advise.  Thank you.

## 2014-10-22 NOTE — Telephone Encounter (Signed)
Pt aware. Nothing further needed 

## 2014-10-25 ENCOUNTER — Encounter (HOSPITAL_BASED_OUTPATIENT_CLINIC_OR_DEPARTMENT_OTHER): Payer: Self-pay | Admitting: *Deleted

## 2014-10-25 ENCOUNTER — Emergency Department (HOSPITAL_BASED_OUTPATIENT_CLINIC_OR_DEPARTMENT_OTHER)
Admission: EM | Admit: 2014-10-25 | Discharge: 2014-10-25 | Disposition: A | Discharge status: Home / Self Care | Payer: 59 | Attending: Emergency Medicine | Admitting: Emergency Medicine

## 2014-10-25 DIAGNOSIS — Z8669 Personal history of other diseases of the nervous system and sense organs: Secondary | ICD-10-CM | POA: Insufficient documentation

## 2014-10-25 DIAGNOSIS — R200 Anesthesia of skin: Secondary | ICD-10-CM | POA: Diagnosis not present

## 2014-10-25 DIAGNOSIS — Z7952 Long term (current) use of systemic steroids: Secondary | ICD-10-CM | POA: Diagnosis not present

## 2014-10-25 DIAGNOSIS — Z79899 Other long term (current) drug therapy: Secondary | ICD-10-CM | POA: Diagnosis not present

## 2014-10-25 DIAGNOSIS — K219 Gastro-esophageal reflux disease without esophagitis: Secondary | ICD-10-CM | POA: Insufficient documentation

## 2014-10-25 DIAGNOSIS — J45909 Unspecified asthma, uncomplicated: Secondary | ICD-10-CM | POA: Diagnosis not present

## 2014-10-25 DIAGNOSIS — M545 Low back pain: Secondary | ICD-10-CM | POA: Insufficient documentation

## 2014-10-25 DIAGNOSIS — M5489 Other dorsalgia: Secondary | ICD-10-CM

## 2014-10-25 LAB — CBC WITH DIFFERENTIAL/PLATELET
BASOS ABS: 0 10*3/uL (ref 0.0–0.1)
Basophils Relative: 0 % (ref 0–1)
EOS PCT: 0 % (ref 0–5)
Eosinophils Absolute: 0 10*3/uL (ref 0.0–0.7)
HCT: 37.4 % — ABNORMAL LOW (ref 39.0–52.0)
Hemoglobin: 12.8 g/dL — ABNORMAL LOW (ref 13.0–17.0)
LYMPHS PCT: 3 % — AB (ref 12–46)
Lymphs Abs: 0.5 10*3/uL — ABNORMAL LOW (ref 0.7–4.0)
MCH: 27.4 pg (ref 26.0–34.0)
MCHC: 34.2 g/dL (ref 30.0–36.0)
MCV: 79.9 fL (ref 78.0–100.0)
MONOS PCT: 1 % — AB (ref 3–12)
Monocytes Absolute: 0.2 10*3/uL (ref 0.1–1.0)
Neutro Abs: 14.3 10*3/uL — ABNORMAL HIGH (ref 1.7–7.7)
Neutrophils Relative %: 96 % — ABNORMAL HIGH (ref 43–77)
Platelets: 329 10*3/uL (ref 150–400)
RBC: 4.68 MIL/uL (ref 4.22–5.81)
RDW: 13.5 % (ref 11.5–15.5)
WBC: 15 10*3/uL — AB (ref 4.0–10.5)

## 2014-10-25 LAB — BASIC METABOLIC PANEL
ANION GAP: 16 — AB (ref 5–15)
BUN: 14 mg/dL (ref 6–23)
CO2: 24 mEq/L (ref 19–32)
Calcium: 10.1 mg/dL (ref 8.4–10.5)
Chloride: 101 mEq/L (ref 96–112)
Creatinine, Ser: 0.9 mg/dL (ref 0.50–1.35)
GFR calc Af Amer: >90 mL/min (ref >90)
GLUCOSE: 142 mg/dL — AB (ref 70–99)
POTASSIUM: 4.3 meq/L (ref 3.7–5.3)
SODIUM: 141 meq/L (ref 137–147)

## 2014-10-25 LAB — URINALYSIS, ROUTINE W REFLEX MICROSCOPIC
Bilirubin Urine: NEGATIVE
GLUCOSE, UA: NEGATIVE mg/dL
Hgb urine dipstick: NEGATIVE
KETONES UR: NEGATIVE mg/dL
Leukocytes, UA: NEGATIVE
NITRITE: NEGATIVE
Protein, ur: NEGATIVE mg/dL
Specific Gravity, Urine: 1.008 (ref 1.005–1.030)
Urobilinogen, UA: 0.2 mg/dL (ref 0.0–1.0)
pH: 6 (ref 5.0–8.0)

## 2014-10-25 MED ORDER — TRAMADOL HCL 50 MG PO TABS
50.0000 mg | ORAL_TABLET | Freq: Once | ORAL | Status: DC
  Filled 2014-10-25: qty 1

## 2014-10-25 NOTE — Discharge Instructions (Signed)
Back Pain, Adult Low back pain is very common. About 1 in 5 people have back pain.The cause of low back pain is rarely dangerous. The pain often gets better over time.About half of people with a sudden onset of back pain feel better in just 2 weeks. About 8 in 10 people feel better by 6 weeks.  CAUSES Some common causes of back pain include:  Strain of the muscles or ligaments supporting the spine.  Wear and tear (degeneration) of the spinal discs.  Arthritis.  Direct injury to the back. DIAGNOSIS Most of the time, the direct cause of low back pain is not known.However, back pain can be treated effectively even when the exact cause of the pain is unknown.Answering your caregiver's questions about your overall health and symptoms is one of the most accurate ways to make sure the cause of your pain is not dangerous. If your caregiver needs more information, he or she may order lab work or imaging tests (X-rays or MRIs).However, even if imaging tests show changes in your back, this usually does not require surgery. HOME CARE INSTRUCTIONS For many people, back pain returns.Since low back pain is rarely dangerous, it is often a condition that people can learn to manageon their own.   Remain active. It is stressful on the back to sit or stand in one place. Do not sit, drive, or stand in one place for more than 30 minutes at a time. Take short walks on level surfaces as soon as pain allows.Try to increase the length of time you walk each day.  Do not stay in bed.Resting more than 1 or 2 days can delay your recovery.  Do not avoid exercise or work.Your body is made to move.It is not dangerous to be active, even though your back may hurt.Your back will likely heal faster if you return to being active before your pain is gone.  Pay attention to your body when you bend and lift. Many people have less discomfortwhen lifting if they bend their knees, keep the load close to their bodies,and  avoid twisting. Often, the most comfortable positions are those that put less stress on your recovering back.  Find a comfortable position to sleep. Use a firm mattress and lie on your side with your knees slightly bent. If you lie on your back, put a pillow under your knees.  Only take over-the-counter or prescription medicines as directed by your caregiver. Over-the-counter medicines to reduce pain and inflammation are often the most helpful.Your caregiver may prescribe muscle relaxant drugs.These medicines help dull your pain so you can more quickly return to your normal activities and healthy exercise.  Put ice on the injured area.  Put ice in a plastic bag.  Place a towel between your skin and the bag.  Leave the ice on for 15-20 minutes, 03-04 times a day for the first 2 to 3 days. After that, ice and heat may be alternated to reduce pain and spasms.  Ask your caregiver about trying back exercises and gentle massage. This may be of some benefit.  Avoid feeling anxious or stressed.Stress increases muscle tension and can worsen back pain.It is important to recognize when you are anxious or stressed and learn ways to manage it.Exercise is a great option. SEEK MEDICAL CARE IF:  You have pain that is not relieved with rest or medicine.  You have pain that does not improve in 1 week.  You have new symptoms.  You are generally not feeling well. SEEK   IMMEDIATE MEDICAL CARE IF:   You have pain that radiates from your back into your legs.  You develop new bowel or bladder control problems.  You have unusual weakness or numbness in your arms or legs.  You develop nausea or vomiting.  You develop abdominal pain.  You feel faint. Document Released: 10/22/2005 Document Revised: 04/22/2012 Document Reviewed: 02/23/2014 [employer] Patient Information 2015 [employer]. This information is not intended to replace advice given to you by your health care provider. Make sure you  discuss any questions you have with your health care provider.  

## 2014-10-25 NOTE — ED Notes (Signed)
Pt. Has IV from previous placement and is infusing meds at his home per Dr. Montine Circle.

## 2014-10-25 NOTE — ED Notes (Signed)
Back pain x 2 days. No injury. Denies dysuria.

## 2014-10-25 NOTE — ED Provider Notes (Signed)
CSN: [id number]     Arrival date & time 10/25/14  1636 History  This chart was scribed for Quintella Reichert, MD by [facility], ED Scribe. The patient was seen in [place] and the patient's care was started at 5:27 PM.  Chief Complaint  Patient presents with  . Back Pain   Patient is a 33 y.o. male presenting with back pain. The history is provided by the patient. No language interpreter was used.  Back Pain Associated symptoms: numbness   Associated symptoms: no abdominal pain, no dysuria and no fever     HPI Comments: Gregory Fuller is a 33 y.o. male who presents to the Emergency Department complaining of intermittent lower back pain onset 4 days ago. Pt was recently diagnosised with sarcoidosis. He is on methylprednisolone, he took his dose today and has not been feeling well since.  He was told after taking his medication if he has pain to come to the ED. Has tingling and numbness in his legs (chronic problem) Has an IV since Friday for they methylprednisolone and is cleaning it once a day. He denies fever, vomiting, abdominal pain and dysuria.  Had a biopsy done on his chest Dec 7th and was given tramadol  Pt has not been to a rheumatologist. Works in [employer] (medical) but has not been working for the pas two weeks. Past Medical History  Diagnosis Date  . Asthma   . Subacute transverse myelitis   . Bronchitis   . GERD (gastroesophageal reflux disease)    Past Surgical History  Procedure Laterality Date  . No past surgeries    . Video bronchoscopy with endobronchial ultrasound N/A 09/08/2014    Procedure: VIDEO BRONCHOSCOPY WITH ENDOBRONCHIAL ULTRASOUND;  Surgeon: Brand Males, MD;  Location: [place];  Service: Thoracic;  Laterality: N/A;  . Mediastinoscopy N/A 10/13/2014    Procedure: MEDIASTINOSCOPY;  Surgeon: Melrose Nakayama, MD;  Location: [place];  Service: Thoracic;  Laterality: N/A;  . Lymph node biopsy N/A 10/13/2014    Procedure: LYMPH NODE BIOPSY;  Surgeon: Melrose Nakayama, MD;  Location: [facility] OR;  Service: Thoracic;  Laterality: N/A;   Family History  Problem Relation Age of Onset  . Diabetes Other   . Thyroid disease Mother   . Seizures Father    History  Substance Use Topics  . Smoking status: Never Smoker   . Smokeless tobacco: Never Used     Comment: pt states he smoked socially  . Alcohol Use: Yes     Comment: occassional weekends    Review of Systems  Constitutional: Negative for fever.  Gastrointestinal: Negative for vomiting and abdominal pain.  Genitourinary: Negative for dysuria.  Musculoskeletal: Positive for back pain.  Neurological: Positive for numbness.  All other systems reviewed and are negative.  Allergies  Vicodin  Home Medications   Prior to Admission medications   Medication Sig Start Date End Date Taking? Authorizing Provider  acetaminophen (TYLENOL) 500 MG tablet Take 750 mg by mouth every 6 (six) hours as needed for mild pain.     Historical Provider, MD  albuterol (PROVENTIL HFA;VENTOLIN HFA) 108 (90 BASE) MCG/ACT inhaler Inhale 2 puffs into the lungs every 6 (six) hours as needed for wheezing or shortness of breath. 09/08/14   Brand Males, MD  azaTHIOprine (IMURAN) 50 MG tablet Take 50 mg by mouth. Start 10/20/14.  Take 2 tabs with food qd X7 days, then 2 tabs bid X7 days, then 2 tabs tid.    Historical Provider, MD  gabapentin (NEURONTIN) 300 MG capsule Take 1 capsule (300 mg total) by mouth 2 (two) times daily. 08/17/14   Kathrynn Ducking, MD  Multiple Vitamin (ONE-A-DAY MENS PO) Take 1 tablet by mouth daily.    Historical Provider, MD  pantoprazole (PROTONIX) 40 MG tablet Take 1 tablet (40 mg total) by mouth daily. 08/09/14   Reyne Dumas, MD  predniSONE (DELTASONE) 10 MG tablet Take 5 mg by mouth daily with breakfast.    Historical Provider, MD  Simethicone (GAS-X PO) Take 2 tablets by mouth 2 (two) times daily as needed (for gas).     Historical Provider, MD  traMADol (ULTRAM) 50 MG tablet Take 1-2  tablets (50-100 mg total) by mouth every 6 (six) hours as needed (pain). 10/13/14   Melrose Nakayama, MD   BP 145/77 mmHg  Pulse 81  Temp(Src) 98.9 F (37.2 C) (Oral)  Resp 20  Ht 5\' 6"  (1.676 m)  Wt 207 lb (93.895 kg)  BMI 33.43 kg/m2  SpO2 100% Physical Exam  Constitutional: He is oriented to person, place, and time. He appears well-developed and well-nourished.  HENT:  Head: Normocephalic and atraumatic.  Cardiovascular: Normal rate.   No murmur heard. Pulmonary/Chest: Effort normal. No respiratory distress.  Abdominal: Soft. There is no tenderness. There is no rebound and no guarding.  Musculoskeletal: He exhibits no edema or tenderness.  Discrete C, T, L-spine tenderness. No CVA tenderness.  Neurological: He is alert and oriented to person, place, and time.  5 out of 5 strength in bilateral lower extremities. Sensation light touch intact in bilateral lower extremities.  Skin: Skin is warm and dry.  Psychiatric: He has a normal mood and affect. His behavior is normal.  Nursing note and vitals reviewed.   ED Course  Procedures  DIAGNOSTIC STUDIES: Oxygen Saturation is 100% on room air, normal by my interpretation.    COORDINATION OF CARE: 5:38 PM Discussed treatment plan with pt at bedside and pt agreed to plan.  Labs Review Labs Reviewed  BASIC METABOLIC PANEL - Abnormal; Notable for the following:    Glucose, Bld 142 (*)    Anion gap 16 (*)    All other components within normal limits  CBC WITH DIFFERENTIAL - Abnormal; Notable for the following:    WBC 15.0 (*)    Hemoglobin 12.8 (*)    HCT 37.4 (*)    Neutrophils Relative % 96 (*)    Lymphocytes Relative 3 (*)    Monocytes Relative 1 (*)    Neutro Abs 14.3 (*)    Lymphs Abs 0.5 (*)    All other components within normal limits  URINALYSIS, ROUTINE W REFLEX MICROSCOPIC   Imaging Review No results found.   EKG Interpretation None      MDM   Final diagnoses:  Right-sided back pain, unspecified  location    Patient here for right-sided back pain, currently receiving IV steroids for her sarcoidosis. Clinical picture is not consistent with serious bacterial infection, urinary tract infection, renal colic, cholecystitis, epidural abscess. Patient does not have pain in the emergency department and declined pain medicine here. CBC with mild leukocytosis, feel this is appropriately reactive since patient is on IV steroid therapy. Discussed with patient PCP follow-up as well as return precautions for back pain.   I personally performed the services described in this documentation, which was scribed in my presence. The recorded information has been reviewed and is accurate.   Quintella Reichert, MD 10/26/14 [phone]

## 2014-10-26 ENCOUNTER — Ambulatory Visit (INDEPENDENT_AMBULATORY_CARE_PROVIDER_SITE_OTHER): Payer: 59 | Admitting: Thoracic Surgery (Cardiothoracic Vascular Surgery)

## 2014-10-26 ENCOUNTER — Encounter: Payer: Self-pay | Admitting: Thoracic Surgery (Cardiothoracic Vascular Surgery)

## 2014-10-26 VITALS — BP 146/86 | HR 65 | Resp 16 | Ht 66.0 in | Wt 211.0 lb

## 2014-10-26 DIAGNOSIS — Z9889 Other specified postprocedural states: Secondary | ICD-10-CM

## 2014-10-26 DIAGNOSIS — J8410 Pulmonary fibrosis, unspecified: Secondary | ICD-10-CM

## 2014-10-26 DIAGNOSIS — D861 Sarcoidosis of lymph nodes: Secondary | ICD-10-CM

## 2014-10-26 DIAGNOSIS — J984 Other disorders of lung: Secondary | ICD-10-CM

## 2014-10-26 NOTE — Progress Notes (Signed)
  HPI:  33 -year-old man with mediastinal adenopathy. We did a mediastinoscopy on 10/13/2014. He turned out to have sarcoidosis. He has been started on steroids and Imuran.  He had a little bit of discomfort with his incision. He hasn't had any redness or drainage. He is anxious to return to work.  Past Medical History  Diagnosis Date  . Asthma   . Subacute transverse myelitis   . Bronchitis   . GERD (gastroesophageal reflux disease)       Current Outpatient Prescriptions  Medication Sig Dispense Refill  . acetaminophen (TYLENOL) 500 MG tablet Take 750 mg by mouth every 6 (six) hours as needed for mild pain.     Marland Kitchen albuterol (PROVENTIL HFA;VENTOLIN HFA) 108 (90 BASE) MCG/ACT inhaler Inhale 2 puffs into the lungs every 6 (six) hours as needed for wheezing or shortness of breath. 1 Inhaler 6  . azaTHIOprine (IMURAN) 50 MG tablet Take 50 mg by mouth. Start 10/20/14.  Take 2 tabs with food qd X7 days, then 2 tabs bid X7 days, then 2 tabs tid.    . gabapentin (NEURONTIN) 300 MG capsule Take 1 capsule (300 mg total) by mouth 2 (two) times daily. 60 capsule 3  . Multiple Vitamin (ONE-A-DAY MENS PO) Take 1 tablet by mouth daily.    . pantoprazole (PROTONIX) 40 MG tablet Take 1 tablet (40 mg total) by mouth daily. 60 tablet 0  . predniSONE (DELTASONE) 10 MG tablet Take 5 mg by mouth daily with breakfast.    . Simethicone (GAS-X PO) Take 2 tablets by mouth 2 (two) times daily as needed (for gas).     . traMADol (ULTRAM) 50 MG tablet Take 1-2 tablets (50-100 mg total) by mouth every 6 (six) hours as needed (pain). 30 tablet 0   No current facility-administered medications for this visit.    Physical Exam BP 146/86 mmHg  Pulse 65  Resp 16  Ht 5\' 6"  (1.676 m)  Wt 211 lb (95.709 kg)  BMI 34.07 kg/m2  SpO2 99% Incision healing well Lungs clear  Diagnostic Tests: CHEST - 2 VIEW  COMPARISON: 08/04/2014  FINDINGS: Mild right hilar prominence correlates with the known PET  positive adenopathy. Normal heart size and vascularity. Lungs remain clear. No focal airspace process, collapse or consolidation. No effusion or pneumothorax. Trachea midline. No acute osseous finding.  IMPRESSION: Mildly prominent right hilum correlates with known right hilar adenopathy. No acute chest process otherwise.   Electronically Signed  By: Daryll Brod M.D.  On: 10/11/2014 17:  Impression: 33 year old man status post mediastinoscopy which revealed sarcoidosis. He is doing well and has not had any complications. He may return to work on December 26. He will need to be on light duty and take frequent short breaks initially after return but should be back full-time in 2 weeks after that.  I will be happy to see him back at any time if I can be of any further assistance with his care

## 2014-11-03 ENCOUNTER — Encounter (HOSPITAL_COMMUNITY): Payer: Self-pay

## 2014-11-03 ENCOUNTER — Emergency Department (HOSPITAL_COMMUNITY)
Admission: EM | Admit: 2014-11-03 | Discharge: 2014-11-04 | Disposition: A | Discharge status: Home / Self Care | Payer: 59 | Attending: Emergency Medicine | Admitting: Emergency Medicine

## 2014-11-03 DIAGNOSIS — J45909 Unspecified asthma, uncomplicated: Secondary | ICD-10-CM | POA: Insufficient documentation

## 2014-11-03 DIAGNOSIS — R0789 Other chest pain: Secondary | ICD-10-CM | POA: Diagnosis not present

## 2014-11-03 DIAGNOSIS — D869 Sarcoidosis, unspecified: Secondary | ICD-10-CM | POA: Insufficient documentation

## 2014-11-03 DIAGNOSIS — K219 Gastro-esophageal reflux disease without esophagitis: Secondary | ICD-10-CM | POA: Insufficient documentation

## 2014-11-03 DIAGNOSIS — Z79899 Other long term (current) drug therapy: Secondary | ICD-10-CM | POA: Diagnosis not present

## 2014-11-03 DIAGNOSIS — Z8669 Personal history of other diseases of the nervous system and sense organs: Secondary | ICD-10-CM | POA: Insufficient documentation

## 2014-11-03 DIAGNOSIS — R079 Chest pain, unspecified: Secondary | ICD-10-CM | POA: Diagnosis present

## 2014-11-03 LAB — CBC
HCT: 39.6 % (ref 39.0–52.0)
HEMOGLOBIN: 13.4 g/dL (ref 13.0–17.0)
MCH: 27.1 pg (ref 26.0–34.0)
MCHC: 33.8 g/dL (ref 30.0–36.0)
MCV: 80.2 fL (ref 78.0–100.0)
Platelets: 276 10*3/uL (ref 150–400)
RBC: 4.94 MIL/uL (ref 4.22–5.81)
RDW: 14.5 % (ref 11.5–15.5)
WBC: 6.8 10*3/uL (ref 4.0–10.5)

## 2014-11-03 LAB — BASIC METABOLIC PANEL
Anion gap: 8 (ref 5–15)
BUN: 9 mg/dL (ref 6–23)
CO2: 25 mmol/L (ref 19–32)
CREATININE: 0.85 mg/dL (ref 0.50–1.35)
Calcium: 9.1 mg/dL (ref 8.4–10.5)
Chloride: 101 mEq/L (ref 96–112)
GFR calc Af Amer: >90 mL/min (ref >90)
GFR calc non Af Amer: >90 mL/min (ref >90)
Glucose, Bld: 104 mg/dL — ABNORMAL HIGH (ref 70–99)
POTASSIUM: 3.9 mmol/L (ref 3.5–5.1)
Sodium: 134 mmol/L — ABNORMAL LOW (ref 135–145)

## 2014-11-03 LAB — I-STAT TROPONIN, ED: Troponin i, poc: 0 ng/mL (ref 0.00–0.08)

## 2014-11-03 MED ORDER — ALPRAZOLAM 0.5 MG PO TABS
0.5000 mg | ORAL_TABLET | Freq: Once | ORAL | Status: AC
  Administered 2014-11-04: 0.5 mg via ORAL
  Filled 2014-11-03: qty 1

## 2014-11-03 MED ORDER — ALPRAZOLAM 0.5 MG PO TABS: 0.5000 mg | ORAL_TABLET | Freq: Every evening | ORAL | Status: DC | PRN

## 2014-11-03 NOTE — ED Notes (Signed)
Pt presents with c/o left side rib pain that radiates to the center part of his chest. Pt reports he has had this pain for a while but it has gotten worse. Pt denies any dizziness. Pt is in NAD.

## 2014-11-03 NOTE — ED Provider Notes (Signed)
CSN: [id number]     Arrival date & time 11/03/14  2203 History   First MD Initiated Contact with Patient 11/03/14 2210     Chief Complaint  Patient presents with  . Chest Pain     (Consider location/radiation/quality/duration/timing/severity/associated sxs/prior Treatment) HPI  The patient has a new or diagnosis of sarcoidosis. He reports since that diagnosis in October he has had a problem with left-sided chest pain. He gets a sharp and intermittent pain that wraps around his mid thorax. He denies any associated shortness of breath or cough or fever. He reports that since being started on gabapentin, it helped quite a bit. He reports however when he lies down to go to sleep at night is when the pain usually gets more pronounced. He reports it might wake him up a couple times at night. He also reports it is made worse by lying on his right side. He reports he can go through much the day with having very little symptoms. He feels that there may be some component of anxiety and stress at night time as that is when he becomes most worried about the diagnosis. He reports he is still suffering from concern and fear regarding this new diagnosis and his ongoing treatment. He also reports it seemed better when he was on prednisone. He reports he finished prednisone about a week ago.  Past Medical History  Diagnosis Date  . Asthma   . Subacute transverse myelitis   . Bronchitis   . GERD (gastroesophageal reflux disease)    Past Surgical History  Procedure Laterality Date  . No past surgeries    . Video bronchoscopy with endobronchial ultrasound N/A 09/08/2014    Procedure: VIDEO BRONCHOSCOPY WITH ENDOBRONCHIAL ULTRASOUND;  Surgeon: Brand Males, MD;  Location: [place];  Service: Thoracic;  Laterality: N/A;  . Mediastinoscopy N/A 10/13/2014    Procedure: MEDIASTINOSCOPY;  Surgeon: Melrose Nakayama, MD;  Location: [place];  Service: Thoracic;  Laterality: N/A;  . Lymph node biopsy N/A 10/13/2014    Procedure: LYMPH NODE BIOPSY;  Surgeon: Melrose Nakayama, MD;  Location: [facility] OR;  Service: Thoracic;  Laterality: N/A;   Family History  Problem Relation Age of Onset  . Diabetes Other   . Thyroid disease Mother   . Seizures Father    History  Substance Use Topics  . Smoking status: Never Smoker   . Smokeless tobacco: Never Used     Comment: pt states he smoked socially  . Alcohol Use: Yes     Comment: occassional weekends    Review of Systems  10 Systems reviewed and are negative for acute change except as noted in the HPI.   Allergies  Review of patient's allergies indicates no known allergies.  Home Medications   Prior to Admission medications   Medication Sig Start Date End Date Taking? Authorizing Provider  acetaminophen (TYLENOL) 500 MG tablet Take 750 mg by mouth every 6 (six) hours as needed for mild pain.    Yes Historical Provider, MD  albuterol (PROVENTIL HFA;VENTOLIN HFA) 108 (90 BASE) MCG/ACT inhaler Inhale 2 puffs into the lungs every 6 (six) hours as needed for wheezing or shortness of breath. 09/08/14  Yes Brand Males, MD  gabapentin (NEURONTIN) 300 MG capsule Take 1 capsule (300 mg total) by mouth 2 (two) times daily. 08/17/14  Yes Kathrynn Ducking, MD  Multiple Vitamin (ONE-A-DAY MENS PO) Take 1 tablet by mouth daily.   Yes Historical Provider, MD  pantoprazole (PROTONIX) 40 MG tablet Take  1 tablet (40 mg total) by mouth daily. 08/09/14  Yes Reyne Dumas, MD  Simethicone (GAS-X PO) Take 2 tablets by mouth 2 (two) times daily as needed (for gas).    Yes Historical Provider, MD  traMADol (ULTRAM) 50 MG tablet Take 1-2 tablets (50-100 mg total) by mouth every 6 (six) hours as needed (pain). 10/13/14  Yes Melrose Nakayama, MD  ALPRAZolam Duanne Moron) 0.5 MG tablet Take 1 tablet (0.5 mg total) by mouth at bedtime as needed for anxiety. 11/03/14   Charlesetta Shanks, MD   BP 145/86 mmHg  Pulse 105  Temp(Src) 98.5 F (36.9 C) (Oral)  Resp 16  SpO2 100% Physical  Exam  Constitutional: He is oriented to person, place, and time. He appears well-developed and well-nourished.  HENT:  Head: Normocephalic and atraumatic.  Eyes: EOM are normal. Pupils are equal, round, and reactive to light.  Neck: Neck supple.  Cardiovascular: Normal rate, regular rhythm, normal heart sounds and intact distal pulses.   Pulmonary/Chest: Effort normal and breath sounds normal.  Abdominal: Soft. Bowel sounds are normal. He exhibits no distension. There is no tenderness.  Musculoskeletal: Normal range of motion. He exhibits no edema.  Neurological: He is alert and oriented to person, place, and time. He has normal strength. Coordination normal. GCS eye subscore is 4. GCS verbal subscore is 5. GCS motor subscore is 6.  Skin: Skin is warm, dry and intact.  Psychiatric: He has a normal mood and affect.    ED Course  Procedures (including critical care time) Labs Review Labs Reviewed  BASIC METABOLIC PANEL - Abnormal; Notable for the following:    Sodium 134 (*)    Glucose, Bld 104 (*)    All other components within normal limits  CBC  I-STAT TROPOININ, ED    Imaging Review No results found.   EKG Interpretation   Date/Time:  Wednesday November 03 2014 22:10:20 EST Ventricular Rate:  104 PR Interval:  138 QRS Duration: 73 QT Interval:  307 QTC Calculation: 404 R Axis:   73 Text Interpretation:  Sinus tachycardia ST elev, probable normal early  repol pattern agree. consistent with prior Confirmed by Johnney Killian, MD,  Jeannie Done [phone]) on 11/03/2014 11:38:16 PM      MDM   Final diagnoses:  Other chest pain  Sarcoidosis   At this point the patient is well in appearance. He does not have any dyspnea fever or hypoxia. The symptoms are much more pronounced at night when the patient is at rest. Review of prior CT scans and pet scan does show that he has mediastinal lymphadenopathy present and some pleural-based nodules. At this time there does not appear to be any  acute change. Clinically the patient has a stable and well appearance. Due to the chronicity of the symptoms I did not feel this was likely to be a PE or cardiac ischemic in nature. As well the pattern of pain being more pronounced at night with patient at rest was more atypical for those diagnoses. I feel the more likely etiology is an inflammatory lesion involving soft tissues. The patient has been counseled on signs and symptoms for which to return. He'll be treated with Xanax for nighttime anxiety and see his family physician for reassessment.    Charlesetta Shanks, MD 11/03/14 [phone]

## 2014-11-03 NOTE — Discharge Instructions (Signed)
Chest Pain (Nonspecific) °It is often hard to give a specific diagnosis for the cause of chest pain. There is always a chance that your pain could be related to something serious, such as a heart attack or a blood clot in the lungs. You need to follow up with your health care provider for further evaluation. °CAUSES  °· Heartburn. °· Pneumonia or bronchitis. °· Anxiety or stress. °· Inflammation around your heart (pericarditis) or lung (pleuritis or pleurisy). °· A blood clot in the lung. °· A collapsed lung (pneumothorax). It can develop suddenly on its own (spontaneous pneumothorax) or from trauma to the chest. °· Shingles infection (herpes zoster virus). °The chest wall is composed of bones, muscles, and cartilage. Any of these can be the source of the pain. °· The bones can be bruised by injury. °· The muscles or cartilage can be strained by coughing or overwork. °· The cartilage can be affected by inflammation and become sore (costochondritis). °DIAGNOSIS  °Lab tests or other studies may be needed to find the cause of your pain. Your health care provider may have you take a test called an ambulatory electrocardiogram (ECG). An ECG records your heartbeat patterns over a 24-hour period. You may also have other tests, such as: °· Transthoracic echocardiogram (TTE). During echocardiography, sound waves are used to evaluate how blood flows through your heart. °· Transesophageal echocardiogram (TEE). °· Cardiac monitoring. This allows your health care provider to monitor your heart rate and rhythm in real time. °· Holter monitor. This is a portable device that records your heartbeat and can help diagnose heart arrhythmias. It allows your health care provider to track your heart activity for several days, if needed. °· Stress tests by exercise or by giving medicine that makes the heart beat faster. °TREATMENT  °· Treatment depends on what may be causing your chest pain. Treatment may include: °· Acid blockers for  heartburn. °· Anti-inflammatory medicine. °· Pain medicine for inflammatory conditions. °· Antibiotics if an infection is present. °· You may be advised to change lifestyle habits. This includes stopping smoking and avoiding alcohol, caffeine, and chocolate. °· You may be advised to keep your head raised (elevated) when sleeping. This reduces the chance of acid going backward from your stomach into your esophagus. °Most of the time, nonspecific chest pain will improve within 2-3 days with rest and mild pain medicine.  °HOME CARE INSTRUCTIONS  °· If antibiotics were prescribed, take them as directed. Finish them even if you start to feel better. °· For the next few days, avoid physical activities that bring on chest pain. Continue physical activities as directed. °· Do not use any tobacco products, including cigarettes, chewing tobacco, or electronic cigarettes. °· Avoid drinking alcohol. °· Only take medicine as directed by your health care provider. °· Follow your health care provider's suggestions for further testing if your chest pain does not go away. °· Keep any follow-up appointments you made. If you do not go to an appointment, you could develop lasting (chronic) problems with pain. If there is any problem keeping an appointment, call to reschedule. °SEEK MEDICAL CARE IF:  °· Your chest pain does not go away, even after treatment. °· You have a rash with blisters on your chest. °· You have a fever. °SEEK IMMEDIATE MEDICAL CARE IF:  °· You have increased chest pain or pain that spreads to your arm, neck, jaw, back, or abdomen. °· You have shortness of breath. °· You have an increasing cough, or you cough   up blood.  You have severe back or abdominal pain.  You feel nauseous or vomit.  You have severe weakness.  You faint.  You have chills. This is an emergency. Do not wait to see if the pain will go away. Get medical help at once. Call your local emergency services (911 in [place] Do not drive  yourself to the hospital. MAKE SURE YOU:   Understand these instructions.  Will watch your condition.  Will get help right away if you are not doing well or get worse. Document Released: 08/01/2005 Document Revised: 10/27/2013 Document Reviewed: 05/27/2008 [facility] Patient Information 2015 [place], [place]. This information is not intended to replace advice given to you by your health care provider. Make sure you discuss any questions you have with your health care provider. Sarcoidosis, Schaumann's Disease, Sarcoid of [place] Sarcoidosis appears briefly and heals naturally in 32 to 70 percent of cases, often without the patient knowing or doing anything about it. 20 to 30 percent of patients with sarcoidosis are left with some permanent lung damage. In 10 to 15 percent of the patients, sarcoidosis can become chronic (long lasting). When either the granulomas or fibrosis seriously affect the function of a vital organ (lungs, heart, nervous system, liver, or kidneys), sarcoidosis can be fatal. This occurs 5 to 10 percent of the time. No one can predict how sarcoidosis will progress in an individual patient. The symptoms the patient experiences, the caregiver's findings, and the patient's race can give some clues. Sarcoidosis was once considered a rare disease. We now know that it is a common chronic illness that appears all over the world. It is the most common of the fibrotic (scarring) lung disorders. Anyone can get sarcoidosis. It occurs in all races and in both sexes. The risk is greater if you are a young black adult, especially a black woman, or are of [place], [place], [place], or [place] origin. In sarcoidosis, small lumps (also called nodules or granulomas) develop in multiple organs of the body. These granulomas are small collections of inflamed cells. They commonly appear in the lungs. This is the most common organ affected. They also occur in the lymph nodes (your glands), skin, liver,  and eyes. The granulomas vary in the amount of disease they produce from very little with no problems (symptoms) to causing severe illness. The cause of sarcoidosis is not known. It may be due to an abnormal immune reaction in the body. Most people will recover. A few people will develop long lasting conditions that may get worse. Women are affected more often than men. The majority of those affected are under 61 years of age. Because we do not know the cause, we do not have ways to prevent it. SYMPTOMS   Fever.  Loss of appetite.  Night sweats.  Joint pain.  Aching muscles Symptoms vary because the disease affects different parts of the body in different people. Most people who see their caregiver with sarcoidosis have lung problems. The first signs are usually a dry cough and shortness of breath. There may also be wheezing, chest pain, or a cough that brings up bloody mucus. In severe cases, lung function may become so poor that the person cannot perform even the simple routine tasks of daily life. Other symptoms of sarcoidosis are less common than lung symptoms. They can include:  Skin symptoms. Sarcoidosis can appear as a collection of tender, red bumps called erythema nodosum. These bumps usually occur on the face, shins, and arms. They can  also occur as a scaly, purplish discoloration on the nose, cheeks, and ears. This is called lupus pernio. Less often, sarcoidosis causes cysts, pimples, or disfiguring over growths of skin. In many cases, the disfiguring over growths develop in areas of scars or tattoos.  Eye symptoms. These include redness, eye pain, and sensitivity to light.  Heart symptoms. These include irregular heartbeat and heart failure.  Other symptoms. A person may have paralyzed facial muscles, seizures, psychiatric symptoms, swollen salivary glands, or bone pain. DIAGNOSIS  Even when there are no symptoms, your caregiver can sometimes pick up signs of sarcoidosis during  a routine examination, usually through a chest x-ray or when checking other complaints. The patient's age and race or ethnic group can raise an additional red flag that a sign or symptom could be related to sarcoidosis.   Enlargement of the salivary or tear glands and cysts in bone tissue may also be caused by sarcoidosis.  You may have had a biopsy done that shows signs of sarcoidosis. A biopsy is a small tissue sample that is removed for laboratory testing. This tissue sample can be taken from your lung, skin, lip, or another inflamed or abnormal area of the body.  You may have had an abnormal chest X-ray. Although you appear healthy, a chest X-ray ordered for other reasons may turn up abnormalities that suggest sarcoidosis.  Other tests may be needed. These tests may be done to rule out other illnesses or to determine the amount of organ damage caused by sarcoidosis. Some of the most common tests are:  Blood levels of calcium or angiotensin-converting enzyme may be high in people with sarcoidosis.  Blood tests to evaluate how well your liver is functioning.  Lung function tests to measure how well you are breathing.  A complete eye examination. TREATMENT  If sarcoidosis does not cause any problems, treatment may not be necessary. Your caregiver may decide to simply monitor your condition. As part of this monitoring process, you may have frequent office visits, follow-up chest X-rays, and tests of your lung function.If you have signs of moderate or severe lung disease, your doctor may recommend:  A corticosteroid drug, such as prednisone (sold under several brand names).  Corticosteroids also are used to treat sarcoidosis of the eyes, joints, skin, nerves, or heart.  Corticosteroid eye drops may be used for the eyes.  Over-the-counter medications like nonsteroidal anti-inflammatory drugs (NSAID) often are used to treat joint pain first before corticosteroids, which tend to have more side  effects.  If corticosteroids are not effective or cause serious side effects, other drugs that alter or suppress the immune system may be used.  In rare cases, when sarcoidosis causes life-threatening lung disease, a lung transplant may be necessary. However, there is some risk that the new lungs also will be attacked by sarcoidosis. SEEK IMMEDIATE MEDICAL CARE IF:   You suffer from shortness of breath or a lingering cough.  You develop new problems that may be related to the disease. Remember this disease can affect almost all organs of the body and cause many different problems. Document Released: 08/22/2004 Document Revised: 01/14/2012 Document Reviewed: 02/17/2014 [facility] Patient Information 2015 [place], [place]. This information is not intended to replace advice given to you by your health care provider. Make sure you discuss any questions you have with your health care provider.

## 2014-11-04 NOTE — ED Notes (Signed)
Pt ambulating independently w/ steady gait on d/c in no acute distress, A&Ox4. D/c instructions reviewed w/ pt and family - pt and family deny any further questions or concerns at present. Rx given x1  

## 2014-11-08 ENCOUNTER — Telehealth: Payer: Self-pay | Admitting: *Deleted

## 2014-11-08 MED ORDER — GABAPENTIN 300 MG PO CAPS: 300.0000 mg | ORAL_CAPSULE | Freq: Three times a day (TID) | ORAL | Status: DC

## 2014-11-08 NOTE — Telephone Encounter (Signed)
Patient wishing to up dose on Gabapentin, states that it is helping but when it wears off, he feels the pain, currently taking 2 pills a day, would like to take 3 pills a day instead, please advise.

## 2014-11-08 NOTE — Telephone Encounter (Signed)
I called the patient. If a higher dose of gabapentin will work, okay to increase to 3 times daily, I will call in a prescription.

## 2014-11-12 ENCOUNTER — Other Ambulatory Visit: Payer: Self-pay | Admitting: Internal Medicine

## 2014-11-13 LAB — FUNGUS CULTURE W SMEAR: FUNGAL SMEAR: NONE SEEN

## 2014-11-19 ENCOUNTER — Ambulatory Visit (INDEPENDENT_AMBULATORY_CARE_PROVIDER_SITE_OTHER): Payer: 59 | Admitting: Internal Medicine

## 2014-11-19 ENCOUNTER — Encounter: Payer: Self-pay | Admitting: Thoracic Surgery (Cardiothoracic Vascular Surgery)

## 2014-11-19 ENCOUNTER — Encounter: Payer: Self-pay | Admitting: Internal Medicine

## 2014-11-19 ENCOUNTER — Telehealth: Payer: Self-pay | Admitting: Internal Medicine

## 2014-11-19 VITALS — BP 170/92 | HR 92 | Ht 66.5 in | Wt 197.0 lb

## 2014-11-19 DIAGNOSIS — R590 Localized enlarged lymph nodes: Secondary | ICD-10-CM

## 2014-11-19 DIAGNOSIS — R599 Enlarged lymph nodes, unspecified: Secondary | ICD-10-CM

## 2014-11-19 DIAGNOSIS — D8689 Sarcoidosis of other sites: Secondary | ICD-10-CM | POA: Insufficient documentation

## 2014-11-19 NOTE — Patient Instructions (Signed)
ICD-9-CM ICD-10-CM   1. Mediastinal lymphadenopathy 785.6 R59.9   2. Neurosarcoidosis in adult 135 D86.89     - glad respiratory wise you are doing well - for ataxia and neural pain  - agree with PT   - try Dr. Camillia Herter, [facility]   Address: [address] [place], [place] [address]   [phone] -- agree and support Dr  Pearline Cables decision to start you on INFLIXIMAB and continue prednisone  - please give my nurse DR GRay contact so we can send him my note  Followup   3 months or sooner if needed

## 2014-11-19 NOTE — Telephone Encounter (Signed)
plese send my note to Dr Pearline Cables at [facility] rheumatology

## 2014-11-19 NOTE — Progress Notes (Signed)
Subjective:    Patient ID: Gregory Fuller, male    DOB: 05/20/1981, 34 y.o.   MRN: [id number]  HPI    OV 11/19/2014 Chief Complaint  Patient presents with  . Follow-up    Pt stated his breathing is unchanged since last OV. Pt c/o DOE, prod cough with white mucus and chest tightness in morning.    I am seeing Mr. Gregory Fuller after a few months. He has made a similar adenopathy and a PET hot  cord lesions along with some chest symptoms suggestive of radical up with the. Concern is neurosarcoidosis with early stage pulmonary sarcoid. Therefore he underwent mediastinoscopy this were 9 2015 by Dr. Roxan Hockey and this confirmed noncaseating granuloma consistent with sarcoidosis. Clinical diagnosis of sarcoidosis was made based on age, ethnicity, appropriate lesions even though his angiotensin-converting enzyme was normal 08/04/2014.  Since his last visit and the biopsy he has now registered with [facility] neurology department Dr. Lavera Guise were added Imuran to his therapy along with prednisone. He then got referred to Dr. Pearline Cables rheumatology at [facility] who is now considering switching his Imuran [I think he is already off this] 2 TNF alpha blocker infliximab.  From a respiratory standpoint he is stable without any symptoms of cough, chest tightness, wheezing. His main issue is that he's having gait instability since December 2015 along with some chest tightness on tingling all suggestive of neuro radiculopathy from his spine. This bothers him more than anything else  He continues on prednisone\  Pastmedical record history reviewed and as above     Review of Systems  Constitutional: Negative for fever and unexpected weight change.  HENT: Negative for congestion, dental problem, ear pain, nosebleeds, postnasal drip, rhinorrhea, sinus pressure, sneezing, sore throat and trouble swallowing.   Eyes: Negative for redness and itching.  Respiratory: Positive for  cough, chest tightness and shortness of breath. Negative for wheezing.   Cardiovascular: Negative for palpitations and leg swelling.  Gastrointestinal: Negative for nausea and vomiting.  Genitourinary: Negative for dysuria.  Musculoskeletal: Negative for joint swelling.  Skin: Negative for rash.  Neurological: Negative for headaches.  Hematological: Does not bruise/bleed easily.  Psychiatric/Behavioral: Negative for dysphoric mood. The patient is not nervous/anxious.     Current outpatient prescriptions:  .  acetaminophen (TYLENOL) 500 MG tablet, Take 750 mg by mouth every 6 (six) hours as needed for mild pain. , Disp: , Rfl:  .  albuterol (PROVENTIL HFA;VENTOLIN HFA) 108 (90 BASE) MCG/ACT inhaler, Inhale 2 puffs into the lungs every 6 (six) hours as needed for wheezing or shortness of breath., Disp: 1 Inhaler, Rfl: 6 .  ALPRAZolam (XANAX) 0.5 MG tablet, Take 1 tablet (0.5 mg total) by mouth at bedtime as needed for anxiety., Disp: 20 tablet, Rfl: 0 .  gabapentin (NEURONTIN) 300 MG capsule, Take 1 capsule (300 mg total) by mouth 3 (three) times daily., Disp: 90 capsule, Rfl: 3 .  ibuprofen (ADVIL,MOTRIN) 200 MG tablet, Take 200 mg by mouth every 6 (six) hours as needed., Disp: , Rfl:  .  Multiple Vitamin (ONE-A-DAY MENS PO), Take 1 tablet by mouth daily., Disp: , Rfl:  .  predniSONE (DELTASONE) 20 MG tablet, Take 20 mg by mouth daily with breakfast. 2 tabs daily for 1 month, then 1.5 tabs daily for 1 month, then 1 tab daily for 1 month., Disp: , Rfl:  .  Simethicone (GAS-X PO), Take 2 tablets by mouth 2 (two) times daily as needed (for  gas). , Disp: , Rfl:  .  sulfamethoxazole-trimethoprim (BACTRIM DS,SEPTRA DS) 800-160 MG per tablet, Take 1 tablet by mouth 3 (three) times a week., Disp: , Rfl:  .  pantoprazole (PROTONIX) 40 MG tablet, Take 1 tablet (40 mg total) by mouth daily. (Patient not taking: Reported on 11/19/2014), Disp: 60 tablet, Rfl: 0 .  traMADol (ULTRAM) 50 MG tablet, Take 1-2  tablets (50-100 mg total) by mouth every 6 (six) hours as needed (pain). (Patient not taking: Reported on 11/19/2014), Disp: 30 tablet, Rfl: 0     Objective:   Physical Exam  Constitutional: He is oriented to person, place, and time. He appears well-developed and well-nourished. No distress.  HENT:  Head: Normocephalic and atraumatic.  Right Ear: External ear normal.  Left Ear: External ear normal.  Mouth/Throat: Oropharynx is clear and moist. No oropharyngeal exudate.  Eyes: Conjunctivae and EOM are normal. Pupils are equal, round, and reactive to light. Right eye exhibits no discharge. Left eye exhibits no discharge. No scleral icterus.  Neck: Normal range of motion. Neck supple. No JVD present. No tracheal deviation present. No thyromegaly present.  Cardiovascular: Normal rate, regular rhythm and intact distal pulses.  Exam reveals no gallop and no friction rub.   No murmur heard. Pulmonary/Chest: Effort normal and breath sounds normal. No respiratory distress. He has no wheezes. He has no rales. He exhibits no tenderness.  Abdominal: Soft. Bowel sounds are normal. He exhibits no distension and no mass. There is no tenderness. There is no rebound and no guarding.  Musculoskeletal: Normal range of motion. He exhibits no edema or tenderness.  Lymphadenopathy:    He has no cervical adenopathy.  Neurological: He is alert and oriented to person, place, and time. He has normal reflexes. No cranial nerve deficit. Coordination normal.  Mild ataxic gait +  Skin: Skin is warm and dry. No rash noted. He is not diaphoretic. No erythema. No pallor.  Psychiatric: He has a normal mood and affect. His behavior is normal. Judgment and thought content normal.  Nursing note and vitals reviewed.   Filed Vitals:   11/19/14 1641  BP: 170/92  Pulse: 92  Height: 5' 6.5" (1.689 m)  Weight: 197 lb (89.359 kg)  SpO2: 98%         Assessment & Plan:     ICD-9-CM ICD-10-CM   1. Mediastinal  lymphadenopathy 785.6 R59.9   2. Neurosarcoidosis in adult 135 D86.89    - glad respiratory wise you are doing well - for ataxia and neural pain  - agree with PT   - try Dr. Camillia Herter, [place] [facility]   Address: [address] [place], [place] [address]   [phone] -- agree and support Dr  Pearline Cables decision to start you on INFLIXIMAB and continue prednisone  - please give my nurse DR GRay contact so we can send him my note  Followup   3 months or sooner if needed    > 50% of this > 25 min visit spent in face to face counseling or coordination of care (15 min visit converted to 25 min)   Dr. Brand Males, M.D., [facility].C.P Pulmonary and Critical Care Medicine Staff Physician [facility] Pager: [phone], If no answer or between  15:00h - 7:00h: call [phone]  11/19/2014 6:10 PM

## 2014-11-20 ENCOUNTER — Other Ambulatory Visit: Payer: Self-pay | Admitting: Internal Medicine

## 2014-11-20 ENCOUNTER — Other Ambulatory Visit: Payer: Self-pay | Admitting: Thoracic Surgery (Cardiothoracic Vascular Surgery)

## 2014-11-22 NOTE — Telephone Encounter (Signed)
OV note routed to Dr. Pearline Cables. Nothing further needed.

## 2014-11-24 ENCOUNTER — Encounter: Payer: Self-pay | Admitting: Thoracic Surgery (Cardiothoracic Vascular Surgery)

## 2014-11-25 ENCOUNTER — Telehealth: Payer: Self-pay | Admitting: Internal Medicine

## 2014-11-25 ENCOUNTER — Encounter: Payer: Self-pay | Admitting: Thoracic Surgery (Cardiothoracic Vascular Surgery)

## 2014-11-25 LAB — AFB CULTURE WITH SMEAR (NOT AT ARMC): Acid Fast Smear: NONE SEEN

## 2014-11-25 NOTE — Telephone Encounter (Signed)
Called spoke with pt. Made him aware his nurse sent notes over to Dr. Dominica Severin on 11/22/14. Nothing further needed

## 2014-12-01 ENCOUNTER — Telehealth: Payer: Self-pay | Admitting: Neurology

## 2014-12-01 ENCOUNTER — Other Ambulatory Visit: Payer: Self-pay

## 2014-12-01 NOTE — Telephone Encounter (Signed)
The patient has had MRI brain evaluation on 10/20/2014 showing multiple white matter lesions in the corpus callosum and periventricular white matter consistent. MRI of the cervical and thoracic spinal cord shows a longitudinally extensive spinal cord lesion with expansion, edema, and enhancement. The diagnosis of neurosarcoidosis should be considered.

## 2014-12-02 ENCOUNTER — Telehealth: Payer: Self-pay | Admitting: Neurology

## 2014-12-02 ENCOUNTER — Ambulatory Visit: Payer: 59 | Admitting: Neurology

## 2014-12-02 NOTE — Telephone Encounter (Signed)
This patient did not show for a revisit appointment today. 

## 2014-12-08 ENCOUNTER — Telehealth: Payer: Self-pay | Admitting: Internal Medicine

## 2014-12-08 NOTE — Telephone Encounter (Signed)
Try  take levaquin 500mg  once daily  X 6 days During this time hold off bactrim

## 2014-12-08 NOTE — Telephone Encounter (Signed)
lmomtcb x1 

## 2014-12-08 NOTE — Telephone Encounter (Signed)
Pt feels like he is catching a cold for past 4 days.  C/o sinus drainage (yellow), occas prod cough(clear), chest tightness and some increased sob.  Please advise.  No Known Allergies  Current Outpatient Prescriptions on File Prior to Visit  Medication Sig Dispense Refill  . acetaminophen (TYLENOL) 500 MG tablet Take 750 mg by mouth every 6 (six) hours as needed for mild pain.     Marland Kitchen albuterol (PROVENTIL HFA;VENTOLIN HFA) 108 (90 BASE) MCG/ACT inhaler Inhale 2 puffs into the lungs every 6 (six) hours as needed for wheezing or shortness of breath. 1 Inhaler 6  . ALPRAZolam (XANAX) 0.5 MG tablet Take 1 tablet (0.5 mg total) by mouth at bedtime as needed for anxiety. 20 tablet 0  . gabapentin (NEURONTIN) 300 MG capsule Take 1 capsule (300 mg total) by mouth 3 (three) times daily. 90 capsule 3  . ibuprofen (ADVIL,MOTRIN) 200 MG tablet Take 200 mg by mouth every 6 (six) hours as needed.    . Multiple Vitamin (ONE-A-DAY MENS PO) Take 1 tablet by mouth daily.    . pantoprazole (PROTONIX) 40 MG tablet Take 1 tablet (40 mg total) by mouth daily. (Patient not taking: Reported on 11/19/2014) 60 tablet 0  . predniSONE (DELTASONE) 20 MG tablet Take 20 mg by mouth daily with breakfast. 2 tabs daily for 1 month, then 1.5 tabs daily for 1 month, then 1 tab daily for 1 month.    . Simethicone (GAS-X PO) Take 2 tablets by mouth 2 (two) times daily as needed (for gas).     Marland Kitchen sulfamethoxazole-trimethoprim (BACTRIM DS,SEPTRA DS) 800-160 MG per tablet Take 1 tablet by mouth 3 (three) times a week.    . traMADol (ULTRAM) 50 MG tablet Take 1-2 tablets (50-100 mg total) by mouth every 6 (six) hours as needed (pain). (Patient not taking: Reported on 11/19/2014) 30 tablet 0   No current facility-administered medications on file prior to visit.

## 2014-12-09 MED ORDER — LEVOFLOXACIN 500 MG PO TABS: 500.0000 mg | ORAL_TABLET | Freq: Every day | ORAL | Status: DC

## 2014-12-09 NOTE — Telephone Encounter (Signed)
Called and spoke to pt. Informed pt of the recs per MR. Rx sent to preferred pharmacy. Pt aware to hold Bactrim while on Levaquin. Pt verbalized understanding and denied any further questions or concerns at this time.

## 2014-12-30 ENCOUNTER — Ambulatory Visit (INDEPENDENT_AMBULATORY_CARE_PROVIDER_SITE_OTHER): Payer: 59 | Admitting: Internal Medicine

## 2014-12-30 ENCOUNTER — Encounter: Payer: Self-pay | Admitting: Internal Medicine

## 2014-12-30 VITALS — BP 156/90 | HR 90 | Ht 66.5 in | Wt 194.0 lb

## 2014-12-30 DIAGNOSIS — R590 Localized enlarged lymph nodes: Secondary | ICD-10-CM

## 2014-12-30 DIAGNOSIS — D8689 Sarcoidosis of other sites: Secondary | ICD-10-CM

## 2014-12-30 DIAGNOSIS — R599 Enlarged lymph nodes, unspecified: Secondary | ICD-10-CM

## 2014-12-30 NOTE — Progress Notes (Signed)
Subjective:    Patient ID: Gregory Fuller, male    DOB: 03-Feb-1981, 34 y.o.   MRN: [id number]  HPI   OV 12/30/2014  Chief Complaint  Patient presents with  . Follow-up    Pt c/o increaes in SOB, weakness, mild tremors, extremities tingling, left sided chest tightness that radiated to right chest that all presented this morning. Pt stated he rested and his symptoms resolved but still had the left sided chest pain.    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 At that time the plan was for him to continue with steroid and TNF alpha blockade treatment through [facility]. The plan was to see him back around April 2016. He returns acutely today because he says in the interim he got laid off from his job. He did apply to a new job today. He does not think he is particularly anxious but then in the middle of  shower as he got out he started noticing sudden onset of acute on chronic tingling office hands and feet associated with some chest tightness and tremulousness but he says he was not hyperventilating all that he did feel cold. Episode lasted a few minutes and then resolved. He got scared that this was due to worsening neurosarcoidosis and pulmonary sarcoidosis and therefore he is here. There is no edema or syncope or chest pain or fall or hemoptysis or chills or ridgorss. No prior history of panic attacks   Off note on 12/06/2014 he did see Dr. Carroll Kinds his neurologist at [facility]. Plan was to try start him on TNF alpha blockade Remicade but appears on 12/13/2014 this position was changed to Rituxan which he says he got one dose a week or so ago at the 60 mg. He tolerated the infusion fine. It is unclear to me why the switch was made. He thinks this is because the suspicion is that he might not have neurosarcoidosis.    has a past medical history of Asthma; Subacute transverse myelitis; Bronchitis; and GERD (gastroesophageal  reflux disease).   reports that he has never smoked. He has never used smokeless tobacco.  Past Surgical History  Procedure Laterality Date  . No past surgeries    . Video bronchoscopy with endobronchial ultrasound N/A 09/08/2014    Procedure: VIDEO BRONCHOSCOPY WITH ENDOBRONCHIAL ULTRASOUND;  Surgeon: Brand Males, MD;  Location: [place];  Service: Thoracic;  Laterality: N/A;  . Mediastinoscopy N/A 10/13/2014    Procedure: MEDIASTINOSCOPY;  Surgeon: Melrose Nakayama, MD;  Location: [place];  Service: Thoracic;  Laterality: N/A;  . Lymph node biopsy N/A 10/13/2014    Procedure: LYMPH NODE BIOPSY;  Surgeon: Melrose Nakayama, MD;  Location: [place];  Service: Thoracic;  Laterality: N/A;    No Known Allergies  Immunization History  Administered Date(s) Administered  . Influenza,inj,Quad PF,36+ Mos 08/09/2014    Family History  Problem Relation Age of Onset  . Diabetes Other   . Thyroid disease Mother   . Seizures Father      Current outpatient prescriptions:  .  acetaminophen (TYLENOL) 500 MG tablet, Take 750 mg by mouth every 6 (six) hours as needed for mild pain. , Disp: , Rfl:  .  albuterol (PROVENTIL HFA;VENTOLIN HFA) 108 (90 BASE) MCG/ACT inhaler, Inhale 2 puffs into the lungs every 6 (six) hours as needed for wheezing or shortness of breath., Disp: 1 Inhaler, Rfl: 6 .  ALPRAZolam (XANAX) 0.5 MG tablet, Take 1  tablet (0.5 mg total) by mouth at bedtime as needed for anxiety., Disp: 20 tablet, Rfl: 0 .  gabapentin (NEURONTIN) 300 MG capsule, Take 1 capsule (300 mg total) by mouth 3 (three) times daily., Disp: 90 capsule, Rfl: 3 .  ibuprofen (ADVIL,MOTRIN) 200 MG tablet, Take 200 mg by mouth every 6 (six) hours as needed., Disp: , Rfl:  .  levofloxacin (LEVAQUIN) 500 MG tablet, Take 1 tablet (500 mg total) by mouth daily., Disp: 5 tablet, Rfl: 0 .  Multiple Vitamin (ONE-A-DAY MENS PO), Take 1 tablet by mouth daily., Disp: , Rfl:  .  pantoprazole (PROTONIX) 40 MG tablet, Take 1  tablet (40 mg total) by mouth daily., Disp: 60 tablet, Rfl: 0 .  predniSONE (DELTASONE) 20 MG tablet, Take 20 mg by mouth daily with breakfast. 2 tabs daily for 1 month, then 1.5 tabs daily for 1 month, then 1 tab daily for 1 month., Disp: , Rfl:  .  RiTUXimab (RITUXAN IV), Inject into the vein every 21 ( twenty-one) days., Disp: , Rfl:  .  Simethicone (GAS-X PO), Take 2 tablets by mouth 2 (two) times daily as needed (for gas). , Disp: , Rfl:  .  sulfamethoxazole-trimethoprim (BACTRIM DS,SEPTRA DS) 800-160 MG per tablet, Take 1 tablet by mouth 3 (three) times a week., Disp: , Rfl:      Review of Systems  Constitutional: Negative for fever and unexpected weight change.  HENT: Negative for congestion, dental problem, ear pain, nosebleeds, postnasal drip, rhinorrhea, sinus pressure, sneezing, sore throat and trouble swallowing.   Eyes: Negative for redness and itching.  Respiratory: Positive for shortness of breath. Negative for cough, chest tightness and wheezing.   Cardiovascular: Negative for palpitations and leg swelling.  Gastrointestinal: Negative for nausea and vomiting.  Genitourinary: Negative for dysuria.  Musculoskeletal: Negative for joint swelling.  Skin: Negative for rash.  Neurological: Negative for headaches.  Hematological: Does not bruise/bleed easily.  Psychiatric/Behavioral: Negative for dysphoric mood. The patient is not nervous/anxious.        Objective:   Physical Exam  Filed Vitals:   12/30/14 1556  BP: 156/90  Pulse: 90  Height: 5' 6.5" (1.689 m)  Weight: 194 lb (87.998 kg)  SpO2: 98%     Constitutional: He is oriented to person, place, and time. He appears well-developed and well-nourished. No distress.  HENT:  Head: Normocephalic and atraumatic.  Right Ear: External ear normal.  Left Ear: External ear normal.  Mouth/Throat: Oropharynx is clear and moist. No oropharyngeal exudate.  Eyes: Conjunctivae and EOM are normal. Pupils are equal, round, and  reactive to light. Right eye exhibits no discharge. Left eye exhibits no discharge. No scleral icterus.  Neck: Normal range of motion. Neck supple. No JVD present. No tracheal deviation present. No thyromegaly present.  Cardiovascular: Normal rate, regular rhythm and intact distal pulses.  Exam reveals no gallop and no friction rub.   No murmur heard. Pulmonary/Chest: Effort normal and breath sounds normal. No respiratory distress. He has no wheezes. He has no rales. He exhibits no tenderness.  Abdominal: Soft. Bowel sounds are normal. He exhibits no distension and no mass. There is no tenderness. There is no rebound and no guarding.  Musculoskeletal: Normal range of motion. He exhibits no edema or tenderness.  Lymphadenopathy:    He has no cervical adenopathy.  Neurological: He is alert and oriented to person, place, and time. He has normal reflexes. No cranial nerve deficit. Coordination normal.  Mild ataxic gait + - unchnaged in my  subjective objective opinion, improved per his subjective opinion Skin: Skin is warm and dry. No rash noted. He is not diaphoretic. No erythema. No pallor.  Psychiatric: He has a normal mood and affect. His behavior is normal. Judgment and thought content normal.  Nursing note and vitals reviewed.      Assessment & Plan:     ICD-9-CM ICD-10-CM   1. Mediastinal lymphadenopathy 785.6 R59.9   2. Neurosarcoidosis in adult 135 D86.89     -= Not sure what the cause of acute episode today was. It could be panic attack as a best case scenario. Worse case scenario and could be impingement of his neurologic lesion on the spine. Currently he is neurologically intact and at baseline. I've asked him to observe this and if it recurs or gets worse he should call his neurologist are reported to the urgent care. Asked him to watch for urinary or stool incontinence as well. It's a good idea to get a MRI if his symptoms get worse he did he is due for an MRI at [facility] in March  2016 according to his history  We'll send note to his neurologist Dr. Carroll Kinds   Dr. Brand Males, M.D., [facility].C.P Pulmonary and Critical Care Medicine Staff Physician [place] [facility] Pager: [phone], If no answer or between  15:00h - 7:00h: call [phone]  12/30/2014 4:41 PM

## 2014-12-30 NOTE — Patient Instructions (Addendum)
ICD-9-CM ICD-10-CM   1. Mediastinal lymphadenopathy 785.6 R59.9   2. Neurosarcoidosis in adult 135 D86.89     Unclear cause of tingling and chest tightness Keep up MRI appt at [facility] in march 2016  Followup  change to 3 months from 12/30/2014  any respiratoryt  problems call or return sooner Any neuro problems call your neurologist

## 2015-01-03 ENCOUNTER — Telehealth: Payer: Self-pay | Admitting: Internal Medicine

## 2015-01-03 NOTE — Telephone Encounter (Signed)
Left message to call back  

## 2015-01-04 NOTE — Telephone Encounter (Signed)
lmtcb x2 

## 2015-01-05 NOTE — Telephone Encounter (Signed)
Agree with you. I do not know enough about the cndition to know if MS or sarcoid. HE should follow his neuro opinion. I am here for support

## 2015-01-05 NOTE — Telephone Encounter (Signed)
[phone], pt cb

## 2015-01-05 NOTE — Telephone Encounter (Signed)
Called and spoke to pt. Pt stated his neurologist is leaning towards a dx of MS instead of neurosarcoidosis. Advised pt to continue with current therapy and if any meds are changed or treatment is changed then to call us back. Pt verbalized understanding and denied any further questions or concerns at this time.   Will forward to MR as FYI.

## 2015-01-05 NOTE — Telephone Encounter (Signed)
lmtcb x1 

## 2015-01-05 NOTE — Telephone Encounter (Signed)
Pt is aware of MR's recommendation. Nothing further was needed.

## 2015-01-06 ENCOUNTER — Emergency Department (HOSPITAL_COMMUNITY): Payer: 59

## 2015-01-06 ENCOUNTER — Encounter (HOSPITAL_COMMUNITY): Payer: Self-pay | Admitting: *Deleted

## 2015-01-06 ENCOUNTER — Emergency Department (HOSPITAL_COMMUNITY)
Admission: EM | Admit: 2015-01-06 | Discharge: 2015-01-06 | Disposition: A | Discharge status: Home / Self Care | Payer: 59 | Attending: Emergency Medicine | Admitting: Emergency Medicine

## 2015-01-06 DIAGNOSIS — Z7952 Long term (current) use of systemic steroids: Secondary | ICD-10-CM | POA: Insufficient documentation

## 2015-01-06 DIAGNOSIS — Z791 Long term (current) use of non-steroidal anti-inflammatories (NSAID): Secondary | ICD-10-CM | POA: Insufficient documentation

## 2015-01-06 DIAGNOSIS — K219 Gastro-esophageal reflux disease without esophagitis: Secondary | ICD-10-CM | POA: Insufficient documentation

## 2015-01-06 DIAGNOSIS — R079 Chest pain, unspecified: Secondary | ICD-10-CM

## 2015-01-06 DIAGNOSIS — J45901 Unspecified asthma with (acute) exacerbation: Secondary | ICD-10-CM | POA: Insufficient documentation

## 2015-01-06 DIAGNOSIS — Z79899 Other long term (current) drug therapy: Secondary | ICD-10-CM | POA: Insufficient documentation

## 2015-01-06 DIAGNOSIS — D860 Sarcoidosis of lung: Secondary | ICD-10-CM

## 2015-01-06 DIAGNOSIS — Z792 Long term (current) use of antibiotics: Secondary | ICD-10-CM | POA: Insufficient documentation

## 2015-01-06 HISTORY — DX: Sarcoidosis, unspecified: D86.9

## 2015-01-06 LAB — I-STAT TROPONIN, ED: Troponin i, poc: 0 ng/mL (ref 0.00–0.08)

## 2015-01-06 LAB — BASIC METABOLIC PANEL
ANION GAP: 7 (ref 5–15)
BUN: 17 mg/dL (ref 6–23)
CHLORIDE: 102 mmol/L (ref 96–112)
CO2: 26 mmol/L (ref 19–32)
Calcium: 9.1 mg/dL (ref 8.4–10.5)
Creatinine, Ser: 0.96 mg/dL (ref 0.50–1.35)
GFR calc Af Amer: >90 mL/min (ref >90)
GFR calc non Af Amer: >90 mL/min (ref >90)
Glucose, Bld: 116 mg/dL — ABNORMAL HIGH (ref 70–99)
POTASSIUM: 3.7 mmol/L (ref 3.5–5.1)
Sodium: 135 mmol/L (ref 135–145)

## 2015-01-06 LAB — CBC WITH DIFFERENTIAL/PLATELET
Basophils Absolute: 0 10*3/uL (ref 0.0–0.1)
Basophils Relative: 0 % (ref 0–1)
Eosinophils Absolute: 0 10*3/uL (ref 0.0–0.7)
Eosinophils Relative: 0 % (ref 0–5)
HEMATOCRIT: 41.3 % (ref 39.0–52.0)
Hemoglobin: 14.3 g/dL (ref 13.0–17.0)
LYMPHS ABS: 0.8 10*3/uL (ref 0.7–4.0)
Lymphocytes Relative: 9 % — ABNORMAL LOW (ref 12–46)
MCH: 27.8 pg (ref 26.0–34.0)
MCHC: 34.6 g/dL (ref 30.0–36.0)
MCV: 80.4 fL (ref 78.0–100.0)
Monocytes Absolute: 0.6 10*3/uL (ref 0.1–1.0)
Monocytes Relative: 8 % (ref 3–12)
NEUTROS PCT: 83 % — AB (ref 43–77)
Neutro Abs: 7.1 10*3/uL (ref 1.7–7.7)
Platelets: 222 10*3/uL (ref 150–400)
RBC: 5.14 MIL/uL (ref 4.22–5.81)
RDW: 14.6 % (ref 11.5–15.5)
WBC: 8.6 10*3/uL (ref 4.0–10.5)

## 2015-01-06 MED ORDER — IPRATROPIUM BROMIDE 0.02 % IN SOLN
0.5000 mg | Freq: Once | RESPIRATORY_TRACT | Status: AC
  Administered 2015-01-06: 0.5 mg via RESPIRATORY_TRACT
  Filled 2015-01-06: qty 2.5

## 2015-01-06 MED ORDER — DEXAMETHASONE SODIUM PHOSPHATE 10 MG/ML IJ SOLN
10.0000 mg | Freq: Once | INTRAMUSCULAR | Status: AC
  Administered 2015-01-06: 10 mg via INTRAVENOUS
  Filled 2015-01-06: qty 1

## 2015-01-06 MED ORDER — ALBUTEROL SULFATE (2.5 MG/3ML) 0.083% IN NEBU
5.0000 mg | INHALATION_SOLUTION | Freq: Once | RESPIRATORY_TRACT | Status: AC
  Administered 2015-01-06: 5 mg via RESPIRATORY_TRACT
  Filled 2015-01-06: qty 6

## 2015-01-06 NOTE — ED Provider Notes (Signed)
CSN: [id number]     Arrival date & time 01/06/15  0126 History   First MD Initiated Contact with Patient 01/06/15 [phone]     Chief Complaint  Patient presents with  . Chest Pain     (Consider location/radiation/quality/duration/timing/severity/associated sxs/prior Treatment) HPI Comments: Patient is a 34 year old male with a past medical history of sarcoidosis who presents with chest pain for the past 2 days.   Patient is a 34 y.o. male presenting with chest pain. The history is provided by the patient. No language interpreter was used.  Chest Pain Pain location:  Substernal area Pain quality: tightness   Pain radiates to:  Does not radiate Pain radiates to the back: yes   Pain severity:  Moderate Onset quality:  Gradual Duration:  2 days Timing:  Constant Progression:  Worsening Chronicity:  Recurrent Context: breathing   Context comment:  History of pulmonary sarcoidosis  Relieved by:  Nothing Worsened by:  Exertion Ineffective treatments:  Rest Associated symptoms: shortness of breath   Associated symptoms: no abdominal pain, no dizziness, no dysphagia, no fatigue, no fever, no nausea, no palpitations, not vomiting and no weakness   Shortness of breath:    Severity:  Moderate   Onset quality:  Gradual   Duration:  2 days   Timing:  Constant   Progression:  Worsening Risk factors: male sex   Risk factors: no aortic disease, no birth control, no coronary artery disease, no diabetes mellitus, no Ehlers-Danlos syndrome, no high cholesterol, no hypertension, no immobilization and no Marfan's syndrome     Past Medical History  Diagnosis Date  . Asthma   . Subacute transverse myelitis   . Bronchitis   . GERD (gastroesophageal reflux disease)   . Sarcoidosis    Past Surgical History  Procedure Laterality Date  . No past surgeries    . Video bronchoscopy with endobronchial ultrasound N/A 09/08/2014    Procedure: VIDEO BRONCHOSCOPY WITH ENDOBRONCHIAL ULTRASOUND;  Surgeon:  Brand Males, MD;  Location: [place];  Service: Thoracic;  Laterality: N/A;  . Mediastinoscopy N/A 10/13/2014    Procedure: MEDIASTINOSCOPY;  Surgeon: Melrose Nakayama, MD;  Location: [place];  Service: Thoracic;  Laterality: N/A;  . Lymph node biopsy N/A 10/13/2014    Procedure: LYMPH NODE BIOPSY;  Surgeon: Melrose Nakayama, MD;  Location: [facility] OR;  Service: Thoracic;  Laterality: N/A;   Family History  Problem Relation Age of Onset  . Diabetes Other   . Thyroid disease Mother   . Seizures Father    History  Substance Use Topics  . Smoking status: Never Smoker   . Smokeless tobacco: Never Used     Comment: pt states he smoked socially  . Alcohol Use: Yes     Comment: occassional weekends    Review of Systems  Constitutional: Negative for fever, chills and fatigue.  HENT: Negative for trouble swallowing.   Eyes: Negative for visual disturbance.  Respiratory: Positive for shortness of breath.   Cardiovascular: Positive for chest pain. Negative for palpitations.  Gastrointestinal: Negative for nausea, vomiting, abdominal pain and diarrhea.  Genitourinary: Negative for dysuria and difficulty urinating.  Musculoskeletal: Negative for arthralgias and neck pain.  Skin: Negative for color change.  Neurological: Negative for dizziness and weakness.  Psychiatric/Behavioral: Negative for dysphoric mood.      Allergies  Review of patient's allergies indicates no known allergies.  Home Medications   Prior to Admission medications   Medication Sig Start Date End Date Taking? Authorizing Provider  acetaminophen (TYLENOL) 500 MG tablet Take 750 mg by mouth every 6 (six) hours as needed for mild pain.    Yes Historical Provider, MD  albuterol (PROVENTIL HFA;VENTOLIN HFA) 108 (90 BASE) MCG/ACT inhaler Inhale 2 puffs into the lungs every 6 (six) hours as needed for wheezing or shortness of breath. 09/08/14  Yes Brand Males, MD  ALPRAZolam Duanne Moron) 0.5 MG tablet Take 1 tablet (0.5  mg total) by mouth at bedtime as needed for anxiety. 11/03/14  Yes Charlesetta Shanks, MD  gabapentin (NEURONTIN) 300 MG capsule Take 1 capsule (300 mg total) by mouth 3 (three) times daily. 11/08/14  Yes Kathrynn Ducking, MD  ibuprofen (ADVIL,MOTRIN) 200 MG tablet Take 200 mg by mouth every 6 (six) hours as needed.   Yes Historical Provider, MD  Multiple Vitamin (ONE-A-DAY MENS PO) Take 1 tablet by mouth daily.   Yes Historical Provider, MD  pantoprazole (PROTONIX) 40 MG tablet Take 1 tablet (40 mg total) by mouth daily. 08/09/14  Yes Reyne Dumas, MD  predniSONE (DELTASONE) 20 MG tablet Take 20 mg by mouth daily with breakfast. 2 tabs daily for 1 month, then 1.5 tabs daily for 1 month, then 1 tab daily for 1 month.   Yes Historical Provider, MD  PRESCRIPTION MEDICATION Place 2 drops into both eyes 2 (two) times daily. Eye drop for a sty   Yes Historical Provider, MD  Pseudoeph-Doxylamine-DM-APAP (NYQUIL PO) Take 30 mLs by mouth at bedtime as needed (flu like symptoms).   Yes Historical Provider, MD  sulfamethoxazole-trimethoprim (BACTRIM DS,SEPTRA DS) 800-160 MG per tablet Take 1 tablet by mouth 3 (three) times a week.   Yes Historical Provider, MD  levofloxacin (LEVAQUIN) 500 MG tablet Take 1 tablet (500 mg total) by mouth daily. Patient not taking: Reported on 01/06/2015 12/09/14   Brand Males, MD  RiTUXimab (RITUXAN IV) Inject into the vein every 21 ( twenty-one) days.    Historical Provider, MD   BP 152/87 mmHg  Pulse 64  Temp(Src) 98.3 F (36.8 C) (Oral)  Resp 20  SpO2 100% Physical Exam  Constitutional: He is oriented to person, place, and time. He appears well-developed and well-nourished. No distress.  HENT:  Head: Normocephalic and atraumatic.  Eyes: Conjunctivae and EOM are normal.  Neck: Normal range of motion.  Cardiovascular: Normal rate and regular rhythm.  Exam reveals no gallop and no friction rub.   No murmur heard. Pulmonary/Chest: Effort normal and breath sounds normal. He  has no wheezes. He has no rales. He exhibits no tenderness.  Abdominal: Soft. There is no tenderness.  Musculoskeletal: Normal range of motion.  Neurological: He is alert and oriented to person, place, and time. Coordination normal.  Speech is goal-oriented. Moves limbs without ataxia.   Skin: Skin is warm and dry.  Psychiatric: He has a normal mood and affect. His behavior is normal.  Nursing note and vitals reviewed.   ED Course  Procedures (including critical care time) Labs Review Labs Reviewed  CBC WITH DIFFERENTIAL/PLATELET - Abnormal; Notable for the following:    Neutrophils Relative % 83 (*)    Lymphocytes Relative 9 (*)    All other components within normal limits  BASIC METABOLIC PANEL - Abnormal; Notable for the following:    Glucose, Bld 116 (*)    All other components within normal limits  I-STAT TROPOININ, ED    Imaging Review Dg Chest 2 View  01/06/2015   CLINICAL DATA:  Acute onset of chest tightness for 2 days. Shortness of breath. Initial  encounter.  EXAM: CHEST  2 VIEW  COMPARISON:  Chest radiograph performed 10/11/2014  FINDINGS: The lungs are well-aerated and clear. There is no evidence of focal opacification, pleural effusion or pneumothorax.  The heart is normal in size; the mediastinal contour is within normal limits. No acute osseous abnormalities are seen.  IMPRESSION: No acute cardiopulmonary process seen.   Electronically Signed   By: Garald Balding M.D.   On: 01/06/2015 03:05     EKG Interpretation   Date/Time:  Thursday January 06 2015 01:36:50 EST Ventricular Rate:  61 PR Interval:  138 QRS Duration: 80 QT Interval:  376 QTC Calculation: 379 R Axis:   69 Text Interpretation:  Sinus rhythm LVH by voltage When compared with ECG  of 11/03/2014, HEART RATE has decreased Confirmed by [facility]  MD, DAVID  ([id number]) on 01/06/2015 1:42:45 AM      MDM   Final diagnoses:  Sarcoidosis of lung    5:33 AM Patient reports improvement in symptoms and is  ready for discharge. Vitals stable and patient afebrile. Chest xray, labs and EKG unremarkable. Patient likely having exacerbation of sarcoidosis.    [address] [place], PA-C 75/17/00 1749  Delora Fuel, MD 44/96/75 [id number]

## 2015-01-06 NOTE — ED Notes (Signed)
Pt states that he has had chest tightness for 2 days; pt states that the tightness has gotten progressively worse tonight; pt c/o feeling short of breath and states that the tightness radiates to his back

## 2015-01-06 NOTE — Discharge Instructions (Signed)
Continue to take you prednisone as prescribed. Follow up with you doctor. Refer to attached documents for more information. Return to the ED with worsening or concerning symptoms.

## 2015-01-26 ENCOUNTER — Telehealth: Payer: Self-pay | Admitting: Internal Medicine

## 2015-01-26 NOTE — Telephone Encounter (Signed)
Patient had MRI done at [facility] ([facility]) wants to know if Sarcoidosis was found on the MRI results? Patient says he has a lot of tightness in his chest.    MR - please advise.

## 2015-01-27 NOTE — Telephone Encounter (Signed)
He had MRI spine mid march 2016 at [facility] - results say MRI brain has findings of multiple sclerosis but MRI spike is actually improved compared to earlier. Do not know how/what all this means. He needs to continue to communicate with neurology

## 2015-01-27 NOTE — Telephone Encounter (Signed)
lmomtcb x1 

## 2015-01-31 NOTE — Telephone Encounter (Signed)
Pt is aware of MR's interpretation of his MRI. Nothing further was needed.

## 2015-02-09 ENCOUNTER — Telehealth: Payer: Self-pay | Admitting: Internal Medicine

## 2015-02-09 MED ORDER — AZITHROMYCIN 250 MG PO TABS: 250.0000 mg | ORAL_TABLET | ORAL | Status: DC

## 2015-02-09 MED ORDER — PREDNISONE 10 MG PO TABS: ORAL_TABLET | ORAL | Status: DC

## 2015-02-09 NOTE — Telephone Encounter (Signed)
HE can try  Please take prednisone 40 mg x1 day, then 30 mg x1 day, then 20 mg x1 day, then 10 mg x1 day, and then 5 mg x1 day and go back to baseline  Z pak  If this does not help - go to ER  Dr. Brand Males, M.D., [facility].C.P Pulmonary and Critical Care Medicine Staff Physician [place] [facility] Pager: [phone], If no answer or between  15:00h - 7:00h: call [phone]  02/09/2015 5:12 PM          Current outpatient prescriptions:  .  acetaminophen (TYLENOL) 500 MG tablet, Take 750 mg by mouth every 6 (six) hours as needed for mild pain. , Disp: , Rfl:  .  albuterol (PROVENTIL HFA;VENTOLIN HFA) 108 (90 BASE) MCG/ACT inhaler, Inhale 2 puffs into the lungs every 6 (six) hours as needed for wheezing or shortness of breath., Disp: 1 Inhaler, Rfl: 6 .  ALPRAZolam (XANAX) 0.5 MG tablet, Take 1 tablet (0.5 mg total) by mouth at bedtime as needed for anxiety., Disp: 20 tablet, Rfl: 0 .  gabapentin (NEURONTIN) 300 MG capsule, Take 1 capsule (300 mg total) by mouth 3 (three) times daily., Disp: 90 capsule, Rfl: 3 .  ibuprofen (ADVIL,MOTRIN) 200 MG tablet, Take 200 mg by mouth every 6 (six) hours as needed., Disp: , Rfl:  .  levofloxacin (LEVAQUIN) 500 MG tablet, Take 1 tablet (500 mg total) by mouth daily. (Patient not taking: Reported on 01/06/2015), Disp: 5 tablet, Rfl: 0 .  Multiple Vitamin (ONE-A-DAY MENS PO), Take 1 tablet by mouth daily., Disp: , Rfl:  .  pantoprazole (PROTONIX) 40 MG tablet, Take 1 tablet (40 mg total) by mouth daily., Disp: 60 tablet, Rfl: 0 .  predniSONE (DELTASONE) 20 MG tablet, Take 20 mg by mouth daily with breakfast. 2 tabs daily for 1 month, then 1.5 tabs daily for 1 month, then 1 tab daily for 1 month., Disp: , Rfl:  .  PRESCRIPTION MEDICATION, Place 2 drops into both eyes 2 (two) times daily. Eye drop for a sty, Disp: , Rfl:  .  Pseudoeph-Doxylamine-DM-APAP (NYQUIL PO), Take 30 mLs by mouth at bedtime as needed  (flu like symptoms)., Disp: , Rfl:  .  RiTUXimab (RITUXAN IV), Inject into the vein every 21 ( twenty-one) days., Disp: , Rfl:  .  sulfamethoxazole-trimethoprim (BACTRIM DS,SEPTRA DS) 800-160 MG per tablet, Take 1 tablet by mouth 3 (three) times a week., Disp: , Rfl:

## 2015-02-09 NOTE — Telephone Encounter (Signed)
Spoke with patient, aware that Rx for Pred taper and ZPAK is being sent to pharmacy.  Pt aware of rec's per MR regarding Prednisone.  Pt states that he been taken off the Prednisone x 2 weeks ago by Neurologist, MD wanted to ween him off the prednisone seeing that he is taking Rituxan IV.  Pt states that he will not have a baseline to go back to once Pred Taper is complete. Is this okay? Mr-pls advise thanks.  (pt aware that it may be tomorrow morning before he hears back from [place] about pred)

## 2015-02-09 NOTE — Telephone Encounter (Signed)
Spoke with pt, c/o increased sob, chest tightness, prod cough with white mucus.  Denies sinus congestion, chills, fever.  S/s present X2-3 days.   Pt has been taking tylenol to help with chest tightness, using rescue inhaler 1-2X daily.   Pt uses [facility].  MR please advise on recs.  Thanks!

## 2015-02-09 NOTE — Telephone Encounter (Signed)
Short taper and sthen stop is fine

## 2015-02-10 NOTE — Telephone Encounter (Signed)
Pt is aware that the prednisone will be a short taper. He wants to see MR but he has lost his insurance due to losing his job. Pt would like to know if MR could give him a call.  MR - please advise. Thanks.

## 2015-02-15 NOTE — Telephone Encounter (Signed)
MR please advise. Thanks! 

## 2015-02-18 NOTE — Telephone Encounter (Signed)
MR please advise if this has been done.  Thanks!

## 2015-02-28 NOTE — Telephone Encounter (Signed)
MR please advise if you have contact this patient. Thanks.

## 2015-03-02 NOTE — Telephone Encounter (Signed)
Per triage protocol, this message will be closed.

## 2015-03-25 ENCOUNTER — Ambulatory Visit: Payer: 59 | Admitting: Internal Medicine

## 2015-03-30 ENCOUNTER — Ambulatory Visit: Payer: Self-pay | Attending: Internal Medicine

## 2015-03-31 ENCOUNTER — Ambulatory Visit (INDEPENDENT_AMBULATORY_CARE_PROVIDER_SITE_OTHER): Payer: Self-pay | Admitting: Internal Medicine

## 2015-03-31 ENCOUNTER — Encounter: Payer: Self-pay | Admitting: Internal Medicine

## 2015-03-31 VITALS — BP 144/74 | HR 104 | Ht 66.5 in | Wt 189.0 lb

## 2015-03-31 DIAGNOSIS — R599 Enlarged lymph nodes, unspecified: Secondary | ICD-10-CM

## 2015-03-31 DIAGNOSIS — R590 Localized enlarged lymph nodes: Secondary | ICD-10-CM

## 2015-03-31 DIAGNOSIS — D8689 Sarcoidosis of other sites: Secondary | ICD-10-CM

## 2015-03-31 NOTE — Patient Instructions (Addendum)
ICD-9-CM ICD-10-CM   1. Mediastinal lymphadenopathy 785.6 R59.9   2. Neurosarcoidosis in adult 135 D86.89    We follow cllinically NO active intervention from me at this point  PLAN 9 months or sooner if needed

## 2015-03-31 NOTE — Progress Notes (Signed)
Subjective:    Patient ID: Gregory Fuller, male    DOB: 04-27-1981, 34 y.o.   MRN: [id number] PCP Nilda Simmer, MD  Carolan Shiver, MD - rheuma [address]  [place], [place] [address]  [phone]  [phone] (Fax)   Noni Saupe, MD  [address]  [id number] [facility]sciences  [place], [place] [address]  [phone]  [phone] (Fax)   Lawson Radar Matrunick (Attending) - neuro [phone] (Work) [phone] (Fax)  [address] [place], [place] [address]  HPI    OV 12/30/2014  Chief Complaint  Patient presents with  . Follow-up    Pt c/o increaes in SOB, weakness, mild tremors, extremities tingling, left sided chest tightness that radiated to right chest that all presented this morning. Pt stated he rested and his symptoms resolved but still had the left sided chest pain.    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 At that time the plan was for him to continue with steroid and TNF alpha blockade treatment through [facility]. The plan was to see him back around April 2016. He returns acutely today because he says in the interim he got laid off from his job. He did apply to a new job today. He does not think he is particularly anxious but then in the middle of  shower as he got out he started noticing sudden onset of acute on chronic tingling office hands and feet associated with some chest tightness and tremulousness but he says he was not hyperventilating all that he did feel cold. Episode lasted a few minutes and then resolved. He got scared that this was due to worsening neurosarcoidosis and pulmonary sarcoidosis and therefore he is here. There is no edema or syncope or chest pain or fall or hemoptysis or chills or ridgorss. No prior history of panic attacks   Off note on 12/06/2014 he did see Dr. Carroll Kinds his neurologist at [facility]. Plan was to try start him on TNF alpha blockade Remicade  but appears on 12/13/2014 this position was changed to Rituxan which he says he got one dose a week or so ago at the 60 mg. He tolerated the infusion fine. It is unclear to me why the switch was made. He thinks this is because the suspicion is that he might not have neurosarcoidosis.     OV 03/31/2015  Chief Complaint  Patient presents with  . Follow-up    Pt here for a 3 month f/u. Pt still c/o intermittent sharp pain in LUQ that radiates to left middle back. Pt denies cough and SOB.    FU early stage pulmonary sarcoidosis with spinal lesions thought to be neurosarcoidosis versus multiple sclerosis by [facility]  Last seen through 2016. Since then he has continued to follow-up at [facility]. He is on IV Rituxan infusions. These are going well. He feels that his balance is improved particularly with the help of physical therapy there. I did review the old chart and confirmed this. He says that his diagnosis is being changed back to neurosarcoidosis of the spine as a posterior multiple sclerosis. I reviewed both the rheumatology chart and neurology chart from [facility] and a documented below and it appears that the neurologist is leaning more towards neurosarcoidosis. The treatment plan is IV Rituxan. At some point he might go on methotrexate according to the chart. From a respirator standpoint he has no shortness of breath or wheezing or  cough or chest tightness or hemoptysis or orthopnea or paroxysmal nocturnal dyspnea or edema or fever or chills. Chest x-ray 01/06/2015 in our system is clear and it is documented below  On the other hand he continues to have left thoracic approximately T6-T8 left-sided radiculopathy for which neurology is maintaining him on gabapentin. He is frustrated by its persistence. He has not tried acupuncture due to loss of job.     LAB review cxr 01/06/15 - clear lung fields in our system - personally visualized imaged LABs - 01/06/15 - creat 0.96mg %, hgb 14gm%,  wbc 8.6K  CHART REVIEW  Last seen 03/07/2015 [facility] of rheumatology and his assessment and plan has been copied and pasted in blue verbatin "   " asymptomatic biopsy-proven pulmonary sarcoidodis and symptomatic brain and spinal CNS lesions concerning for multiple sclerosis and/or neurosarcoidosis. He has no signs or symptoms of active sarcoidosis today, and CXR indicates response of his asymptomatic pulmonary nodules to the steroids and/or Rituxan. His CNS lesions are also improving with the treatment. Dr. Felipe Drone is managing immunosuppression for his CNS lesions. The brain lesions are consistent with MS, and per my last conversation with Dr. Felipe Drone the spinal lesions are more consistent with MS than with neurosarcoidosis. It is possible that he has longstanding asymptomatic pulmonary sarcoidosis that was incidentally discovered when he developed multiple sclerosis, but his treatment needs to cover both multiple sclerosis and neurosarcoidosis. We discussed methotrexate side effects in case another steroid sparing agent is needed in the future for sarcoidosis, though there is no indication to start it now and it would not be the agent of choice for multiple sclerosis relapse.  Plan: 1) Pulmonary sarcoidosis with possible multiple sclerosis vs neurosarcoidosis -Check CBC w/ diff, AST, ALT, Cr -No need to start additional immunosuppression at this time.  -Important to avoid anti-TNF agents -Continue annual eye exams."    Last saw [place] [facility] neurology department on 03/14/2015 and instructed to continue Rituxan. She put dx as neurosarcoid    Past medical as from [facility] Sarcoidosis (RAF-HCC) - Primary  Biopsy-proven asymptomatic pulmonary sarcoidosis. Spinal cord lesions w/ inflamatory CSF that could be neurosarcoidosis or multiple sclerosis Diagnosed: 08/2014. Presented to OSH w/ to neurologic symptoms. CT chest showed hilar lymphadenopathy, MRI showed  brain & spinal lesions. Biopsy shows pulm sarcoid; CNS imaging is more consistent w/ multiple sclerosis  Disease activity/symptoms: Sensation of chest tightness (unclear if pulmonary sarcoid or due to CNS lesions). From CNS lesions: Neck and lower back pain, lower extremity paresthesias and impaired proprioception resulting in impaired balance, left upper extremity numbness Damage: -Outside PFT's in 10/15 to 12/15: reportedly "fine" -MRI brain w/ & w/o contrast (10/19/14): Multiple white matter lesions in a distribution consistent with multiple sclerosis.  -MRI cervical/thoracic spine (10/19/14): Longitudinally extensive cervical and thoracic myelopathy with expansion, edema and enhancement predominantly along the dorsal and dorsolateral cord, and minimal central cord enhancement. Unusual for multiple sclerosis. Possibly demyelinating.  -MRI brain (01/19/15): Stable lesions, consistent w/ MS -MRI cervical/thoracic spine (01/19/15): Improvement in inflamatory lesions. No new lesions. -CXR (03/03/15)- Hilar fullness suggested of prior examination is less prominent compared to 11/2014 Pathology: Outside mediastinal lymph node (10/13/14): Non-caseating granulomatous inflammation in lymph node 7, 4 R, 10 R, 10 R#2. Benign lymph nodal tissue with scattered non-caseating granulomatous-like inflammation in lymph node 4 R #2. GMS, AFB, & PAS/F stains negative. Histologic features consistent with sarcoidosis Labs: CSF (08/06/14)- 310 WBC (98% lymph), 4 RBC, 114 prot, 85 gluc, 143  IgG. Negative/normal serum ACE, NMO Ab, ANA, ENA, RF, ANCA, cryoglobulin. Negative infectious CNS work-up.  Treatment: 08/2014 - Solu-medrol 1000 mg IV x 5 days followed by prednisone taper down to 20 mg daily. Improved neurologic symptoms.  -10/2014 -Established care w/ [facility] Neurology. Got another solu-med 1000 mg IV x 5 days followed by Imuran. Prednisone stopped after 2nd solu-med pulse. Imuran stopped 10/26/14 in anticipation of rheum  visit. Neuro symptoms worsened  -11/12/14 - Established care w/ [facility] Rhe[facility]. Prednisone taper restarted at 40 mg daily w/ stabilization of symptoms. -12/08/14 - Plan to start Remicade for presumed neurosarcoidosis cancelled. Dr. Felipe Drone of [facility] Neurology determined CNS lesions more typical for multiple sclerosis than for neurosarcoidosis  -12/20/14 and 01/03/15 - Received Rituximab 1000 mg IV x 2 to treat multiple sclerosis and cover for possible neurosarcoidosis. MRI in 03/15 showed improvement. Prednisone tapered off in 02/2015.     Current outpatient prescriptions:  .  acetaminophen (TYLENOL) 500 MG tablet, Take 750 mg by mouth every 6 (six) hours as needed for mild pain. , Disp: , Rfl:  .  albuterol (PROVENTIL HFA;VENTOLIN HFA) 108 (90 BASE) MCG/ACT inhaler, Inhale 2 puffs into the lungs every 6 (six) hours as needed for wheezing or shortness of breath., Disp: 1 Inhaler, Rfl: 6 .  gabapentin (NEURONTIN) 300 MG capsule, Take 1 capsule (300 mg total) by mouth 3 (three) times daily., Disp: 90 capsule, Rfl: 3 .  ibuprofen (ADVIL,MOTRIN) 200 MG tablet, Take 200 mg by mouth every 6 (six) hours as needed., Disp: , Rfl:  .  RiTUXimab (RITUXAN IV), Inject into the vein every 21 ( twenty-one) days., Disp: , Rfl:  .  ALPRAZolam (XANAX) 0.5 MG tablet, Take 1 tablet (0.5 mg total) by mouth at bedtime as needed for anxiety. (Patient not taking: Reported on 03/31/2015), Disp: 20 tablet, Rfl: 0 .  Multiple Vitamin (ONE-A-DAY MENS PO), Take 1 tablet by mouth daily., Disp: , Rfl:  .  pantoprazole (PROTONIX) 40 MG tablet, Take 1 tablet (40 mg total) by mouth daily. (Patient not taking: Reported on 03/31/2015), Disp: 60 tablet, Rfl: 0 .  Pseudoeph-Doxylamine-DM-APAP (NYQUIL PO), Take 30 mLs by mouth at bedtime as needed (flu like symptoms)., Disp: , Rfl:    Review of Systems  Constitutional: Negative for fever and unexpected weight change.  HENT: Negative for congestion, dental problem, ear pain, nosebleeds,  postnasal drip, rhinorrhea, sinus pressure, sneezing, sore throat and trouble swallowing.   Eyes: Negative for redness and itching.  Respiratory: Negative for cough, chest tightness, shortness of breath and wheezing.   Cardiovascular: Positive for chest pain. Negative for palpitations and leg swelling.  Gastrointestinal: Negative for nausea and vomiting.  Genitourinary: Negative for dysuria.  Musculoskeletal: Negative for joint swelling.  Skin: Negative for rash.  Neurological: Negative for headaches.  Hematological: Does not bruise/bleed easily.  Psychiatric/Behavioral: Negative for dysphoric mood. The patient is not nervous/anxious.        Objective:   Physical Exam  Constitutional: He is oriented to person, place, and time. He appears well-developed and well-nourished. No distress.  HENT:  Head: Normocephalic and atraumatic.  Right Ear: External ear normal.  Left Ear: External ear normal.  Mouth/Throat: Oropharynx is clear and moist. No oropharyngeal exudate.  Eyes: Conjunctivae and EOM are normal. Pupils are equal, round, and reactive to light. Right eye exhibits no discharge. Left eye exhibits no discharge. No scleral icterus.  Neck: Normal range of motion. Neck supple. No JVD present. No tracheal deviation present. No thyromegaly present.  Cardiovascular:  Normal rate, regular rhythm and intact distal pulses.  Exam reveals no gallop and no friction rub.   No murmur heard. Pulmonary/Chest: Effort normal and breath sounds normal. No respiratory distress. He has no wheezes. He has no rales. He exhibits no tenderness.  Abdominal: Soft. Bowel sounds are normal. He exhibits no distension and no mass. There is no tenderness. There is no rebound and no guarding.  Musculoskeletal: Normal range of motion. He exhibits no edema or tenderness.  Lymphadenopathy:    He has no cervical adenopathy.  Neurological: He is alert and oriented to person, place, and time. He has normal reflexes. No  cranial nerve deficit. Coordination normal.  Skin: Skin is warm and dry. No rash noted. He is not diaphoretic. No erythema. No pallor.  Psychiatric: He has a normal mood and affect. His behavior is normal. Judgment and thought content normal.  Nursing note and vitals reviewed.   Filed Vitals:   03/31/15 1215  BP: 144/74  Pulse: 104  Height: 5' 6.5" (1.689 m)  Weight: 189 lb (85.73 kg)  SpO2: 98%          Assessment & Plan:     ICD-9-CM ICD-10-CM   1. Mediastinal lymphadenopathy 785.6 R59.9   2. Neurosarcoidosis in adult 135 D86.89    His primary problem appears to be in the neurology system. His pulmonary sarcoidosis early stage. Most recent chest eczema March 2016 is clear. Therefore plan is best left to [facility] neurology. Input from me is very minimal. I will just follow him expectantly. I will see him again in 9 months. I did want him of the risk of opportunistic infections and if these happen to BE the pulmonary system I will see him at short notice     Dr. Brand Males, M.D., [facility].C.P Pulmonary and Critical Care Medicine Staff Physician [facility] Pager: [phone], If no answer or between  15:00h - 7:00h: call [phone]  03/31/2015 12:45 PM

## 2015-04-04 ENCOUNTER — Emergency Department (HOSPITAL_COMMUNITY)
Admission: EM | Admit: 2015-04-04 | Discharge: 2015-04-04 | Disposition: A | Discharge status: Home / Self Care | Payer: Self-pay | Attending: Emergency Medicine | Admitting: Emergency Medicine

## 2015-04-04 ENCOUNTER — Emergency Department (HOSPITAL_COMMUNITY): Payer: Self-pay

## 2015-04-04 DIAGNOSIS — J45909 Unspecified asthma, uncomplicated: Secondary | ICD-10-CM | POA: Insufficient documentation

## 2015-04-04 DIAGNOSIS — K5900 Constipation, unspecified: Secondary | ICD-10-CM | POA: Insufficient documentation

## 2015-04-04 DIAGNOSIS — R109 Unspecified abdominal pain: Secondary | ICD-10-CM | POA: Insufficient documentation

## 2015-04-04 DIAGNOSIS — Z8739 Personal history of other diseases of the musculoskeletal system and connective tissue: Secondary | ICD-10-CM | POA: Insufficient documentation

## 2015-04-04 DIAGNOSIS — Z79899 Other long term (current) drug therapy: Secondary | ICD-10-CM | POA: Insufficient documentation

## 2015-04-04 DIAGNOSIS — Z862 Personal history of diseases of the blood and blood-forming organs and certain disorders involving the immune mechanism: Secondary | ICD-10-CM | POA: Insufficient documentation

## 2015-04-04 DIAGNOSIS — K219 Gastro-esophageal reflux disease without esophagitis: Secondary | ICD-10-CM | POA: Insufficient documentation

## 2015-04-04 LAB — CBC WITH DIFFERENTIAL/PLATELET
Basophils Absolute: 0 10*3/uL (ref 0.0–0.1)
Basophils Relative: 1 % (ref 0–1)
EOS ABS: 0.1 10*3/uL (ref 0.0–0.7)
Eosinophils Relative: 3 % (ref 0–5)
HEMATOCRIT: 40.1 % (ref 39.0–52.0)
HEMOGLOBIN: 13.7 g/dL (ref 13.0–17.0)
LYMPHS PCT: 40 % (ref 12–46)
Lymphs Abs: 2 10*3/uL (ref 0.7–4.0)
MCH: 26.9 pg (ref 26.0–34.0)
MCHC: 34.2 g/dL (ref 30.0–36.0)
MCV: 78.6 fL (ref 78.0–100.0)
MONO ABS: 0.7 10*3/uL (ref 0.1–1.0)
MONOS PCT: 14 % — AB (ref 3–12)
NEUTROS PCT: 42 % — AB (ref 43–77)
Neutro Abs: 2.2 10*3/uL (ref 1.7–7.7)
PLATELETS: 239 10*3/uL (ref 150–400)
RBC: 5.1 MIL/uL (ref 4.22–5.81)
RDW: 13.2 % (ref 11.5–15.5)
WBC: 5 10*3/uL (ref 4.0–10.5)

## 2015-04-04 LAB — COMPREHENSIVE METABOLIC PANEL
ALBUMIN: 4.6 g/dL (ref 3.5–5.0)
ALK PHOS: 67 U/L (ref 38–126)
ALT: 19 U/L (ref 17–63)
ANION GAP: 10 (ref 5–15)
AST: 27 U/L (ref 15–41)
BUN: 14 mg/dL (ref 6–20)
CALCIUM: 9.6 mg/dL (ref 8.9–10.3)
CO2: 23 mmol/L (ref 22–32)
CREATININE: 0.94 mg/dL (ref 0.61–1.24)
Chloride: 105 mmol/L (ref 101–111)
GFR calc non Af Amer: >60 mL/min (ref >60)
GLUCOSE: 95 mg/dL (ref 65–99)
POTASSIUM: 4.6 mmol/L (ref 3.5–5.1)
SODIUM: 138 mmol/L (ref 135–145)
Total Bilirubin: 1 mg/dL (ref 0.3–1.2)
Total Protein: 7.6 g/dL (ref 6.5–8.1)

## 2015-04-04 LAB — URINALYSIS, ROUTINE W REFLEX MICROSCOPIC
Bilirubin Urine: NEGATIVE
Glucose, UA: NEGATIVE mg/dL
Hgb urine dipstick: NEGATIVE
KETONES UR: NEGATIVE mg/dL
LEUKOCYTES UA: NEGATIVE
Nitrite: NEGATIVE
Protein, ur: NEGATIVE mg/dL
Specific Gravity, Urine: 1.01 (ref 1.005–1.030)
Urobilinogen, UA: 1 mg/dL (ref 0.0–1.0)
pH: 6.5 (ref 5.0–8.0)

## 2015-04-04 LAB — LIPASE, BLOOD: LIPASE: 23 U/L (ref 22–51)

## 2015-04-04 LAB — I-STAT TROPONIN, ED: Troponin i, poc: 0 ng/mL (ref 0.00–0.08)

## 2015-04-04 MED ORDER — SODIUM CHLORIDE 0.9 % IV SOLN
1000.0000 mL | Freq: Once | INTRAVENOUS | Status: AC
  Administered 2015-04-04: 1000 mL via INTRAVENOUS

## 2015-04-04 MED ORDER — KETOROLAC TROMETHAMINE 30 MG/ML IJ SOLN
30.0000 mg | Freq: Once | INTRAMUSCULAR | Status: AC
  Administered 2015-04-04: 30 mg via INTRAVENOUS
  Filled 2015-04-04: qty 1

## 2015-04-04 MED ORDER — ONDANSETRON HCL 4 MG/2ML IJ SOLN
4.0000 mg | Freq: Once | INTRAMUSCULAR | Status: DC
  Filled 2015-04-04: qty 2

## 2015-04-04 NOTE — Discharge Instructions (Signed)
Abdominal Pain Many things can cause belly (abdominal) pain. Most times, the belly pain is not dangerous. Many cases of belly pain can be watched and treated at home. HOME CARE   Do not take medicines that help you go poop (laxatives) unless told to by your doctor.  Only take medicine as told by your doctor.  Eat or drink as told by your doctor. Your doctor will tell you if you should be on a special diet. GET HELP IF:  You do not know what is causing your belly pain.  You have belly pain while you are sick to your stomach (nauseous) or have runny poop (diarrhea).  You have pain while you pee or poop.  Your belly pain wakes you up at night.  You have belly pain that gets worse or better when you eat.  You have belly pain that gets worse when you eat fatty foods.  You have a fever. GET HELP RIGHT AWAY IF:   The pain does not go away within 2 hours.  You keep throwing up (vomiting).  The pain changes and is only in the right or left part of the belly.  You have bloody or tarry looking poop. MAKE SURE YOU:   Understand these instructions.  Will watch your condition.  Will get help right away if you are not doing well or get worse. Document Released: 04/09/2008 Document Revised: 10/27/2013 Document Reviewed: 07/01/2013 [facility] Patient Information 2015 [place], [place]. This information is not intended to replace advice given to you by your health care provider. Make sure you discuss any questions you have with your health care provider. There is no change in your labs.  Urine does not show any sign of infection.  Your acute abdomen series shows no obstruction you are, liver profile, gallbladder function.  Lipase, or pancreas function are all normal.  Please make an appointment with your primary care physician for further evaluation.  Return if you develop fever, vomiting, constipation, diarrhea

## 2015-04-04 NOTE — ED Notes (Signed)
Patient stated that he was not nauseated

## 2015-04-04 NOTE — ED Provider Notes (Signed)
CSN: [id number]     Arrival date & time 04/04/15  0024 History   First MD Initiated Contact with Patient 04/04/15 [phone]     Chief Complaint  Patient presents with  . Flank Pain     (Consider location/radiation/quality/duration/timing/severity/associated sxs/prior Treatment) Patient is a 34 y.o. male presenting with flank pain. The history is provided by the patient.  Flank Pain This is a recurrent problem. The current episode started today. The problem occurs intermittently. The problem has been gradually worsening. Associated symptoms include abdominal pain. Pertinent negatives include no anorexia, change in bowel habit, chest pain, coughing, fever, nausea, rash, urinary symptoms or vomiting. Nothing aggravates the symptoms. Treatments tried: Gabapentin and ibuprofen. The treatment provided moderate relief.    Past Medical History  Diagnosis Date  . Asthma   . Subacute transverse myelitis   . Bronchitis   . GERD (gastroesophageal reflux disease)   . Sarcoidosis    Past Surgical History  Procedure Laterality Date  . No past surgeries    . Video bronchoscopy with endobronchial ultrasound N/A 09/08/2014    Procedure: VIDEO BRONCHOSCOPY WITH ENDOBRONCHIAL ULTRASOUND;  Surgeon: Brand Males, MD;  Location: [place];  Service: Thoracic;  Laterality: N/A;  . Mediastinoscopy N/A 10/13/2014    Procedure: MEDIASTINOSCOPY;  Surgeon: Melrose Nakayama, MD;  Location: [place];  Service: Thoracic;  Laterality: N/A;  . Lymph node biopsy N/A 10/13/2014    Procedure: LYMPH NODE BIOPSY;  Surgeon: Melrose Nakayama, MD;  Location: [facility] OR;  Service: Thoracic;  Laterality: N/A;   Family History  Problem Relation Age of Onset  . Diabetes Other   . Thyroid disease Mother   . Seizures Father    History  Substance Use Topics  . Smoking status: Never Smoker   . Smokeless tobacco: Never Used     Comment: pt states he smoked socially  . Alcohol Use: Yes     Comment: occassional weekends     Review of Systems  Constitutional: Negative for fever.  Respiratory: Negative for cough and shortness of breath.   Cardiovascular: Negative for chest pain.  Gastrointestinal: Positive for abdominal pain and constipation. Negative for nausea, vomiting, diarrhea, anorexia and change in bowel habit.  Genitourinary: Negative for dysuria, flank pain and testicular pain.  Skin: Negative for rash and wound.  All other systems reviewed and are negative.     Allergies  Review of patient's allergies indicates no known allergies.  Home Medications   Prior to Admission medications   Medication Sig Start Date End Date Taking? Authorizing Provider  albuterol (PROVENTIL HFA;VENTOLIN HFA) 108 (90 BASE) MCG/ACT inhaler Inhale 2 puffs into the lungs every 6 (six) hours as needed for wheezing or shortness of breath. 09/08/14  Yes Brand Males, MD  gabapentin (NEURONTIN) 800 MG tablet Take 800 mg by mouth 3 (three) times daily.   Yes Historical Provider, MD  ibuprofen (ADVIL,MOTRIN) 200 MG tablet Take 200 mg by mouth every 6 (six) hours as needed for moderate pain.    Yes Historical Provider, MD  Multiple Vitamin (ONE-A-DAY MENS PO) Take 1 tablet by mouth daily.   Yes Historical Provider, MD  acetaminophen (TYLENOL) 500 MG tablet Take 750 mg by mouth every 6 (six) hours as needed for mild pain.     Historical Provider, MD  ALPRAZolam Duanne Moron) 0.5 MG tablet Take 1 tablet (0.5 mg total) by mouth at bedtime as needed for anxiety. Patient not taking: Reported on 03/31/2015 11/03/14   Charlesetta Shanks, MD  gabapentin (NEURONTIN) 300  MG capsule Take 1 capsule (300 mg total) by mouth 3 (three) times daily. Patient not taking: Reported on 04/04/2015 11/08/14   Kathrynn Ducking, MD  pantoprazole (PROTONIX) 40 MG tablet Take 1 tablet (40 mg total) by mouth daily. Patient not taking: Reported on 03/31/2015 08/09/14   Reyne Dumas, MD  Pseudoeph-Doxylamine-DM-APAP (NYQUIL PO) Take 30 mLs by mouth at bedtime as needed  (flu like symptoms).    Historical Provider, MD  RiTUXimab (RITUXAN IV) Inject 1 each into the vein every 6 (six) months.     Historical Provider, MD   BP 134/75 mmHg  Pulse 72  Temp(Src) 98.1 F (36.7 C) (Oral)  Resp 18  Ht 5\' 6"  (1.676 m)  Wt 188 lb (85.276 kg)  BMI 30.36 kg/m2  SpO2 100% Physical Exam  Constitutional: He appears well-developed and well-nourished.  HENT:  Head: Normocephalic.  Eyes: Pupils are equal, round, and reactive to light.  Neck: Normal range of motion.  Cardiovascular: Normal rate and regular rhythm.   Pulmonary/Chest: Effort normal.  Abdominal: Soft. Bowel sounds are normal. He exhibits no distension. There is no tenderness.    Musculoskeletal: Normal range of motion. He exhibits no edema or tenderness.  Neurological: He is alert.  Skin: Skin is warm and dry. No rash noted.  Vitals reviewed.   ED Course  Procedures (including critical care time) Labs Review Labs Reviewed  CBC WITH DIFFERENTIAL/PLATELET - Abnormal; Notable for the following:    Neutrophils Relative % 42 (*)    Monocytes Relative 14 (*)    All other components within normal limits  COMPREHENSIVE METABOLIC PANEL  LIPASE, BLOOD  URINALYSIS, ROUTINE W REFLEX MICROSCOPIC (NOT AT [facility])  Randolm Idol, ED    Imaging Review Dg Abd Acute W/chest  04/04/2015   CLINICAL DATA:  Left flank pain. Acute superimposed on chronic pain. History of sarcoidosis.  EXAM: DG ABDOMEN ACUTE W/ 1V CHEST  COMPARISON:  Chest radiograph 01/06/2015, PET/CT 08/27/2014  FINDINGS: The heart size is normal. There is mild right hilar prominence, unchanged. There are increased central bronchovascular markings. There is no free intra-abdominal air. No dilated bowel loops to suggest obstruction. Small volume of stool throughout the colon. No radiopaque calculi. No acute osseous abnormalities are seen.  IMPRESSION: 1. Normal bowel gas pattern. No free intra-abdominal air. No radiopaque calculi. 2. Increased central  bronchovascular markings and right hilar prominence, likely related to history of sarcoidosis.   Electronically Signed   By: Jeb Levering M.D.   On: 04/04/2015 03:46     EKG Interpretation None     Results x-ray results EKG, discussed with patient.  There is no change in any of his parameters.  He's been given IV Toradol in the emergency department.  He's been told that he can take over-the-counter ibuprofen plus is normal.  Medications and is to follow-up with his primary care physician for further evaluation if he continues to have pain.  He is to return if he develops fever, nausea, vomiting, diarrhea MDM   Final diagnoses:  Abdominal pain         Junius Creamer, NP 04/04/15 0543  Junius Creamer, NP 04/04/15 8841  Rolland Porter, MD 04/04/15 [phone]

## 2015-04-04 NOTE — ED Notes (Signed)
EKG given to [facility]

## 2015-04-04 NOTE — ED Notes (Addendum)
Pt arrived to the ED with a complaint of left sided flank pain that extends around the back and abdomen.  Pt has been having pain for a year and is on gabapentin for said pain.  Pt states that he has .  Pt states the pain has neuro sarcoidosis that has intensified over the last two days. Pt has been also taking ibuprofen without relief.

## 2015-04-04 NOTE — ED Notes (Signed)
Patient transported to X-ray 

## 2015-04-08 ENCOUNTER — Telehealth: Payer: Self-pay | Admitting: Internal Medicine

## 2015-04-08 NOTE — Telephone Encounter (Signed)
Pt aware of recs. appt scheduled for Monday at 9:30 with TP.

## 2015-04-08 NOTE — Telephone Encounter (Signed)
Patient wants to know how he can know if he needs another dose of the Rituxan.  Patient says he feels tightness in chest,  Swelling in chest, not feeling short of breath, back pain, side pain under rib cage, headache.  Patient wants to know more information about Sarcoidosis.  He wants to know how he should feel?  Does he need to take more Prednisone?    No Known Allergies  To Dr. Melvyn Novas in MR absence

## 2015-04-08 NOTE — Telephone Encounter (Signed)
These questions can't be answered over the phone - needs ov asap and if condition worsens before this can be arranged needs to be eval in UC or ER

## 2015-04-11 ENCOUNTER — Ambulatory Visit: Payer: Self-pay | Admitting: Adult Health

## 2015-04-12 ENCOUNTER — Telehealth: Payer: Self-pay | Admitting: Internal Medicine

## 2015-04-12 NOTE — Telephone Encounter (Signed)
lmtcb for pt.  

## 2015-04-13 MED ORDER — ALBUTEROL SULFATE HFA 108 (90 BASE) MCG/ACT IN AERS: 2.0000 | INHALATION_SPRAY | Freq: Four times a day (QID) | RESPIRATORY_TRACT | Status: DC | PRN

## 2015-04-13 NOTE — Telephone Encounter (Signed)
Spoke with pt, rx e-scribed to pt's preferred pharmacy.  Nothing further needed.

## 2015-04-28 ENCOUNTER — Ambulatory Visit (INDEPENDENT_AMBULATORY_CARE_PROVIDER_SITE_OTHER): Payer: Self-pay | Admitting: Internal Medicine

## 2015-04-28 ENCOUNTER — Telehealth: Payer: Self-pay | Admitting: Internal Medicine

## 2015-04-28 ENCOUNTER — Encounter: Payer: Self-pay | Admitting: Internal Medicine

## 2015-04-28 VITALS — BP 122/66 | HR 60 | Temp 98.4°F | Ht 66.5 in | Wt 193.8 lb

## 2015-04-28 DIAGNOSIS — R0600 Dyspnea, unspecified: Secondary | ICD-10-CM

## 2015-04-28 DIAGNOSIS — M542 Cervicalgia: Secondary | ICD-10-CM

## 2015-04-28 MED ORDER — PANTOPRAZOLE SODIUM 40 MG PO TBEC
40.0000 mg | DELAYED_RELEASE_TABLET | Freq: Every day | ORAL | Status: DC
Start: 2015-04-28 — End: 2015-05-30

## 2015-04-28 MED ORDER — FAMOTIDINE 20 MG PO TABS: ORAL_TABLET | ORAL | Status: DC

## 2015-04-28 NOTE — Telephone Encounter (Signed)
Fine  With me but bring all meds Ned Clines etc

## 2015-04-28 NOTE — Telephone Encounter (Signed)
Called and spoke to pt. Appt made with MW today (04/28/15) and was informed to bring all meds. Pt verbalized understanding and denied any further questions or concerns at this time.

## 2015-04-28 NOTE — Patient Instructions (Signed)
Neck stretching exercises might help  Try off albuterol to see what difference it makes with breathing  Add pepcid ac 20 mg after supper   Prilosec Take 30-60 min before first meal of the day   GERD (REFLUX)  is an extremely common cause of respiratory symptoms just like yours , many times with no obvious heartburn at all.    It can be treated with medication, but also with lifestyle changes including elevation of the head of your bed (ideally with 6 inch  bed blocks),  Smoking cessation, avoidance of late meals, excessive alcohol, and avoid fatty foods, chocolate, peppermint, colas, red wine, and acidic juices such as orange juice.  NO MINT OR MENTHOL PRODUCTS SO NO COUGH DROPS  USE SUGARLESS CANDY INSTEAD (Jolley ranchers or Stover's or Life Savers) or even ice chips will also do - the key is to swallow to prevent all throat clearing. NO OIL BASED VITAMINS - use powdered substitutes.

## 2015-04-28 NOTE — Progress Notes (Signed)
Subjective:    Patient ID: Gregory Fuller, male    DOB: May 28, 1981, 34 y.o.   MRN: [id number] PCP Nilda Simmer, MD  Carolan Shiver, MD - rheuma [address]  [place], [place] [address]  [phone]  [phone] (Fax)   Noni Saupe, MD  [address]  [id number] [facility]  [place], [place] [address]  [phone]  [phone] (Fax)   Lawson Radar Matrunick (Attending) - neuro [phone] (Work) [phone] (Fax)  [address] [place],  [address]  HPI    OV 12/30/2014  Chief Complaint  Patient presents with  . Follow-up    Pt c/o increaes in SOB, weakness, mild tremors, extremities tingling, left sided chest tightness that radiated to right chest that all presented this morning. Pt stated he rested and his symptoms resolved but still had the left sided chest pain.    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 At that time the plan was for him to continue with steroid and TNF alpha blockade treatment through [facility]. The plan was to see him back around April 2016. He returns acutely today because he says in the interim he got laid off from his job. He did apply to a new job today. He does not think he is particularly anxious but then in the middle of  shower as he got out he started noticing sudden onset of acute on chronic tingling office hands and feet associated with some chest tightness and tremulousness but he says he was not hyperventilating all that he did feel cold. Episode lasted a few minutes and then resolved. He got scared that this was due to worsening neurosarcoidosis and pulmonary sarcoidosis and therefore he is here. There is no edema or syncope or chest pain or fall or hemoptysis or chills or ridgorss. No prior history of panic attacks   Off note on 12/06/2014 he did see Dr. Carroll Kinds his neurologist at [facility]. Plan was to try start him on TNF alpha blockade Remicade  but appears on 12/13/2014 this position was changed to Rituxan which he says he got one dose a week or so ago at the 60 mg. He tolerated the infusion fine. It is unclear to me why the switch was made. He thinks this is because the suspicion is that he might not have neurosarcoidosis.     OV 03/31/2015  Chief Complaint  Patient presents with  . Follow-up    Pt here for a 3 month f/u. Pt still c/o intermittent sharp pain in LUQ that radiates to left middle back. Pt denies cough and SOB.    FU early stage pulmonary sarcoidosis with spinal lesions thought to be neurosarcoidosis versus multiple sclerosis by [facility]  Last seen through 2016. Since then he has continued to follow-up at [facility]. He is on IV Rituxan infusions. These are going well. He feels that his balance is improved particularly with the help of physical therapy there. I did review the old chart and confirmed this. He says that his diagnosis is being changed back to neurosarcoidosis of the spine as a posterior multiple sclerosis. I reviewed both the rheumatology chart and neurology chart from [facility] and a documented below and it appears that the neurologist is leaning more towards neurosarcoidosis. The treatment plan is IV Rituxan. At some point he might go on methotrexate according to the chart. From a respirator standpoint he has no shortness of breath or wheezing or  cough or chest tightness or hemoptysis or orthopnea or paroxysmal nocturnal dyspnea or edema or fever or chills. Chest x-ray 01/06/2015 in our system is clear and it is documented below  On the other hand he continues to have left thoracic approximately T6-T8 left-sided radiculopathy for which neurology is maintaining him on gabapentin. He is frustrated by its persistence. He has not tried acupuncture due to loss of job.     LAB review cxr 01/06/15 - clear lung fields in our system - personally visualized imaged LABs - 01/06/15 - creat 0.96mg %, hgb 14gm%,  wbc 8.6K  CHART REVIEW  Last seen 03/07/2015 [facility] of rheumatology and his assessment and plan has been copied and pasted in blue verbatin "   " asymptomatic biopsy-proven pulmonary sarcoidodis and symptomatic brain and spinal CNS lesions concerning for multiple sclerosis and/or neurosarcoidosis. He has no signs or symptoms of active sarcoidosis today, and CXR indicates response of his asymptomatic pulmonary nodules to the steroids and/or Rituxan. His CNS lesions are also improving with the treatment. Dr. Felipe Drone is managing immunosuppression for his CNS lesions. The brain lesions are consistent with MS, and per my last conversation with Dr. Felipe Drone the spinal lesions are more consistent with MS than with neurosarcoidosis. It is possible that he has longstanding asymptomatic pulmonary sarcoidosis that was incidentally discovered when he developed multiple sclerosis, but his treatment needs to cover both multiple sclerosis and neurosarcoidosis. We discussed methotrexate side effects in case another steroid sparing agent is needed in the future for sarcoidosis, though there is no indication to start it now and it would not be the agent of choice for multiple sclerosis relapse.  Plan: 1) Pulmonary sarcoidosis with possible multiple sclerosis vs neurosarcoidosis -Check CBC w/ diff, AST, ALT, Cr -No need to start additional immunosuppression at this time.  -Important to avoid anti-TNF agents -Continue annual eye exams."   04/28/2015 f/u ov/Marisal Swarey re:  New positional neck pain  Chief Complaint  Patient presents with  . Acute Visit    Pt c/o left side neck pain x 2 wks. He also c/o difficulty breathing at night for the past few days. He has also noticed chest tightness.    Also cc late night short of breath well after supper /  sob better when goes to sleep  Neck is positonal worse head tilt   Takes saba before running x months but never missed a dose since HS Can work out x one  hour including aerobics p saba    No obvious day to day or daytime variabilty or assoc chronic cough or cp or  subjective wheeze overt sinus or hb symptoms. No unusual exp hx or h/o childhood pna/ asthma or knowledge of premature birth.  Sleeping ok without nocturnal  or early am exacerbation  of respiratory  c/o's or need for noct saba. Also denies any obvious fluctuation of symptoms with weather or environmental changes or other aggravating or alleviating factors except as outlined above   Current Medications, Allergies, Complete Past Medical History, Past Surgical History, Family History, and Social History were reviewed in [employer] record.  ROS  The following are not active complaints unless bolded sore throat, dysphagia, dental problems, itching, sneezing,  nasal congestion or excess/ purulent secretions, ear ache,   fever, chills, sweats, unintended wt loss, pleuritic or exertional cp, hemoptysis,  orthopnea pnd or leg swelling, presyncope, palpitations, abdominal pain, anorexia, nausea, vomiting, diarrhea  or change in bowel or urinary habits, change in stools or urine,  dysuria,hematuria,  rash, arthralgias, visual complaints, headache, numbness weakness or ataxia or problems with walking or coordination,  change in mood/affect or memory.        Past medical as from [facility] Sarcoidosis ([facility]-HCC) - Primary  Biopsy-proven asymptomatic pulmonary sarcoidosis. Spinal cord lesions w/ inflamatory CSF that could be neurosarcoidosis or multiple sclerosis Diagnosed: 08/2014. Presented to OSH w/ to neurologic symptoms. CT chest showed hilar lymphadenopathy, MRI showed brain & spinal lesions. Biopsy shows pulm sarcoid; CNS imaging is more consistent w/ multiple sclerosis  Disease activity/symptoms: Sensation of chest tightness (unclear if pulmonary sarcoid or due to CNS lesions). From CNS lesions: Neck and lower back pain, lower extremity paresthesias and impaired proprioception  resulting in impaired balance, left upper extremity numbness Damage: -Outside PFT's in 10/15 to 12/15: reportedly "fine" -MRI brain w/ & w/o contrast (10/19/14): Multiple white matter lesions in a distribution consistent with multiple sclerosis.  -MRI cervical/thoracic spine (10/19/14): Longitudinally extensive cervical and thoracic myelopathy with expansion, edema and enhancement predominantly along the dorsal and dorsolateral cord, and minimal central cord enhancement. Unusual for multiple sclerosis. Possibly demyelinating.  -MRI brain (01/19/15): Stable lesions, consistent w/ MS -MRI cervical/thoracic spine (01/19/15): Improvement in inflamatory lesions. No new lesions. -CXR (03/03/15)- Hilar fullness suggested of prior examination is less prominent compared to 11/2014 Pathology: Outside mediastinal lymph node (10/13/14): Non-caseating granulomatous inflammation in lymph node 7, 4 R, 10 R, 10 R#2. Benign lymph nodal tissue with scattered non-caseating granulomatous-like inflammation in lymph node 4 R #2. GMS, AFB, & PAS/F stains negative. Histologic features consistent with sarcoidosis Labs: CSF (08/06/14)- 310 WBC (98% lymph), 4 RBC, 114 prot, 85 gluc, 143 IgG. Negative/normal serum ACE, NMO Ab, ANA, ENA, RF, ANCA, cryoglobulin. Negative infectious CNS work-up.  Treatment: 08/2014 - Solu-medrol 1000 mg IV x 5 days followed by prednisone taper down to 20 mg daily. Improved neurologic symptoms.  -10/2014 -Established care w/ [facility]. Got another solu-med 1000 mg IV x 5 days followed by Imuran. Prednisone stopped after 2nd solu-med pulse. Imuran stopped 10/26/14 in anticipation of rheum visit. Neuro symptoms worsened  -11/12/14 - Established care w/ [facility]. Prednisone taper restarted at 40 mg daily w/ stabilization of symptoms. -12/08/14 - Plan to start Remicade for presumed neurosarcoidosis cancelled. Dr. Felipe Drone of [facility] determined CNS lesions more typical for multiple sclerosis  than for neurosarcoidosis  -12/20/14 and 01/03/15 - Received Rituximab 1000 mg IV x 2 to treat multiple sclerosis and cover for possible neurosarcoidosis. MRI in 03/15 showed improvement. Prednisone tapered off in 02/2015.             Objective:   amb wm nad  Wt Readings from Last 3 Encounters:  04/28/15 193 lb 12.8 oz (87.907 kg)  04/04/15 188 lb (85.276 kg)  03/31/15 189 lb (85.73 kg)    Vital signs reviewed  HEENT: nl dentition, turbinates, and orophanx. Nl external ear canals without cough reflex   NECK :  without JVD/Nodes/TM/ nl carotid upstrokes bilaterally/ tender along L lateral cervical muscles s mass/nodes/ erythema or swelling    LUNGS: no acc muscle use, clear to A and P bilaterally without cough on insp or exp maneuvers   CV:  RRR  no s3 or murmur or increase in P2, no edema   ABD:  soft and nontender with nl excursion in the supine position. No bruits or organomegaly, bowel sounds nl  MS:  warm without deformities, calf tenderness, cyanosis or clubbing - pain on R abd of neck s radicular features  SKIN: warm and dry without lesions    NEURO:  alert, approp, no deficits                Assessment & Plan:

## 2015-04-28 NOTE — Telephone Encounter (Signed)
Spoke with pt. He reports he is having increase SOB (worse at night). He doesn't want to go to ED. MR not in office. Can we work pt in on your schedule this afternoon MW? thanks

## 2015-05-01 DIAGNOSIS — M542 Cervicalgia: Secondary | ICD-10-CM | POA: Insufficient documentation

## 2015-05-01 NOTE — Assessment & Plan Note (Signed)
No radicular features/ reproduced on abd of c spine > rec strethching heat or ice/ f/u prn

## 2015-05-01 NOTE — Assessment & Plan Note (Signed)
Symptoms are markedly disproportionate to objective findings and not clear this is a lung problem but pt does appear to have difficult airway management issues. DDX of  difficult airways management all start with A and  include Adherence, Ace Inhibitors, Acid Reflux, Active Sinus Disease, Alpha 1 Antitripsin deficiency, Anxiety masquerading as Airways dz,  ABPA,  allergy(esp in young), Aspiration (esp in elderly), Adverse effects of meds,  Active smokers, A bunch of PE's (a small clot burden can't cause this syndrome unless there is already severe underlying pulm or vascular dz with poor reserve) plus two Bs  = Bronchiectasis and Beta blocker use..and one C= CHF  Adherence is always the initial "prime suspect" and is a multilayered concern that requires a "trust but verify" approach in every patient - starting with knowing how to use medications, especially inhalers, correctly, keeping up with refills and understanding the fundamental difference between maintenance and prns vs those medications only taken for a very short course and then stopped and not refilled.   ? Acid (or non-acid) GERD > always difficult to exclude as up to 75% of pts in some series report no assoc GI/ Heartburn symptoms> rec max (24h)  acid suppression and diet restrictions/ reviewed and instructions given in writing.   ? Anxiety > suggested by the fact he has sob at rest but not with exertion   ? Allergy/ asthma > has taken saba before ex since HS and says never ever tried it s saba first > suggested he do so while on max gerd rx /diet and let Dr Chase Caller know what difference if any this makes

## 2015-05-16 ENCOUNTER — Encounter: Payer: Self-pay | Admitting: Gastroenterology

## 2015-05-29 ENCOUNTER — Other Ambulatory Visit: Payer: Self-pay | Admitting: Internal Medicine

## 2015-05-30 NOTE — Telephone Encounter (Signed)
Patient requesting refill on Protonix and Gabapentin.  Wants to be able to pick up both prescriptions today.   Gabapentin was not prescribed by our office.   Called and left message for patient to call back to advise what provider prescribed the prescription. Awaiting call back from patient.

## 2015-05-31 MED ORDER — PANTOPRAZOLE SODIUM 40 MG PO TBEC: 40.0000 mg | DELAYED_RELEASE_TABLET | Freq: Every day | ORAL | Status: DC

## 2015-05-31 NOTE — Telephone Encounter (Signed)
Spoke with pt, states he gets his gabapentin through his neurologist in [place].  Protonix has been refilled.  Nothing further needed.

## 2015-06-02 ENCOUNTER — Encounter: Payer: Self-pay | Admitting: Family Medicine

## 2015-06-02 ENCOUNTER — Ambulatory Visit (INDEPENDENT_AMBULATORY_CARE_PROVIDER_SITE_OTHER): Payer: Self-pay | Admitting: Family Medicine

## 2015-06-02 VITALS — BP 123/71 | HR 54 | Temp 98.2°F | Resp 16 | Ht 66.5 in | Wt 195.0 lb

## 2015-06-02 DIAGNOSIS — Z7689 Persons encountering health services in other specified circumstances: Secondary | ICD-10-CM

## 2015-06-02 DIAGNOSIS — Z7189 Other specified counseling: Secondary | ICD-10-CM

## 2015-06-02 NOTE — Patient Instructions (Signed)
  Would recommend miralax for constipation. Drink plenty of liquids.Eat lots of friuits and vegetables. Continue medicaions as prescribed elsewhere You had bloodwork in late May, so you do not need any at this time. If your rashes itch at times can use hydrocortisone cream. I do not see anything to indicate a referral to GI.

## 2015-06-02 NOTE — Progress Notes (Signed)
Patient ID: Gregory Fuller, male   DOB: 1981-06-12, 34 y.o.   MRN: [id number]   Gregory Fuller, is a 34 y.o. male  DGU:[id number]    DOB - 1981-01-21  CC:  Chief Complaint  Patient presents with  . Establish Care    pain in ribs which travels up left arm. abdomen pain.          HPI:         No Known Allergies  Past Medical History  Diagnosis Date  . Asthma   . Subacute transverse myelitis   . Bronchitis   . GERD (gastroesophageal reflux disease)   . Sarcoidosis    Titus's family history includes Diabetes in his other; Seizures in his father; Thyroid disease in his mother.  History   Social History  . Marital Status: Single    Spouse Name: N/A  . Number of Children: 0  . Years of Education: 16   Occupational History  . Manager     [employer]   Social History Main Topics  . Smoking status: Never Smoker   . Smokeless tobacco: Never Used     Comment: pt states he smoked socially  . Alcohol Use: 0.6 oz/week    1 Glasses of wine per week     Comment: occassional weekends  . Drug Use: No     Comment: used to smoke marijuana  . Sexual Activity: Not on file   Other Topics Concern  . Not on file   Social History Narrative   Past Surgical History  Procedure Laterality Date  . No past surgeries    . Video bronchoscopy with endobronchial ultrasound N/A 09/08/2014    Procedure: VIDEO BRONCHOSCOPY WITH ENDOBRONCHIAL ULTRASOUND;  Surgeon: Brand Males, MD;  Location: [place];  Service: Thoracic;  Laterality: N/A;  . Mediastinoscopy N/A 10/13/2014    Procedure: MEDIASTINOSCOPY;  Surgeon: Melrose Nakayama, MD;  Location: [place];  Service: Thoracic;  Laterality: N/A;  . Lymph node biopsy N/A 10/13/2014    Procedure: LYMPH NODE BIOPSY;  Surgeon: Melrose Nakayama, MD;  Location: [place];  Service: Thoracic;  Laterality: N/A;       ROS:  GEN:   Denies fever, chills,WT loss, loss of appetite Skin:   Denies lesions or rashes HENT:   Denies  earache, epistaxis,  sore throat, or neck pain, or headaches EYES:   Denies eye pain or drainage.               LUNGS:  Denies SOB with rest or walking; couging, choking CV:   Denies CP or palpitations ABD:   Denies abdominal pain, nausea,and vomiting, diarrhea  GU:   Denies frequency, urgency, dysuria          EXT:    Denies muscle spasms or swelling; no pain in lower ext,no unilateral    weakness  NEURO:   Denies numbness or tingling, denies seizures  Objective:  Filed Vitals:   06/02/15 0927  BP: 123/71  Pulse: 54  Temp: 98.2 F (36.8 C)  TempSrc: Oral  Resp: 16  Height: 5' 6.5" (1.689 m)  Weight: 195 lb (88.451 kg)    Physical Exam:  General:   Well-developed, well-nourished, in no acute distress Skin:      Warm and dry, no significant rashes.     Head :    Normocephalic, atraumatic, no pallor, Eyes:      No icterus, drainage, PERL, EOMS intact Ears:      Clear bilaterally, TMs within  normal limits. Nose:   nares patent w/o significant congestion or inflammation   Neck:      Supple FROM w/o adenopathy, tenderness, or thyroidomegaly No JVD     or tracheal diviation Heart:     Normal  RRR,without M,G,R Lungs:    Clear to auscultation bilaterally. No increase in respiratory effort.No   wheezes, rales or stridor Abdomen:  Soft, nontender, nondistended, positive bowel sounds, no guarding or     rebound Exetremeties:  No pedal edema.FROM, 2+pedal pulses, No unilateral weakness.   Neuro:   Alert, oriented, appropriate, non-focal.  Pertinent Lab Results:  Lab Results  Component Value Date   WBC 5.0 04/04/2015   HGB 13.7 04/04/2015   HCT 40.1 04/04/2015   MCV 78.6 04/04/2015   PLT 239 04/04/2015     Chemistry      Component Value Date/Time   NA 138 04/04/2015 0217   K 4.6 04/04/2015 0217   CL 105 04/04/2015 0217   CO2 23 04/04/2015 0217   BUN 14 04/04/2015 0217   CREATININE 0.94 04/04/2015 0217      Component Value Date/Time   CALCIUM 9.6 04/04/2015 0217   ALKPHOS 67 04/04/2015 0217    AST 27 04/04/2015 0217   ALT 19 04/04/2015 0217   BILITOT 1.0 04/04/2015 0217      No results found for: HGBA1C  Medications: Prior to Admission medications   Medication Sig Start Date End Date Taking? Authorizing Provider  albuterol (PROVENTIL HFA;VENTOLIN HFA) 108 (90 BASE) MCG/ACT inhaler Inhale 2 puffs into the lungs every 6 (six) hours as needed for wheezing or shortness of breath. 04/13/15  Yes Brand Males, MD  docusate sodium (COLACE) 100 MG capsule Take 100 mg by mouth daily.   Yes Historical Provider, MD  gabapentin (NEURONTIN) 800 MG tablet Take 800 mg by mouth 3 (three) times daily.   Yes Historical Provider, MD  ibuprofen (ADVIL,MOTRIN) 200 MG tablet Take 200 mg by mouth every 6 (six) hours as needed for moderate pain.    Yes Historical Provider, MD  pantoprazole (PROTONIX) 40 MG tablet Take 1 tablet (40 mg total) by mouth daily. 05/31/15  Yes Tanda Rockers, MD  RiTUXimab (RITUXAN ISV) Inject 1 each into the vein every 6 (six) months.    Yes Historical Provider, MD  famotidine (PEPCID) 20 MG tablet One at bedtime Patient not taking: Reported on 06/02/2015 04/28/15   Tanda Rockers, MD    Assessment:  Establishment of care  I have review his [facility] and other pertinent lab work.  GERD - I instructed him I am willing to start prescribing his protonix.  Sarcoidosis -Follow-up with Dr. Chase Caller at [facility]  - History of Transverse Myelitis -Follow-up with Dr. Jannifer Franklin as needed.  Skin changes, minor and consistent with age -explained there is no need for concern.  Left flank pain, probably musculoskeletal -Reassurance that he does no need to see a Gastroenterologist or other specialist - I see no diagnosed reason for gabapentin, either in history or exam.     Follow up:6 months and prn.  The patient was given clear instructions to go to ER or return to medical center if symptoms don't improve, worsen or new problems develop. The patient verbalized  understanding.     Micheline Chapman, FNP,BC 06/02/2015, 9:51 AM

## 2015-07-18 ENCOUNTER — Ambulatory Visit (INDEPENDENT_AMBULATORY_CARE_PROVIDER_SITE_OTHER): Payer: Self-pay | Admitting: Gastroenterology

## 2015-07-18 ENCOUNTER — Encounter: Payer: Self-pay | Admitting: Gastroenterology

## 2015-07-18 VITALS — BP 126/80 | HR 72 | Ht 66.5 in | Wt 196.0 lb

## 2015-07-18 DIAGNOSIS — R194 Change in bowel habit: Secondary | ICD-10-CM

## 2015-07-18 DIAGNOSIS — R198 Other specified symptoms and signs involving the digestive system and abdomen: Secondary | ICD-10-CM

## 2015-07-18 MED ORDER — NA SULFATE-K SULFATE-MG SULF 17.5-3.13-1.6 GM/177ML PO SOLN: ORAL | Status: DC

## 2015-07-18 NOTE — Progress Notes (Signed)
HPI: This is a   very pleasant 34 year old man whom I am meeting for the first time today     Chief complaint is change in bowels, constipation  neurosarcoid and pulm sar; started rituxan several months ago.  He was on prednisone, became constipated.  He actually lost weight.  He has tightness, numbness belt like around his lower chest that radiates down to his legs.  Has mild constipation, never sees blood.  Improved with higher fiber foods.  Has to push and strain at times.  NO colon cancer or colon polyps in his family.  Tries to have a BM daily.    Will usually have BM 1-2 times per week.  Constipation really started around the same time that he was diagnosed with sarcoid last year.        Review of systems: Pertinent positive and negative review of systems were noted in the above HPI section. Complete review of systems was performed and was otherwise normal.   Past Medical History  Diagnosis Date  . Asthma   . Subacute transverse myelitis   . Bronchitis   . GERD (gastroesophageal reflux disease)   . Sarcoidosis     Past Surgical History  Procedure Laterality Date  . No past surgeries    . Video bronchoscopy with endobronchial ultrasound N/A 09/08/2014    Procedure: VIDEO BRONCHOSCOPY WITH ENDOBRONCHIAL ULTRASOUND;  Surgeon: Brand Males, MD;  Location: [place] [facility];  Service: Thoracic;  Laterality: N/A;  . Mediastinoscopy N/A 10/13/2014    Procedure: MEDIASTINOSCOPY;  Surgeon: Melrose Nakayama, MD;  Location: [place];  Service: Thoracic;  Laterality: N/A;  . Lymph node biopsy N/A 10/13/2014    Procedure: LYMPH NODE BIOPSY;  Surgeon: Melrose Nakayama, MD;  Location: [facility];  Service: Thoracic;  Laterality: N/A;    Current Outpatient Prescriptions  Medication Sig Dispense Refill  . albuterol (PROVENTIL HFA;VENTOLIN HFA) 108 (90 BASE) MCG/ACT inhaler Inhale 2 puffs into the lungs every 6 (six) hours as needed for wheezing or shortness of breath. 1 Inhaler 6  .  docusate sodium (COLACE) 100 MG capsule Take 100 mg by mouth daily.    Marland Kitchen gabapentin (NEURONTIN) 800 MG tablet Take 800 mg by mouth 3 (three) times daily.    Marland Kitchen ibuprofen (ADVIL,MOTRIN) 200 MG tablet Take 200 mg by mouth every 6 (six) hours as needed for moderate pain.     . RiTUXimab (RITUXAN IV) Inject 1 each into the vein every 6 (six) months.     . famotidine (PEPCID) 20 MG tablet One at bedtime (Patient not taking: Reported on 07/18/2015) 30 tablet 2  . pantoprazole (PROTONIX) 40 MG tablet Take 1 tablet (40 mg total) by mouth daily. (Patient not taking: Reported on 07/18/2015) 30 tablet 11   No current facility-administered medications for this visit.    Allergies as of 07/18/2015  . (No Known Allergies)    Family History  Problem Relation Age of Onset  . Diabetes Maternal Grandmother   . Thyroid disease Mother   . Seizures Maternal Grandfather   . Seizures Father     Social History   Social History  . Marital Status: Single    Spouse Name: N/A  . Number of Children: 0  . Years of Education: 16   Occupational History  . unemployed    Social History Main Topics  . Smoking status: Never Smoker   . Smokeless tobacco: Never Used     Comment: pt states he smoked socially  . Alcohol Use: Yes  Comment: occassional  . Drug Use: No     Comment: used to smoke marijuana  . Sexual Activity: Not on file   Other Topics Concern  . Not on file   Social History Narrative     Physical Exam: Ht 5' 6.5" (1.689 m)  Wt 196 lb (88.905 kg)  BMI 31.16 kg/m2 Constitutional: generally well-appearing Psychiatric: alert and oriented x3 Eyes: extraocular movements intact Mouth: oral pharynx moist, no lesions Neck: supple no lymphadenopathy Cardiovascular: heart regular rate and rhythm Lungs: clear to auscultation bilaterally Abdomen: soft, nontender, nondistended, no obvious ascites, no peritoneal signs, normal bowel sounds Extremities: no lower extremity edema bilaterally Skin:  no lesions on visible extremities   Assessment and plan: 34 y.o. male with  change in bowels, constipation  I think it is unlikely that this is from anything serious. He did try fiber supplements in the past for about a month and didn't really notice much improvement and so since he failed that trial I recommended we proceed with colonoscopy at his soonest convenience to rule out significant anatomic issues, stricturing, large polyps or tumors.   Owens Loffler, MD [facility] 07/18/2015, 10:19 AM  Cc: Delilah Shan, MD

## 2015-07-18 NOTE — Patient Instructions (Addendum)
You will be set up for a colonoscopy for change in bowels, constipation.  You have been scheduled for a colonoscopy. Please follow written instructions given to you at your visit today.  Please pick up your prep supplies at the pharmacy within the next 1-3 days. If you use inhalers (even only as needed), please bring them with you on the day of your procedure. Your physician has requested that you go to www.startemmi.com and enter the access code given to you at your visit today. This web site gives a general overview about your procedure. However, you should still follow specific instructions given to you by our office regarding your preparation for the procedure.

## 2015-08-03 ENCOUNTER — Ambulatory Visit (AMBULATORY_SURGERY_CENTER): Payer: Self-pay | Admitting: Gastroenterology

## 2015-08-03 ENCOUNTER — Encounter: Payer: Self-pay | Admitting: Gastroenterology

## 2015-08-03 VITALS — BP 101/56 | HR 65 | Temp 97.2°F | Resp 16 | Ht 66.5 in | Wt 196.0 lb

## 2015-08-03 DIAGNOSIS — K635 Polyp of colon: Secondary | ICD-10-CM

## 2015-08-03 DIAGNOSIS — D122 Benign neoplasm of ascending colon: Secondary | ICD-10-CM

## 2015-08-03 DIAGNOSIS — R194 Change in bowel habit: Secondary | ICD-10-CM

## 2015-08-03 MED ORDER — SODIUM CHLORIDE 0.9 % IV SOLN: 500.0000 mL | INTRAVENOUS | Status: DC

## 2015-08-03 NOTE — Patient Instructions (Signed)
YOU HAD AN ENDOSCOPIC PROCEDURE TODAY AT THE [facility]:   Refer to the procedure report that was given to you for any specific questions about what was found during the examination.  If the procedure report does not answer your questions, please call your gastroenterologist to clarify.  If you requested that your care partner not be given the details of your procedure findings, then the procedure report has been included in a sealed envelope for you to review at your convenience later.  YOU SHOULD EXPECT: Some feelings of bloating in the abdomen. Passage of more gas than usual.  Walking can help get rid of the air that was put into your GI tract during the procedure and reduce the bloating. If you had a lower endoscopy (such as a colonoscopy or flexible sigmoidoscopy) you may notice spotting of blood in your stool or on the toilet paper. If you underwent a bowel prep for your procedure, you may not have a normal bowel movement for a few days.  Please Note:  You might notice some irritation and congestion in your nose or some drainage.  This is from the oxygen used during your procedure.  There is no need for concern and it should clear up in a Amayah Staheli or so.  SYMPTOMS TO REPORT IMMEDIATELY:   Following lower endoscopy (colonoscopy or flexible sigmoidoscopy):  Excessive amounts of blood in the stool  Significant tenderness or worsening of abdominal pains  Swelling of the abdomen that is new, acute  Fever of 100F or higher  For urgent or emergent issues, a gastroenterologist can be reached at any hour by calling [phone].   DIET: Your first meal following the procedure should be a small meal and then it is ok to progress to your normal diet. Heavy or fried foods are harder to digest and may make you feel nauseous or bloated.  Likewise, meals heavy in dairy and vegetables can increase bloating.  Drink plenty of fluids but you should avoid alcoholic beverages for 24  hours.  ACTIVITY:  You should plan to take it easy for the rest of today and you should NOT DRIVE or use heavy machinery until tomorrow (because of the sedation medicines used during the test).    FOLLOW UP: Our staff will call the number listed on your records the next business [place] following your procedure to check on you and address any questions or concerns that you may have regarding the information given to you following your procedure. If we do not reach you, we will leave a message.  However, if you are feeling well and you are not experiencing any problems, there is no need to return our call.  We will assume that you have returned to your regular daily activities without incident.  If any biopsies were taken you will be contacted by phone or by letter within the next 1-3 weeks.  Please call us at [phone] if you have not heard about the biopsies in 3 weeks.    SIGNATURES/CONFIDENTIALITY: You and/or your care partner have signed paperwork which will be entered into your electronic medical record.  These signatures attest to the fact that that the information above on your After Visit Summary has been reviewed and is understood.  Full responsibility of the confidentiality of this discharge information lies with you and/or your care-partner.  Please review polyp handout provided. Next colonoscopy determined by pathology results.  

## 2015-08-03 NOTE — Progress Notes (Signed)
Report to PACU, RN, vss, BBS= Clear.  

## 2015-08-03 NOTE — Progress Notes (Signed)
Called to room to assist during endoscopic procedure.  Patient ID and intended procedure confirmed with present staff. Received instructions for my participation in the procedure from the performing physician.  

## 2015-08-03 NOTE — Op Note (Signed)
[facility] [place]  [address]. [place], [id number]   COLONOSCOPY PROCEDURE REPORT  PATIENT: Gregory Fuller, Gregory Fuller  MR#: [id number] BIRTHDATE: 11-01-1981 , 34  yrs. old GENDER: male ENDOSCOPIST: Milus Banister, MD REFERRED [id number] Claiborne Billings, M.D. PROCEDURE DATE:  08/03/2015 PROCEDURE:   Colonoscopy, diagnostic and Colonoscopy with biopsy First Screening Colonoscopy - Avg.  risk and is 50 yrs.  old or older - No.  Prior Negative Screening - Now for repeat screening. N/A  History of Adenoma - Now for follow-up colonoscopy & has been > or = to 3 yrs.  N/A  Recommend repeat exam, <10 yrs? No ASA CLASS:   Class II INDICATIONS:change in bowels. MEDICATIONS: Monitored anesthesia care and Propofol 230 mg IV  DESCRIPTION OF PROCEDURE:   After the risks benefits and alternatives of the procedure were thoroughly explained, informed consent was obtained.  The digital rectal exam revealed no abnormalities of the rectum.   The LB [id number]  endoscope was introduced through the anus and advanced to the cecum, which was identified by both the appendix and ileocecal valve. No adverse events experienced.   The quality of the prep was excellent.  The instrument was then slowly withdrawn as the colon was fully examined. Estimated blood loss is zero unless otherwise noted in this procedure report.      COLON FINDINGS: A sessile polyp measuring 3 mm in size was found in the ascending colon.  A polypectomy was performed with a cold snare.  The resection was complete, the polyp tissue was completely retrieved and sent to histology.   The examination was otherwise normal.  Retroflexed views revealed no abnormalities. The time to cecum = 3.2 Withdrawal time = 6.2   The scope was withdrawn and the procedure completed. COMPLICATIONS: There were no immediate complications.  ENDOSCOPIC IMPRESSION: 1.   Sessile polyp was found in the ascending colon; polypectomy was performed with a cold  snare 2.   The examination was otherwise normal  RECOMMENDATIONS: If the polyp(s) removed today are proven to be adenomatous (pre-cancerous) polyps, you will need a repeat colonoscopy in 5 years.  Otherwise you should continue to follow colorectal cancer screening guidelines for "routine risk" patients with colonoscopy at age 14. Please try once daily miralax powder for your constipation and call Dr. Ardis Hughs' office to report on your response in 4-5 weeks.  eSigned:  Milus Banister, MD 08/03/2015 10:34 AM

## 2015-08-04 ENCOUNTER — Telehealth: Payer: Self-pay | Admitting: *Deleted

## 2015-08-04 NOTE — Telephone Encounter (Signed)
  Follow up Call-  Call back number 08/03/2015  Post procedure Call Back phone  # [phone]  Permission to leave phone message Yes     Patient questions:  Do you have a fever, pain , or abdominal swelling? No. Pain Score  0 *  Have you tolerated food without any problems? Yes.    Have you been able to return to your normal activities? Yes.    Do you have any questions about your discharge instructions: Diet   No. Medications  No. Follow up visit  No.  Do you have questions or concerns about your Care? No.  Actions: * If pain score is 4 or above: No action needed, pain <4.

## 2015-08-25 IMAGING — CT CT CHEST W/ CM
2 of 4 series · 15 of 36 positions shown, 18 images · IV contrast (Omni 300)
Comparison: Multiple exams, including 08/04/2014

CLINICAL DATA: Hilar prominence on chest radiates atrophy, query
adenopathy. Query sarcoidosis. The patient has a encephalomyelitis
based on narrow imaging.

EXAM:
CT CHEST WITH CONTRAST
TECHNIQUE: Multidetector CT imaging of the chest was performed during
intravenous contrast administration.
CONTRAST:  80mL OMNIPAQUE IOHEXOL 300 MG/ML  SOLN

[Series 2: thorax 5.0 i31f 1 · axial · 0.75mm/px · z∈[-330,-60]mm · 12 of 64 slices shown, 15 images]
[im 5/64  mediastinal]
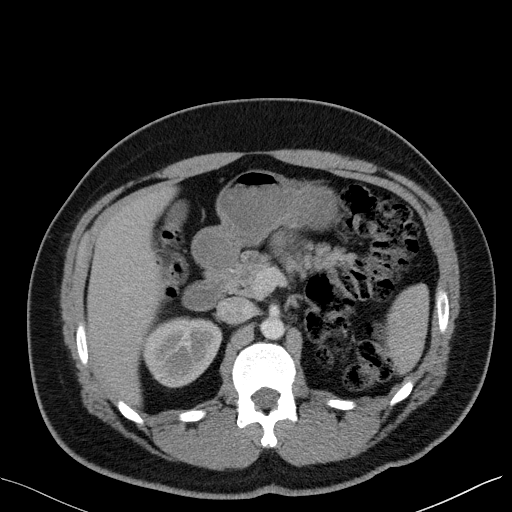
[im 5/64  lung]
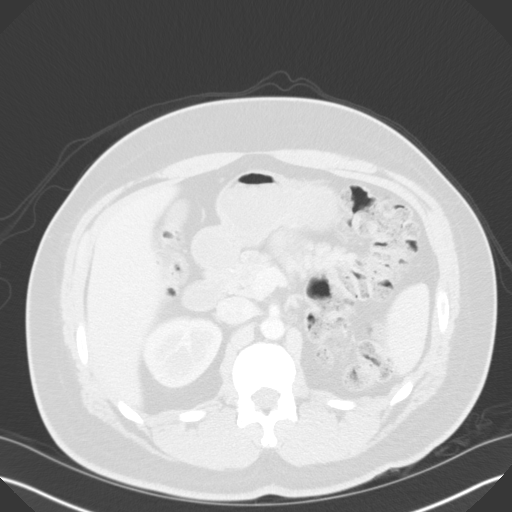
[im 10/64  lung]
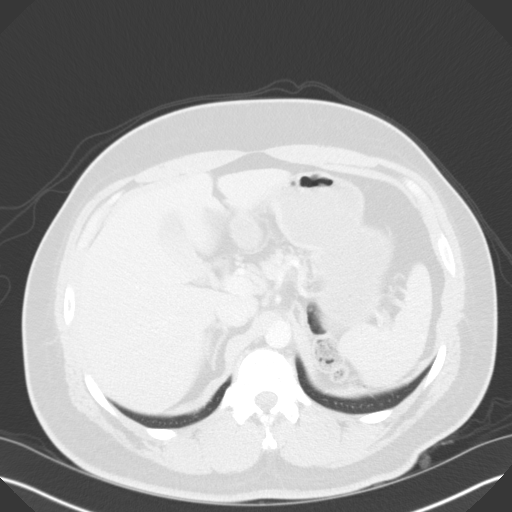
[im 14/64  lung]
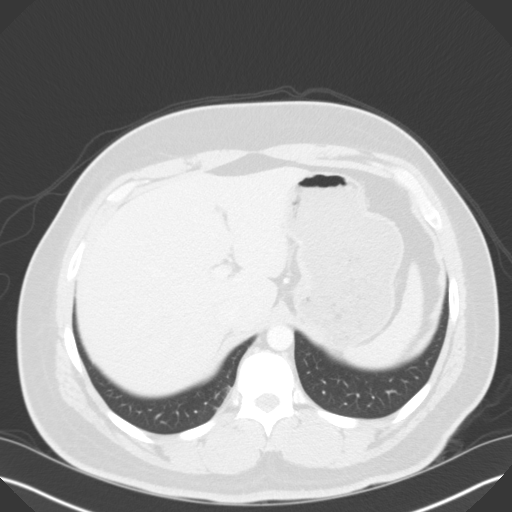
[im 19/64  lung]
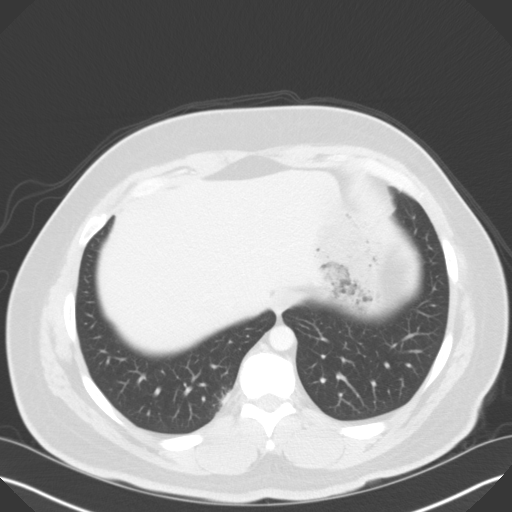
[im 23/64  mediastinal]
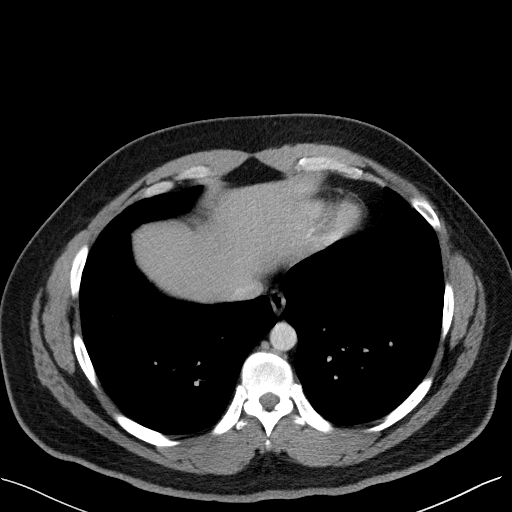
[im 23/64  lung]
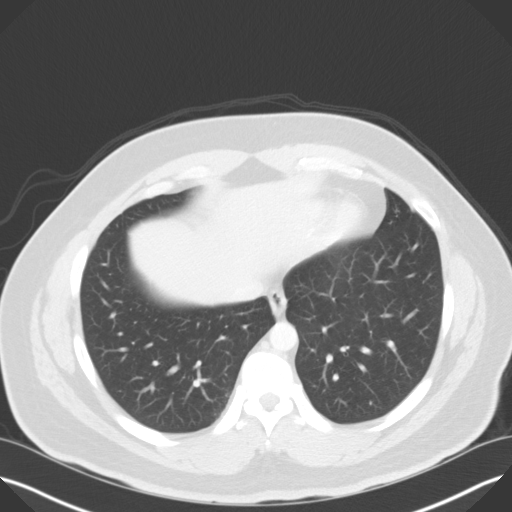
[im 28/64  lung]
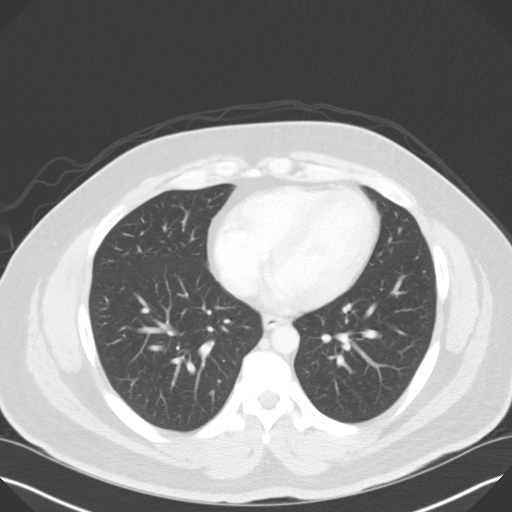
[im 37/64  lung]
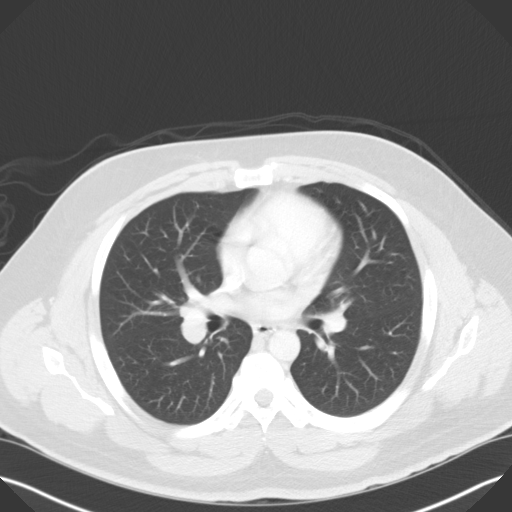
[im 41/64  lung]
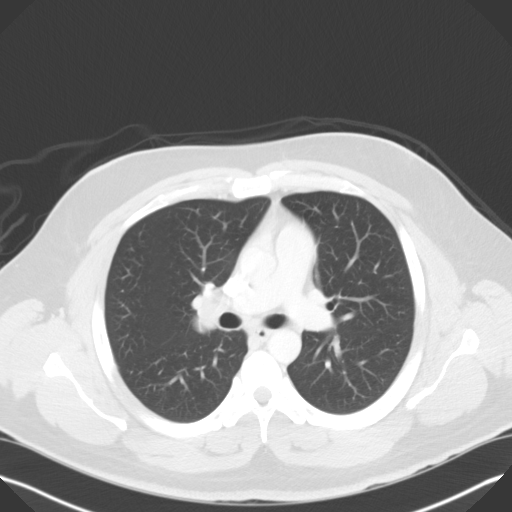
[im 46/64  mediastinal]
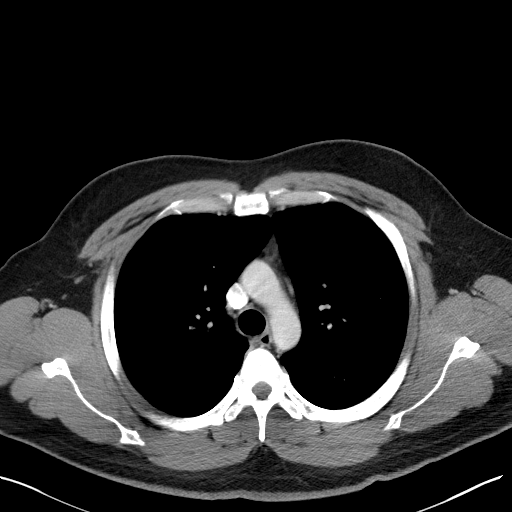
[im 46/64  lung]
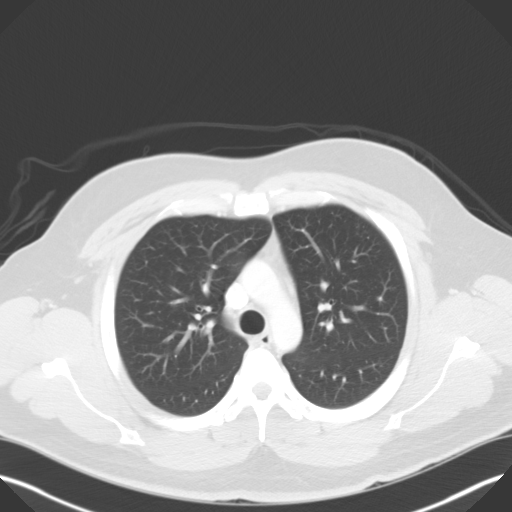
[im 50/64  lung]
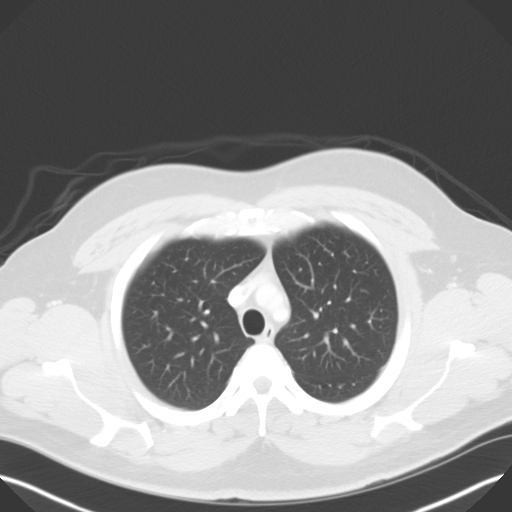
[im 55/64  lung]
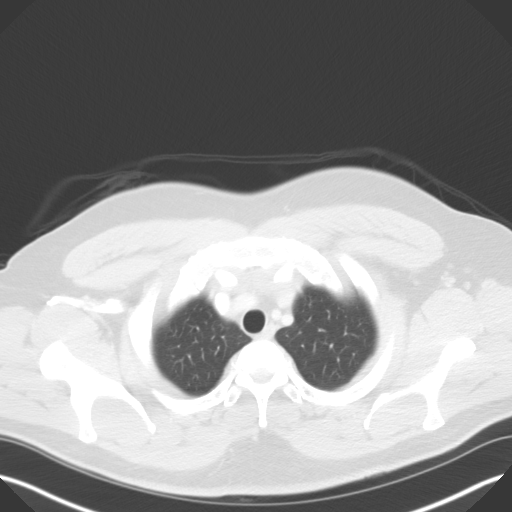
[im 59/64  lung]
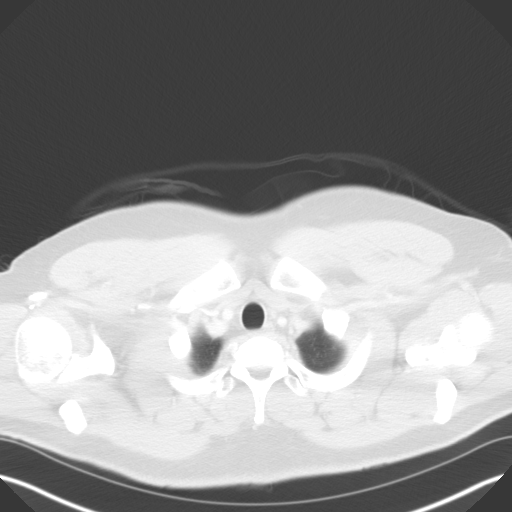

[Series 5: coronal · coronal · 0.67mm/px · 3 of 77 slices shown]
[im 16/77  lung]
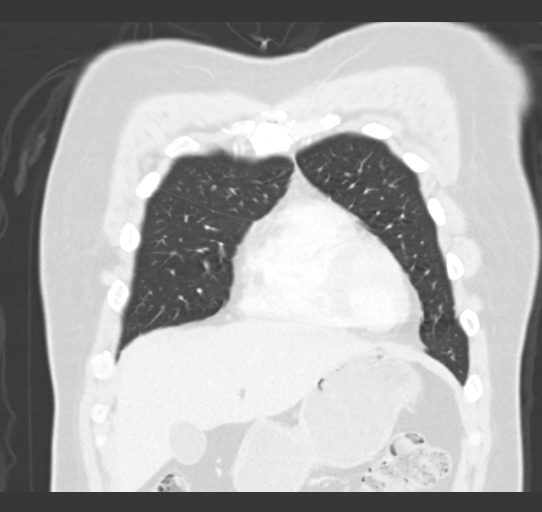
[im 31/77  lung]
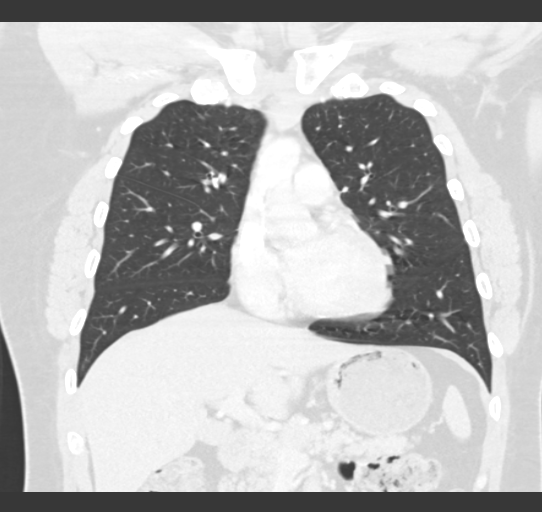
[im 46/77  lung]
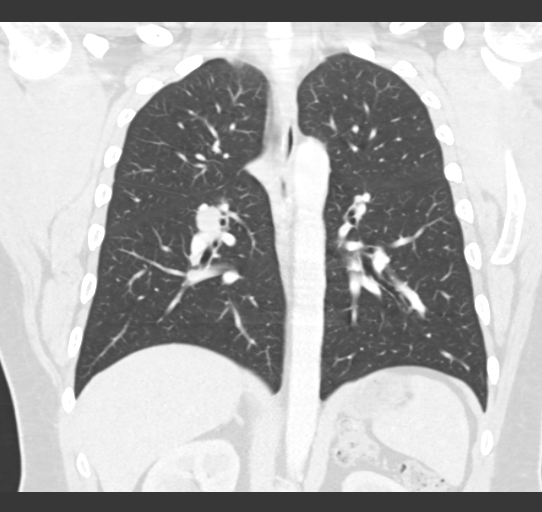

[15 of 36 positions shown; findings below may reference images not displayed]

FINDINGS: CT confirms right hilar and infrahilar adenopathy, with a right
posterior hilar node measuring 1.7 cm in short axis and a right
infrahilar node measuring 1.6 cm in short axis on images 25 and 27,
respectively. Additional enlarged infrahilar and hilar nodes are
present on the right side. No adenopathy on the left or in the
remainder the mediastinum. No axillary or internal mammary
adenopathy. No definite regional endobronchial lesion or significant
extrinsic narrowing of the airways.

Peripherally in the left lower lobe there is a pleural based
slightly irregularly marginated lesion on image 43 of series 3
measuring 1.4 cm at its pleural base and 0.7 cm in height. Adjacent
to this is a flat but slightly irregular 2.8 by 0.5 cm subpleural
lesion. There is a 4 mm left lower lobe nodule on image 35 of series
3 which appears noncalcified.

No definite nodular airway thickening.
IMPRESSION: 1. Pathologic right hilar and infrahilar adenopathy but without left
hilar or mediastinal adenopathy, and with slightly irregular pleural
based lesions medially in the right lower lobe in this patient with
known segmental regions of dural enhancement and myelitis throughout
the spinal cord and in portions of the brain. A unifying diagnosis
is problematic as the imaging appearance is not considered highly
characteristic for a specific disease process. The unilateral hilar
adenopathy and lack of nodular airway thickening would be unusual
for sarcoidosis with neurosarcoidosis as the underlying cause, but
it would also be unusual for this to represent a non-small cell lung
cancer with dural metastatic disease and paraneoplastic myelitis and
cerebritis given the patient's young age. Correlation with serum ACE
levels suggested in working up the sarcoidosis angle although these
are not entirely specific. Bronchoscopic biopsy of the lymph nodes
may provide useful diagnostic information. With regard to the
pleural-based lesions, I am uncertain whether these are true lesions
or simply represent regional atelectasis. There is an adjacent 4 mm
nodule in the right lower lobe, too small to biopsy percutaneously.
Entities such as right-sided tuberculosis with TB involvement of the
derived likewise seem unlikely.

## 2015-09-12 ENCOUNTER — Telehealth: Payer: Self-pay | Admitting: Internal Medicine

## 2015-09-12 NOTE — Telephone Encounter (Signed)
Error.Gregory Fuller ° °

## 2015-09-16 ENCOUNTER — Encounter: Payer: Self-pay | Admitting: Internal Medicine

## 2015-09-16 ENCOUNTER — Ambulatory Visit (INDEPENDENT_AMBULATORY_CARE_PROVIDER_SITE_OTHER): Payer: Self-pay | Admitting: Internal Medicine

## 2015-09-16 VITALS — BP 140/88 | HR 66 | Ht 66.5 in | Wt 193.0 lb

## 2015-09-16 DIAGNOSIS — R590 Localized enlarged lymph nodes: Secondary | ICD-10-CM

## 2015-09-16 DIAGNOSIS — D8689 Sarcoidosis of other sites: Secondary | ICD-10-CM

## 2015-09-16 DIAGNOSIS — R599 Enlarged lymph nodes, unspecified: Secondary | ICD-10-CM

## 2015-09-16 DIAGNOSIS — J018 Other acute sinusitis: Secondary | ICD-10-CM

## 2015-09-16 MED ORDER — CEPHALEXIN 500 MG PO CAPS: 500.0000 mg | ORAL_CAPSULE | Freq: Three times a day (TID) | ORAL | Status: DC

## 2015-09-16 NOTE — Progress Notes (Signed)
Subjective:     Patient ID: Gregory Fuller, male   DOB: 11/30/1980, 34 y.o.   MRN: [id number]  HPI  OV 09/16/2015  Chief Complaint  Patient presents with  . Acute Visit    Pt states he is now feeling much better. Pt c/o morning cough with yellow mucus and chest tightness when active under BIL breast. Pt denies SOB and f/c/s.    Follow-up mediastinal adenopathy in the setting of neurosarcoidosis versus multiple sclerosis. On IV Rituxan through neurology department at [facility]. He is at baseline. He does not have any active pulmonary complaints. He follows here because we originally made the diagnosis. And he prefers to follow up and be in touch with [place]. Most recently he went to [place] and came back a few days ago. He got a little bit dizzy when he was sitting backwards in the train. Otherwise since arrival he's having some sinus congestion with yellow sinus drainage but no fevers. Last Rituxan was several months ago and he is on Rituxan every 6 months. He had to have his flu shot this season. This no fever or chills or wheezing. He continues to have his baseline midthoracic pain  Immunization History  Administered Date(s) Administered  . Influenza,inj,Quad PF,36+ Mos 08/09/2014  . Pneumococcal-Unspecified 03/07/2015     Current outpatient prescriptions:  .  albuterol (PROVENTIL HFA;VENTOLIN HFA) 108 (90 BASE) MCG/ACT inhaler, Inhale 2 puffs into the lungs every 6 (six) hours as needed for wheezing or shortness of breath., Disp: 1 Inhaler, Rfl: 6 .  docusate sodium (COLACE) 100 MG capsule, Take 100 mg by mouth daily as needed for mild constipation., Disp: , Rfl:  .  gabapentin (NEURONTIN) 800 MG tablet, Take 800 mg by mouth 3 (three) times daily., Disp: , Rfl:  .  ibuprofen (ADVIL,MOTRIN) 200 MG tablet, Take 200 mg by mouth every 6 (six) hours as needed for moderate pain. , Disp: , Rfl:  .  RiTUXimab (RITUXAN IV), Inject 1 each into the vein every 6 (six) months. , Disp: , Rfl:  .   pantoprazole (PROTONIX) 40 MG tablet, Take 1 tablet (40 mg total) by mouth daily. (Patient not taking: Reported on 09/16/2015), Disp: 30 tablet, Rfl: 11    Review of Systems According to history of present illness    Objective:   Physical Exam  Constitutional: He is oriented to person, place, and time. He appears well-developed and well-nourished. No distress.  HENT:  Head: Normocephalic and atraumatic.  Right Ear: External ear normal.  Left Ear: External ear normal.  Mouth/Throat: Oropharynx is clear and moist. No oropharyngeal exudate.  Sinus turbinates mildly congested  Eyes: Conjunctivae and EOM are normal. Pupils are equal, round, and reactive to light. Right eye exhibits no discharge. Left eye exhibits no discharge. No scleral icterus.  Neck: Normal range of motion. Neck supple. No JVD present. No tracheal deviation present. No thyromegaly present.  Cardiovascular: Normal rate, regular rhythm and intact distal pulses.  Exam reveals no gallop and no friction rub.   No murmur heard. Pulmonary/Chest: Effort normal and breath sounds normal. No respiratory distress. He has no wheezes. He has no rales. He exhibits no tenderness.  Abdominal: Soft. Bowel sounds are normal. He exhibits no distension and no mass. There is no tenderness. There is no rebound and no guarding.  Musculoskeletal: Normal range of motion. He exhibits no edema or tenderness.  Lymphadenopathy:    He has no cervical adenopathy.  Neurological: He is alert and oriented to person, place,  and time. He has normal reflexes. No cranial nerve deficit. Coordination normal.  Skin: Skin is warm and dry. No rash noted. He is not diaphoretic. No erythema. No pallor.  Psychiatric: He has a normal mood and affect. His behavior is normal. Judgment and thought content normal.  Nursing note and vitals reviewed.   Filed Vitals:   09/16/15 1342  BP: 140/88  Pulse: 66  Height: 5' 6.5" (1.689 m)  Weight: 193 lb (87.544 kg)  SpO2:  99%         Assessment:       ICD-9-CM ICD-10-CM   1. Other acute sinusitis 461.8 J01.80   2. Mediastinal lymphadenopathy 785.6 R59.9   3. Neurosarcoidosis in adult [facility]) 135 D86.89        Plan:      Cephalexin 500mg  three times daily x 5 days Flu shot next week anywhere Followup Rituxan at [facility] Followup with me in 9 months   Dr. Brand Males, M.D., [facility].C.P Pulmonary and Critical Care Medicine Staff Physician [facility] Pager: [phone], If no answer or between  15:00h - 7:00h: call [phone]  09/16/2015 2:05 PM

## 2015-09-16 NOTE — Patient Instructions (Signed)
ICD-9-CM ICD-10-CM   1. Other acute sinusitis 461.8 J01.80   2. Mediastinal lymphadenopathy 785.6 R59.9   3. Neurosarcoidosis in adult [facility]) 135 D86.89     Cephalexin 500mg  three times daily x 5 days Flu shot next week anywhere Followup Rituxan at [facility] Followup with me in 9 months

## 2015-11-18 ENCOUNTER — Telehealth: Payer: Self-pay | Admitting: Family Medicine

## 2015-11-18 NOTE — Telephone Encounter (Signed)
Left message for patient to call to reschedule cancelled appointment.

## 2015-11-28 MED FILL — ?PANTOPRAZOLE SOD DR 40MG: 40 MG | 30 days supply | Qty: 30 | Fill #1

## 2015-11-28 MED FILL — GABAPENTIN 400 MG CAPSULE: 400 | 30 days supply | Qty: 180 | Fill #4

## 2015-12-02 ENCOUNTER — Ambulatory Visit: Payer: Self-pay | Admitting: Family Medicine

## 2015-12-07 ENCOUNTER — Other Ambulatory Visit (INDEPENDENT_AMBULATORY_CARE_PROVIDER_SITE_OTHER): Payer: Self-pay

## 2015-12-07 ENCOUNTER — Encounter: Payer: Self-pay | Admitting: *Deleted

## 2015-12-07 ENCOUNTER — Ambulatory Visit (INDEPENDENT_AMBULATORY_CARE_PROVIDER_SITE_OTHER)
Admission: RE | Admit: 2015-12-07 | Discharge: 2015-12-07 | Disposition: A | Discharge status: Home / Self Care | Payer: Self-pay | Source: Ambulatory Visit | Attending: Acute Care | Admitting: Acute Care

## 2015-12-07 ENCOUNTER — Encounter: Payer: Self-pay | Admitting: Acute Care

## 2015-12-07 ENCOUNTER — Ambulatory Visit (INDEPENDENT_AMBULATORY_CARE_PROVIDER_SITE_OTHER): Payer: Self-pay | Admitting: Acute Care

## 2015-12-07 VITALS — BP 132/80 | HR 75 | Wt 188.4 lb

## 2015-12-07 DIAGNOSIS — D8689 Sarcoidosis of other sites: Secondary | ICD-10-CM

## 2015-12-07 DIAGNOSIS — R0600 Dyspnea, unspecified: Secondary | ICD-10-CM

## 2015-12-07 DIAGNOSIS — J4520 Mild intermittent asthma, uncomplicated: Secondary | ICD-10-CM

## 2015-12-07 LAB — BASIC METABOLIC PANEL
BUN: 12 mg/dL (ref 6–23)
CALCIUM: 9.6 mg/dL (ref 8.4–10.5)
CO2: 27 mEq/L (ref 19–32)
CREATININE: 1.03 mg/dL (ref 0.40–1.50)
Chloride: 103 mEq/L (ref 96–112)
GFR: 106.59 mL/min (ref >60.00)
Glucose, Bld: 91 mg/dL (ref 70–99)
Potassium: 4.1 mEq/L (ref 3.5–5.1)
SODIUM: 138 meq/L (ref 135–145)

## 2015-12-07 LAB — CBC WITH DIFFERENTIAL/PLATELET
BASOS ABS: 0 10*3/uL (ref 0.0–0.1)
Basophils Relative: 0.4 % (ref 0.0–3.0)
EOS ABS: 0 10*3/uL (ref 0.0–0.7)
Eosinophils Relative: 0 % (ref 0.0–5.0)
HEMATOCRIT: 45.7 % (ref 39.0–52.0)
Hemoglobin: 15.1 g/dL (ref 13.0–17.0)
LYMPHS ABS: 1.2 10*3/uL (ref 0.7–4.0)
LYMPHS PCT: 19.8 % (ref 12.0–46.0)
MCHC: 33.2 g/dL (ref 30.0–36.0)
MCV: 81.7 fl (ref 78.0–100.0)
MONOS PCT: 9 % (ref 3.0–12.0)
Monocytes Absolute: 0.6 10*3/uL (ref 0.1–1.0)
NEUTROS ABS: 4.4 10*3/uL (ref 1.4–7.7)
NEUTROS PCT: 70.8 % (ref 43.0–77.0)
PLATELETS: 215 10*3/uL (ref 150.0–400.0)
RBC: 5.59 Mil/uL (ref 4.22–5.81)
RDW: 14.6 % (ref 11.5–15.5)
WBC: 6.2 10*3/uL (ref 4.0–10.5)

## 2015-12-07 LAB — D-DIMER, QUANTITATIVE (NOT AT ARMC): D DIMER QUANT: 0.27 ug{FEU}/mL (ref 0.00–0.48)

## 2015-12-07 NOTE — Assessment & Plan Note (Signed)
Continue Albuterol inhaler as needed, 2 puffs every 6 hours for Shortness of breath or wheezing.

## 2015-12-07 NOTE — Assessment & Plan Note (Addendum)
Worsening SOB from baseline. Compliant with protonix 40 mg QD regimen. Tightness in his chest on deep inspiration with left lower lobe pain. Using Albuterol as rescue. Recent car trip to [place] and back this weekend. Recent sick contacts with friends with pneumonia, bronchitis. HR 75, Sao2 100% on room air, BP: 132/80  Plan: CXR D-dimer CBC with Diff BMET Consider doppler studies and CT Angio if pneumonia is ruled out as possible cause, and d-dimer is elevated.

## 2015-12-07 NOTE — Addendum Note (Signed)
Addended by: Eric Form F on: 12/07/2015 06:18 PM   Modules accepted: Level of Service

## 2015-12-07 NOTE — Patient Instructions (Addendum)
It was nice to meet you today. Your chest xray was normal today. We will do blood work today. We will call you with results tomorrow. Try a heating pad and tylenol or ibuprofen for your pain. Continue your albuterol inhaler as needed for SOB or wheezing. Please contact office for sooner follow up if symptoms do not improve or worsen or seek emergency care . 6 month follow up with Dr. Chase Caller in April 2017

## 2015-12-07 NOTE — Progress Notes (Signed)
Chief Complaint  Patient presents with  . Acute Visit    MR pt. Pt reports at night he feels like he can't catch his breath, c/o pain middle of back, pain in top of abdomen.    PCP Nilda Simmer, MD  Carolan Shiver, MD - rheuma [address]  [place], [place] [address]  [phone]  [phone] (Fax)   Noni Saupe, MD  [address]  [id number] [place], [place] [address]  [phone]  [phone] (Fax)   Lawson Radar Matrunick (Attending) - neuro [phone] (Work) [phone])  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 Next dose due this month in [place].  Tests  12/07/15: CXR: IMPRESSION: Reactive airway disease. Mild interstitial prominence and hilar prominence may reflect known sarcoidosis. There is no pneumonia, CHF, nor other acute cardiopulmonary disease.   12/07/15  D-Dimer: pending WBC: 6.2K/uL Creatinine 1.03mg /dL Bun:12mg /dL Well's Score:0  Low probability, low risk group 1.3 % chance of PE in ED population.  12/07/15 Acute OV: Patient comes to the office today with complaint of shortness of breath and some lower back pain. Patient denies fever ,purulent sputum , cough, hemoptysis, chest pain, leg pain or swelling. He is in no distress.He did drive to [place] this weekend.He has had several sick contacts.CXR today indicates no pneumonia, CHF, nor other acute cardiopulmonary disease.   Past medical hx Past Medical History  Diagnosis Date  . Asthma   . Subacute transverse myelitis ([place])   . Bronchitis   . GERD (gastroesophageal reflux disease)   . Sarcoidosis ([place])      Past surgical hx, Allergies, Family hx, Social hx all reviewed.  Current Outpatient Prescriptions on File Prior to Visit  Medication Sig  . albuterol (PROVENTIL  HFA;VENTOLIN HFA) 108 (90 BASE) MCG/ACT inhaler Inhale 2 puffs into the lungs every 6 (six) hours as needed for wheezing or shortness of breath.  . docusate sodium (COLACE) 100 MG capsule Take 100 mg by mouth daily as needed for mild constipation.  . gabapentin (NEURONTIN) 800 MG tablet Take 800 mg by mouth 3 (three) times daily.  Marland Kitchen ibuprofen (ADVIL,MOTRIN) 200 MG tablet Take 200 mg by mouth every 6 (six) hours as needed for moderate pain.   . pantoprazole (PROTONIX) 40 MG tablet Take 1 tablet (40 mg total) by mouth daily.  . RiTUXimab (RITUXAN IV) Inject 1 each into the vein every 6 (six) months.    No current facility-administered medications on file prior to visit.    No Known Allergies   Constitutional:   No  weight loss, night sweats,  Fevers, chills, fatigue, or  lassitude.  HEENT:   No headaches,  Difficulty swallowing,  Tooth/dental problems, or  Sore throat,                No sneezing, itching, ear ache, nasal congestion, post nasal drip,   CV:  No chest pain,  Orthopnea, PND, swelling in lower extremities, anasarca, dizziness, palpitations, syncope.   GI  No heartburn, indigestion, abdominal pain, nausea, vomiting, diarrhea, change in bowel habits, loss of appetite, bloody stools.   Resp: + shortness of breath with exertion not at rest.  No excess mucus, no productive cough,  No non-productive cough,  No coughing up of blood.  No change in color of mucus.  No wheezing.  No chest wall deformity  Skin: no rash  or lesions.  GU: no dysuria, change in color of urine, no urgency or frequency.  No flank pain, no hematuria   MS:  No joint pain or swelling.  No decreased range of motion.  + back pain.  Psych:  No change in mood or affect. No depression or anxiety.  No memory loss.     Vital Signs BP 132/80 mmHg  Pulse 75  Wt 188 lb 6.4 oz (85.458 kg)  SpO2 100%     Physical Exam:   General - No distress  ENT - No sinus tenderness, no oral exudate, no LAN Cardiac - s1s2  regular, no murmur Chest - No wheeze/rales/dullness Back - No focal tenderness Abd - Soft, non-tender Ext - No edema, no redness or swelling, not warm to touch Neuro - Normal strength Skin - No rashes Psych - normal mood, and behavior   Assessment and Plan:

## 2015-12-08 ENCOUNTER — Telehealth: Payer: Self-pay | Admitting: Internal Medicine

## 2015-12-08 NOTE — Telephone Encounter (Signed)
Result Note     Please call Mr. Gregory Fuller and let him know that his D-Dimer was negative for thromboembolic disease. Tell him it is within normal range. Thanks so much  --  LMTCB x1

## 2015-12-09 NOTE — Telephone Encounter (Signed)
I spoke with patient about results and he verbalized understanding and had no questions 

## 2015-12-09 NOTE — Telephone Encounter (Signed)
LMOM TCB x2

## 2015-12-09 NOTE — Telephone Encounter (Signed)
Pt cb [phone]

## 2015-12-30 MED FILL — PANTOPRAZOLE SOD DR 40 MG T: 40 | 30 days supply | Qty: 30 | Fill #2

## 2015-12-30 MED FILL — GABAPENTIN 400 MG CAPSULE: 400 | 30 days supply | Qty: 180 | Fill #5

## 2016-01-25 MED FILL — GABAPENTIN 400 MG CAPSULE: 400 | 30 days supply | Qty: 180 | Fill #6

## 2016-02-06 ENCOUNTER — Ambulatory Visit: Payer: Self-pay | Admitting: Internal Medicine

## 2016-03-01 MED FILL — PANTOPRAZOLE SOD DR 40 MG T: 40 | 30 days supply | Qty: 30 | Fill #3 | Status: TO

## 2016-03-01 MED FILL — GABAPENTIN 400 MG CAPSULE: 400 | 30 days supply | Qty: 180 | Fill #7

## 2016-04-10 ENCOUNTER — Ambulatory Visit (INDEPENDENT_AMBULATORY_CARE_PROVIDER_SITE_OTHER): Payer: BLUE CROSS/BLUE SHIELD | Admitting: Internal Medicine

## 2016-04-10 ENCOUNTER — Encounter: Payer: Self-pay | Admitting: Internal Medicine

## 2016-04-10 VITALS — BP 134/82 | HR 63 | Ht 66.5 in | Wt 191.6 lb

## 2016-04-10 DIAGNOSIS — R590 Localized enlarged lymph nodes: Secondary | ICD-10-CM

## 2016-04-10 DIAGNOSIS — M5414 Radiculopathy, thoracic region: Secondary | ICD-10-CM

## 2016-04-10 DIAGNOSIS — D8689 Sarcoidosis of other sites: Secondary | ICD-10-CM | POA: Diagnosis not present

## 2016-04-10 DIAGNOSIS — R599 Enlarged lymph nodes, unspecified: Secondary | ICD-10-CM

## 2016-04-10 NOTE — Patient Instructions (Addendum)
ICD-9-CM ICD-10-CM   1. Neurosarcoidosis in adult [facility]) 135 D86.89 CT Chest W Contrast  2. Mediastinal lymphadenopathy 785.6 R59.9   3. Thoracic radiculopathy 724.4 M54.14     Glad you are better and MRI at [facility] March 2017 shows remission in spine Noted still some ongoing thopracic radiculopathy  Plan Do CT chest with contrst in oct 2017 ; 2 year followup of pulmonary sarcoid findings Avoid running and impact to spine Do swimming Consider upepr back and neck strengthening exercises with light load or through PT  -if you need referral let us know  Followup After ct chest in oct 2017 to see Dr Chase Caller in Oct 2017

## 2016-04-10 NOTE — Progress Notes (Signed)
Subjective:     Patient ID: KANDEN CAREY, male   DOB: 12-31-1980, 35 y.o.   MRN: [id number]  HPI   OV 09/16/2015  Chief Complaint  Patient presents with  . Acute Visit    Pt states he is now feeling much better. Pt c/o morning cough with yellow mucus and chest tightness when active under BIL breast. Pt denies SOB and f/c/s.    Follow-up mediastinal adenopathy in the setting of neurosarcoidosis versus multiple sclerosis. On IV Rituxan through neurology department at [facility]. He is at baseline. He does not have any active pulmonary complaints. He follows here because we originally made the diagnosis. And he prefers to follow up and be in touch with [place]. Most recently he went to [place] and came back a few days ago. He got a little bit dizzy when he was sitting backwards in the train. Otherwise since arrival he's having some sinus congestion with yellow sinus drainage but no fevers. Last Rituxan was several months ago and he is on Rituxan every 6 months. He had to have his flu shot this season. This no fever or chills or wheezing. He continues to have his baseline midthoracic pain   OV 04/10/2016  Chief Complaint  Patient presents with  . Follow-up    pt has no complaints today.       Follow-up 35 year old Afro-[place] pulmonary and neurosarcoidosisin the spine but with findings of MS in brain  He follows at [facility] and rheum. He is on Q6 months rituxan to cover for both sarcoid and MS. Last seen rheum there in mid march 2017. Note reviewed and MRI Results below reviewed (could not visualize image). HE is in remission. Lst Ritixuaan feb 2017 . Next Aug 2017. Still with some mild on and  Off T spine radiculopathy. STble  . Unchanged. No neuro deficits. Noted doing high spine impact running and also lfiting heavy weights whcihi s sometimes and periodically excerbating problems   MRI T spoine 01/07/16: IMPRESSION:at [facility] Interval resolution of multifocal enhancement of the cervical/thoracic  spinal cord compared to prior examination 01/19/15. Slightly improved abnormal T2 signal without cord expansion. No new or enhancing lesions.   MRI brain 34/17IMPRESSION: Stable white matter lesion burden compared to study of 01/19/15, compatible with multiple sclerosis. No new or enhancing lesions. Normal optic nerves    has a past medical history of Asthma; Subacute transverse myelitis ([place]); Bronchitis; GERD (gastroesophageal reflux disease); and Sarcoidosis ([place]).   reports that he has never smoked. He has never used smokeless tobacco.  Past Surgical History  Procedure Laterality Date  . No past surgeries    . Video bronchoscopy with endobronchial ultrasound N/A 09/08/2014    Procedure: VIDEO BRONCHOSCOPY WITH ENDOBRONCHIAL ULTRASOUND;  Surgeon: Brand Males, MD;  Location: [facility];  Service: Thoracic;  Laterality: N/A;  . Mediastinoscopy N/A 10/13/2014    Procedure: MEDIASTINOSCOPY;  Surgeon: Melrose Nakayama, MD;  Location: [place];  Service: Thoracic;  Laterality: N/A;  . Lymph node biopsy N/A 10/13/2014    Procedure: LYMPH NODE BIOPSY;  Surgeon: Melrose Nakayama, MD;  Location: [place];  Service: Thoracic;  Laterality: N/A;    No Known Allergies  Immunization History  Administered Date(s) Administered  . Influenza Split 08/06/2015  . Influenza,inj,Quad PF,36+ Mos 08/09/2014  . Pneumococcal-Unspecified 03/07/2015    Family History  Problem Relation Age of Onset  . Diabetes Maternal Grandmother   . Thyroid disease Mother   . Seizures Maternal Grandfather   . Seizures  Father      Current outpatient prescriptions:  .  albuterol (PROVENTIL HFA;VENTOLIN HFA) 108 (90 BASE) MCG/ACT inhaler, Inhale 2 puffs into the lungs every 6 (six) hours as needed for wheezing or shortness of breath., Disp: 1 Inhaler, Rfl: 6 .  docusate sodium (COLACE) 100 MG capsule, Take 100 mg by mouth daily as needed for mild constipation. Reported on 04/10/2016, Disp: , Rfl:  .  gabapentin  (NEURONTIN) 800 MG tablet, Take 800 mg by mouth 3 (three) times daily., Disp: , Rfl:  .  ibuprofen (ADVIL,MOTRIN) 200 MG tablet, Take 200 mg by mouth every 6 (six) hours as needed for moderate pain. , Disp: , Rfl:  .  pantoprazole (PROTONIX) 40 MG tablet, Take 1 tablet (40 mg total) by mouth daily., Disp: 30 tablet, Rfl: 11 .  RiTUXimab (RITUXAN IV), Inject 1 each into the vein every 6 (six) months. , Disp: , Rfl:  .  terbinafine (LAMISIL) 250 MG tablet, Take 250 mg by mouth daily., Disp: , Rfl:      Review of Systems     Objective:   Physical Exam  Constitutional: He is oriented to person, place, and time. He appears well-developed and well-nourished. No distress.  HENT:  Head: Normocephalic and atraumatic.  Right Ear: External ear normal.  Left Ear: External ear normal.  Mouth/Throat: Oropharynx is clear and moist. No oropharyngeal exudate.  Eyes: Conjunctivae and EOM are normal. Pupils are equal, round, and reactive to light. Right eye exhibits no discharge. Left eye exhibits no discharge. No scleral icterus.  Neck: Normal range of motion. Neck supple. No JVD present. No tracheal deviation present. No thyromegaly present.  Cardiovascular: Normal rate, regular rhythm and intact distal pulses.  Exam reveals no gallop and no friction rub.   No murmur heard. Pulmonary/Chest: Effort normal and breath sounds normal. No respiratory distress. He has no wheezes. He has no rales. He exhibits no tenderness.  Abdominal: Soft. Bowel sounds are normal. He exhibits no distension and no mass. There is no tenderness. There is no rebound and no guarding.  Musculoskeletal: Normal range of motion. He exhibits no edema or tenderness.  Lymphadenopathy:    He has no cervical adenopathy.  Neurological: He is alert and oriented to person, place, and time. He has normal reflexes. No cranial nerve deficit. Coordination normal.  Skin: Skin is warm and dry. No rash noted. He is not diaphoretic. No erythema. No  pallor.  Psychiatric: He has a normal mood and affect. His behavior is normal. Judgment and thought content normal.  Nursing note and vitals reviewed.   Filed Vitals:   04/10/16 1623  BP: 134/82  Pulse: 63  Height: 5' 6.5" (1.689 m)  Weight: 191 lb 9.6 oz (86.909 kg)  SpO2: 100%        Assessment:       ICD-9-CM ICD-10-CM   1. Neurosarcoidosis in adult [facility]) 135 D86.89 CT Chest W Contrast  2. Mediastinal lymphadenopathy 785.6 R59.9   3. Thoracic radiculopathy 724.4 M54.14        Plan:      Glad you are better and MRI at [facility] March 2017 shows remission in spine Noted still some ongoing thopracic radiculopathy  Plan Do CT chest with contrst in oct 2017 ; 2 year followup of pulmonary sarcoid findings Avoid running and impact to spine Do swimming Consider upepr back and neck strengthening exercises with light load or through PT  -if you need referral let us know  Followup After ct chest in  oct 2017 to see Dr Chase Caller in Oct 2017   > 50% of this > 25 min visit spent in face to face counseling or coordination of care     Dr. Brand Males, M.D., [facility].C.P Pulmonary and Critical Care Medicine Staff Physician [place] [facility] Pager: [phone], If no answer or between  15:00h - 7:00h: call [phone]  04/10/2016 5:07 PM

## 2016-05-28 ENCOUNTER — Telehealth: Payer: Self-pay | Admitting: Internal Medicine

## 2016-05-28 MED ORDER — DOXYCYCLINE HYCLATE 100 MG PO TABS: 100.0000 mg | ORAL_TABLET | Freq: Two times a day (BID) | ORAL | 0 refills | Status: DC

## 2016-05-28 NOTE — Telephone Encounter (Signed)
Pt c/o sorethroat, chest congestion, prod cough (yellow), fever of 101, runny nose.  Denies sob or wheezing.  Please advise  No Known Allergies

## 2016-05-28 NOTE — Telephone Encounter (Signed)
Take doxycycline 100mg  po twice daily x 5 days; take after meals and avoid sunlight   Dr. Brand Males, M.D., [facility].C.P Pulmonary and Critical Care Medicine Staff Physician [place] [facility] Pager: [phone], If no answer or between  15:00h - 7:00h: call [phone]  05/28/2016 3:15 PM

## 2016-05-28 NOTE — Telephone Encounter (Signed)
LMOMTCB x 1 

## 2016-05-28 NOTE — Telephone Encounter (Signed)
Spoke with pt and advised of Dr Golden Pop recommendations.  Rx sent to pharmacy.

## 2016-06-04 ENCOUNTER — Other Ambulatory Visit: Payer: Self-pay | Admitting: Internal Medicine

## 2016-06-11 ENCOUNTER — Telehealth: Payer: Self-pay | Admitting: Internal Medicine

## 2016-06-11 MED ORDER — ALBUTEROL SULFATE HFA 108 (90 BASE) MCG/ACT IN AERS: INHALATION_SPRAY | RESPIRATORY_TRACT | 5 refills | Status: DC

## 2016-06-11 NOTE — Telephone Encounter (Signed)
Called and spoke to pt. Pt is requesting proventil be sent to a different pharmacy. New pharmacy verified, rx sent. Cancelled rx at previous pharmacy. Pt verbalized understanding and denied any further questions or concerns at this time.

## 2016-08-21 ENCOUNTER — Ambulatory Visit (INDEPENDENT_AMBULATORY_CARE_PROVIDER_SITE_OTHER)
Admission: RE | Admit: 2016-08-21 | Discharge: 2016-08-21 | Disposition: A | Discharge status: Home / Self Care | Payer: BLUE CROSS/BLUE SHIELD | Source: Ambulatory Visit | Attending: Internal Medicine | Admitting: Internal Medicine

## 2016-08-21 DIAGNOSIS — D8689 Sarcoidosis of other sites: Secondary | ICD-10-CM

## 2016-08-21 MED ORDER — IOPAMIDOL (ISOVUE-300) INJECTION 61%
80.0000 mL | Freq: Once | INTRAVENOUS | Status: AC | PRN
  Administered 2016-08-21: 80 mL via INTRAVENOUS

## 2016-08-28 ENCOUNTER — Ambulatory Visit: Payer: BLUE CROSS/BLUE SHIELD | Admitting: Internal Medicine

## 2016-08-29 ENCOUNTER — Ambulatory Visit (INDEPENDENT_AMBULATORY_CARE_PROVIDER_SITE_OTHER): Payer: BLUE CROSS/BLUE SHIELD | Admitting: Internal Medicine

## 2016-08-29 ENCOUNTER — Encounter: Payer: Self-pay | Admitting: Internal Medicine

## 2016-08-29 VITALS — BP 126/84 | HR 70 | Ht 66.5 in | Wt 194.0 lb

## 2016-08-29 DIAGNOSIS — M5414 Radiculopathy, thoracic region: Secondary | ICD-10-CM

## 2016-08-29 DIAGNOSIS — R590 Localized enlarged lymph nodes: Secondary | ICD-10-CM

## 2016-08-29 DIAGNOSIS — D8689 Sarcoidosis of other sites: Secondary | ICD-10-CM | POA: Diagnosis not present

## 2016-08-29 NOTE — Progress Notes (Signed)
Subjective:     Patient ID: Gregory Fuller, male   DOB: 06/28/1981, 35 y.o.   MRN: [id number]  PCP Nilda Simmer, MD   HPI   OV 08/29/2016  Chief Complaint  Patient presents with  . Follow-up    Pt here after CT chest. Pt denies change in SOB. Pt c/o prod cough - worse at night and occassional epigastric pain. Pt denies f/c/s.    Follow-up neurosarcoidosis associated mediastinal adenopathy. Last seen by [facility] rheumatology in September 2017. Notes reviewed. He continues to be on Rituxan. He was tapered off prednisone in April 2016. He has lost and no pulmonary symptoms. The only symptom is occasional mild thoracic radiculopathy especially at night. But even this is improved. No dyspnea. He's no longer exercising and is mostly sedentary. He is up-to-date with his flu shot. Here to review CT chest October 2017 for his medicine adenopathy and it shows only mildly enlarged right hilar lymph node similar to prior exams. Pulmonary parenchyma is fine. He denies any cough or shortness of breath   IMPRESSION: 08/22/16 compared to oct 2015 1. Persistent borderline and mildly enlarged right hilar lymph nodes, similar to prior examinations. This is presumably related to underlying sarcoidosis in this individual. 2. No acute findings in the thorax to account for the patient's symptoms.   Electronically Signed   By: Vinnie Langton M.D.   On: 08/22/2016 08:54    has a past medical history of Asthma; Bronchitis; GERD (gastroesophageal reflux disease); Sarcoidosis ([place]); and Subacute transverse myelitis ([place]).   reports that he has never smoked. He has never used smokeless tobacco.  Past Surgical History:  Procedure Laterality Date  . LYMPH NODE BIOPSY N/A 10/13/2014   Procedure: LYMPH NODE BIOPSY;  Surgeon: Melrose Nakayama, MD;  Location: [facility];  Service: Thoracic;  Laterality: N/A;  . MEDIASTINOSCOPY N/A 10/13/2014   Procedure: MEDIASTINOSCOPY;  Surgeon: Melrose Nakayama, MD;  Location:  [place];  Service: Thoracic;  Laterality: N/A;  . NO PAST SURGERIES    . VIDEO BRONCHOSCOPY WITH ENDOBRONCHIAL ULTRASOUND N/A 09/08/2014   Procedure: VIDEO BRONCHOSCOPY WITH ENDOBRONCHIAL ULTRASOUND;  Surgeon: Brand Males, MD;  [facility];  Service: Thoracic;  Laterality: N/A;    No Known Allergies  Immunization History  Administered Date(s) Administered  . Influenza Split 08/06/2015  . Influenza,inj,Quad PF,36+ Mos 08/09/2014, 07/06/2016  . Pneumococcal-Unspecified 03/07/2015    Family History  Problem Relation Age of Onset  . Diabetes Maternal Grandmother   . Thyroid disease Mother   . Seizures Maternal Grandfather   . Seizures Father      Current Outpatient Prescriptions:  .  albuterol (PROVENTIL HFA) 108 (90 Base) MCG/ACT inhaler, INHALE 2 PUFFS EVERY 6 HOURS AS NEEDED FOR WHEEZING OR SHORTNESS OF BREATH, Disp: 1 each, Rfl: 5 .  docusate sodium (COLACE) 100 MG capsule, Take 100 mg by mouth daily as needed for mild constipation. Reported on 04/10/2016, Disp: , Rfl:  .  gabapentin (NEURONTIN) 800 MG tablet, Take 800 mg by mouth 3 (three) times daily., Disp: , Rfl:  .  ibuprofen (ADVIL,MOTRIN) 200 MG tablet, Take 200 mg by mouth every 6 (six) hours as needed for moderate pain. , Disp: , Rfl:  .  pantoprazole (PROTONIX) 40 MG tablet, Take 1 tablet (40 mg total) by mouth daily., Disp: 30 tablet, Rfl: 11 .  RiTUXimab (RITUXAN IV), Inject 1 each into the vein every 6 (six) months. , Disp: , Rfl:    Review of Systems  Objective:   Physical Exam  Constitutional: He is oriented to person, place, and time. He appears well-developed and well-nourished. No distress.  HENT:  Head: Normocephalic and atraumatic.  Right Ear: External ear normal.  Left Ear: External ear normal.  Mouth/Throat: Oropharynx is clear and moist. No oropharyngeal exudate.  Eyes: Conjunctivae and EOM are normal. Pupils are equal, round, and reactive to light. Right eye exhibits no discharge. Left eye  exhibits no discharge. No scleral icterus.  Neck: Normal range of motion. Neck supple. No JVD present. No tracheal deviation present. No thyromegaly present.  Cardiovascular: Normal rate, regular rhythm and intact distal pulses.  Exam reveals no gallop and no friction rub.   No murmur heard. Pulmonary/Chest: Effort normal and breath sounds normal. No respiratory distress. He has no wheezes. He has no rales. He exhibits no tenderness.  Abdominal: Soft. Bowel sounds are normal. He exhibits no distension and no mass. There is no tenderness. There is no rebound and no guarding.  Musculoskeletal: Normal range of motion. He exhibits no edema or tenderness.  Lymphadenopathy:    He has no cervical adenopathy.  Neurological: He is alert and oriented to person, place, and time. He has normal reflexes. No cranial nerve deficit. Coordination normal.  Skin: Skin is warm and dry. No rash noted. He is not diaphoretic. No erythema. No pallor.  Psychiatric: He has a normal mood and affect. His behavior is normal. Judgment and thought content normal.  Nursing note and vitals reviewed.   Vitals:   08/29/16 0934  BP: 126/84  Pulse: 70  SpO2: 99%  Weight: 194 lb (88 kg)  Height: 5' 6.5" (1.689 m)   BMI    Body Mass Index:  30.84 kg/m          Assessment:       ICD-9-CM ICD-10-CM   1. Mediastinal lymphadenopathy 785.6 R59.0   2. Neurosarcoidosis in adult [facility]) 135 D86.89   3. Thoracic radiculopathy 724.4 M54.14        Plan:      MRI at [facility] March 2017 shows remission in spine CT chest oct 2017 stable with mild lymph node enlargement only since 2 years ago Noted still some ongoing thopracic radiculopathy  Plan Avoid running and impact to spine Do swimming or Tai-chi Consider upepr back and neck strengthening exercises with light load or through PT   Followup 1 year or sooner if needed   Dr. Brand Males, M.D., [facility].C.P Pulmonary and Critical Care Medicine Staff Physician [place] [facility] Pager: [phone], If no answer or between  15:00h - 7:00h: call [phone]  08/29/2016 10:01 AM

## 2016-08-29 NOTE — Patient Instructions (Signed)
ICD-9-CM ICD-10-CM   1. Mediastinal lymphadenopathy 785.6 R59.0   2. Neurosarcoidosis in adult [facility]) 135 D86.89   3. Thoracic radiculopathy 724.4 M54.14      MRI [facility] March 2017 shows remission in spine CT chest oct 2017 stable with mild lymph node enlargement only since 2 years ago Noted still some ongoing thopracic radiculopathy  Plan Avoid running and impact to spine Do swimming or Tai-chi Consider upepr back and neck strengthening exercises with light load or through PT   Followup 1 year or sooner if needed

## 2016-09-07 NOTE — Progress Notes (Signed)
Pt seen in office and results were relayed to pt. Nothing further needed at this time.

## 2016-09-26 ENCOUNTER — Other Ambulatory Visit: Payer: Self-pay | Admitting: Internal Medicine

## 2017-07-16 ENCOUNTER — Encounter: Payer: Self-pay | Admitting: Internal Medicine

## 2017-07-16 ENCOUNTER — Ambulatory Visit (INDEPENDENT_AMBULATORY_CARE_PROVIDER_SITE_OTHER): Payer: BLUE CROSS/BLUE SHIELD | Admitting: Internal Medicine

## 2017-07-16 VITALS — BP 126/82 | HR 72 | Ht 66.5 in | Wt 180.0 lb

## 2017-07-16 DIAGNOSIS — Z23 Encounter for immunization: Secondary | ICD-10-CM | POA: Diagnosis not present

## 2017-07-16 DIAGNOSIS — D8689 Sarcoidosis of other sites: Secondary | ICD-10-CM | POA: Diagnosis not present

## 2017-07-16 NOTE — Patient Instructions (Signed)
ICD-10-CM   1. Neurosarcoidosis in adult D86.89    Glad you are doing well and shortness of breath is minimal FLu shot 07/16/2017  followup as needed

## 2017-07-16 NOTE — Progress Notes (Signed)
Subjective:     Patient ID: Gregory Fuller, male   DOB: 11/09/1980, 36 y.o.   MRN: [id number]  HPI   HPI   OV 08/29/2016  Chief Complaint  Patient presents with  . Follow-up    Pt here after CT chest. Pt denies change in SOB. Pt c/o prod cough - worse at night and occassional epigastric pain. Pt denies f/c/s.    Follow-up neurosarcoidosis associated mediastinal adenopathy. Last seen by [facility] rheumatology in September 2017. Notes reviewed. He continues to be on Rituxan. He was tapered off prednisone in April 2016. He has lost and no pulmonary symptoms. The only symptom is occasional mild thoracic radiculopathy especially at night. But even this is improved. No dyspnea. He's no longer exercising and is mostly sedentary. He is up-to-date with his flu shot. Here to review CT chest October 2017 for his medicine adenopathy and it shows only mildly enlarged right hilar lymph node similar to prior exams. Pulmonary parenchyma is fine. He denies any cough or shortness of breath   IMPRESSION: 08/22/16 compared to oct 2015 1. Persistent borderline and mildly enlarged right hilar lymph nodes, similar to prior examinations. This is presumably related to underlying sarcoidosis in this individual. 2. No acute findings in the thorax to account for the patient's symptoms.   Electronically Signed   By: Vinnie Langton M.D.   On: 08/22/2016 08:54    has a past medical history of Asthma  OV 07/16/2017  Chief Complaint  Patient presents with  . Follow-up    Pt states overall he is doing well. Pt states he sees a new neurologist at [facility].    Follow-up neurosarcoidosis with mediastinal adenopathy  Is a one year follow-up. He has some associated mild shortness of breath is significantly improved. He also thoracic radiculopathy with results as significantly improved. He still has the symptoms but they're very mild. His follow-up is at [facility]. He gets Rituxan every 6 months last dose August  2018 he did apparently MRI that showed significant improvement. He will have flu shot today although he wondered if he can have a couple months or a month after Rituxan. He is open to having as needed follow-up     has a past medical history of Asthma; Bronchitis; GERD (gastroesophageal reflux disease); Sarcoidosis; and Subacute transverse myelitis ([place]).   reports that he has never smoked. He has never used smokeless tobacco.  Past Surgical History:  Procedure Laterality Date  . LYMPH NODE BIOPSY N/A 10/13/2014   Procedure: LYMPH NODE BIOPSY;  Surgeon: Melrose Nakayama, MD;  Location: [place];  Service: Thoracic;  Laterality: N/A;  . MEDIASTINOSCOPY N/A 10/13/2014   Procedure: MEDIASTINOSCOPY;  Surgeon: Melrose Nakayama, MD;  Location: [place];  Service: Thoracic;  Laterality: N/A;  . NO PAST SURGERIES    . VIDEO BRONCHOSCOPY WITH ENDOBRONCHIAL ULTRASOUND N/A 09/08/2014   Procedure: VIDEO BRONCHOSCOPY WITH ENDOBRONCHIAL ULTRASOUND;  Surgeon: Brand Males, MD;  [facility];  Service: Thoracic;  Laterality: N/A;    No Known Allergies  Immunization History  Administered Date(s) Administered  . Influenza Split 08/06/2015  . Influenza,inj,Quad PF,6+ Mos 08/09/2014, 07/06/2016  . Influenza-Unspecified 08/09/2014  . Pneumococcal Conjugate-13 11/12/2014  . Pneumococcal Polysaccharide-23 03/07/2015  . Pneumococcal-Unspecified 03/07/2015  . Tdap 11/15/2016    Family History  Problem Relation Age of Onset  . Diabetes Maternal Grandmother   . Thyroid disease Mother   . Seizures Maternal Grandfather   . Seizures Father  Current Outpatient Prescriptions:  .  albuterol (PROVENTIL HFA) 108 (90 Base) MCG/ACT inhaler, INHALE 2 PUFFS EVERY 6 HOURS AS NEEDED FOR WHEEZING OR SHORTNESS OF BREATH, Disp: 1 each, Rfl: 5 .  carbamazepine (CARBATROL) 100 MG 12 hr capsule, Take 100 mg by mouth 2 (two) times daily., Disp: , Rfl:  .  Cholecalciferol (VITAMIN D3) 3000 units TABS, Take  4,000 Units by mouth daily., Disp: , Rfl:  .  docusate sodium (COLACE) 100 MG capsule, Take 100 mg by mouth daily as needed for mild constipation. Reported on 04/10/2016, Disp: , Rfl:  .  ibuprofen (ADVIL,MOTRIN) 200 MG tablet, Take 200 mg by mouth every 6 (six) hours as needed for moderate pain. , Disp: , Rfl:  .  pantoprazole (PROTONIX) 40 MG tablet, TAKE 1 TABLET EVERY DAY, Disp: 30 tablet, Rfl: 11 .  RiTUXimab (RITUXAN IV), Inject 1 each into the vein every 6 (six) months. , Disp: , Rfl:     Review of Systems     Objective:   Physical Exam  Constitutional: He is oriented to person, place, and time. He appears well-developed and well-nourished. No distress.  HENT:  Head: Normocephalic and atraumatic.  Right Ear: External ear normal.  Left Ear: External ear normal.  Mouth/Throat: Oropharynx is clear and moist. No oropharyngeal exudate.  Eyes: Pupils are equal, round, and reactive to light. Conjunctivae and EOM are normal. Right eye exhibits no discharge. Left eye exhibits no discharge. No scleral icterus.  Neck: Normal range of motion. Neck supple. No JVD present. No tracheal deviation present. No thyromegaly present.  Cardiovascular: Normal rate, regular rhythm and intact distal pulses.  Exam reveals no gallop and no friction rub.   No murmur heard. Pulmonary/Chest: Effort normal and breath sounds normal. No respiratory distress. He has no wheezes. He has no rales. He exhibits no tenderness.  Abdominal: Soft. Bowel sounds are normal. He exhibits no distension and no mass. There is no tenderness. There is no rebound and no guarding.  Musculoskeletal: Normal range of motion. He exhibits no edema or tenderness.  Lymphadenopathy:    He has no cervical adenopathy.  Neurological: He is alert and oriented to person, place, and time. He has normal reflexes. No cranial nerve deficit. Coordination normal.  Skin: Skin is warm and dry. No rash noted. He is not diaphoretic. No erythema. No pallor.   Psychiatric: He has a normal mood and affect. His behavior is normal. Judgment and thought content normal.  Nursing note and vitals reviewed.  Vitals:   07/16/17 1638  BP: 126/82  Pulse: 72  SpO2: 98%  Weight: 180 lb (81.6 kg)  Height: 5' 6.5" (1.689 m)       Assessment:       ICD-10-CM   1. Neurosarcoidosis in adult D86.89   2. Need for immunization against influenza Z23 Flu Vaccine QUAD 36+ mos IM       Plan:      Glad you are doing well and shortness of breath is minimal FLu shot 07/16/2017  followup as needed   Dr. Brand Males, M.D., [facility].C.P Pulmonary and Critical Care Medicine Staff Physician [place] [facility] Pager: [phone], If no answer or between  15:00h - 7:00h: call [phone]  07/16/2017 5:05 PM

## 2017-08-26 ENCOUNTER — Other Ambulatory Visit: Payer: Self-pay | Admitting: Internal Medicine

## 2017-11-25 ENCOUNTER — Other Ambulatory Visit: Payer: Self-pay | Admitting: *Deleted

## 2017-11-25 MED ORDER — PANTOPRAZOLE SODIUM 40 MG PO TBEC: 40.0000 mg | DELAYED_RELEASE_TABLET | Freq: Every day | ORAL | 1 refills | Status: DC

## 2018-02-02 ENCOUNTER — Other Ambulatory Visit: Payer: Self-pay | Admitting: Internal Medicine

## 2018-02-04 ENCOUNTER — Encounter: Payer: Self-pay | Admitting: Internal Medicine

## 2018-02-04 ENCOUNTER — Other Ambulatory Visit: Payer: Self-pay

## 2018-02-04 MED ORDER — ALBUTEROL SULFATE HFA 108 (90 BASE) MCG/ACT IN AERS: INHALATION_SPRAY | RESPIRATORY_TRACT | 2 refills | Status: DC

## 2018-04-04 ENCOUNTER — Encounter: Payer: Self-pay | Admitting: Internal Medicine

## 2018-04-04 NOTE — Telephone Encounter (Signed)
Hi Mr Fre[place],     Please call the office to schedule an appointment at [phone].  Thank you,  [facility]   ===View-only below this line===   ----- Message -----    From: [name]    Sent: 04/04/2018 12:09 AM EDT      To: Brand Males, MD Subject: Non-Urgent Medical Question  Hi Dr. Chase Caller,  I hope all is well with you. I want to make an appointment with you to get checked out. I want to do my yearly testing and x-rays. To make sure my lungs and heart are doing good, and have no signs of sarcoidosis.  Thanks so much  NIKE

## 2018-04-17 ENCOUNTER — Ambulatory Visit (INDEPENDENT_AMBULATORY_CARE_PROVIDER_SITE_OTHER): Payer: Managed Care, Other (non HMO) | Admitting: Internal Medicine

## 2018-04-17 ENCOUNTER — Encounter: Payer: Self-pay | Admitting: Internal Medicine

## 2018-04-17 VITALS — BP 120/76 | HR 47 | Ht 66.5 in | Wt 180.8 lb

## 2018-04-17 DIAGNOSIS — R0609 Other forms of dyspnea: Secondary | ICD-10-CM | POA: Diagnosis not present

## 2018-04-17 DIAGNOSIS — D8689 Sarcoidosis of other sites: Secondary | ICD-10-CM

## 2018-04-17 NOTE — Progress Notes (Signed)
Subjective:     Patient ID: Gregory Fuller, male   DOB: 1981-07-14, 37 y.o.   MRN: [id number]    HPI  HPI   OV 08/29/2016  Chief Complaint  Patient presents with  . Follow-up    Pt here after CT chest. Pt denies change in SOB. Pt c/o prod cough - worse at night and occassional epigastric pain. Pt denies f/c/s.    Follow-up neurosarcoidosis associated mediastinal adenopathy. Last seen by [facility] rheumatology in September 2017. Notes reviewed. He continues to be on Rituxan. He was tapered off prednisone in April 2016. He has lost and no pulmonary symptoms. The only symptom is occasional mild thoracic radiculopathy especially at night. But even this is improved. No dyspnea. He's no longer exercising and is mostly sedentary. He is up-to-date with his flu shot. Here to review CT chest October 2017 for his medicine adenopathy and it shows only mildly enlarged right hilar lymph node similar to prior exams. Pulmonary parenchyma is fine. He denies any cough or shortness of breath   IMPRESSION: 08/22/16 compared to oct 2015 1. Persistent borderline and mildly enlarged right hilar lymph nodes, similar to prior examinations. This is presumably related to underlying sarcoidosis in this individual. 2. No acute findings in the thorax to account for the patient's symptoms.   Electronically Signed   By: Vinnie Langton M.D.   On: 08/22/2016 08:54    has a past medical history of Asthma  OV 07/16/2017  Chief Complaint  Patient presents with  . Follow-up    Pt states overall he is doing well. Pt states he sees a new neurologist at [facility].    Follow-up neurosarcoidosis with mediastinal adenopathy  Is a one year follow-up. He has some associated mild shortness of breath is significantly improved. He also thoracic radiculopathy with results as significantly improved. He still has the symptoms but they're very mild. His follow-up is at [facility]. He gets Rituxan every 6 months last dose  August 2018 he did apparently MRI that showed significant improvement. He will have flu shot today although he wondered if he can have a couple months or a month after Rituxan. He is open to having as needed follow-up   OV 04/17/2018  Chief Complaint  Patient presents with  . Follow-up    Pt states he has been doing well since last visit. States he has concerns to the point when he becomes anxious and has problems catching his breath and also has problems breathing fully through his nose.   Follow-up neurosarcoidosis with mediastinal adenopathy  Last seen September 2018.  He basically comes here for supportive care and observation follow-up.  He follows regularly at [facility].  He gets 64-month Rituxan there.  But most recently because of change in insurance and also because of depressed white counts while he was on Tegretol for neuropathy there was a delay in his Rituxan and most recently got his Rituxan only in May/June 2019.  He works at my eye doctor shop.  Overall he is doing well.  Since I last saw him he has lost weight.  He has become physically fit.  He works out in a gym 2 days a week and a heavy exercise.  He says that he does warm up.  However he does not cooldown.  On some occasions in the setting of heavy exercise he will feel dizzy and feel like things are passing out.  He does not shortness of breath.  He is worried  about this.  Most recent chest x-ray was in fall 2018 at [facility].  I reviewed the results although I do not have the image to visualize.  It is reported as clear.      has a past medical history of Asthma, Bronchitis, GERD (gastroesophageal reflux disease), Sarcoidosis, and Subacute transverse myelitis ([place]).   reports that he has never smoked. He has never used smokeless tobacco.  Past Surgical History:  Procedure Laterality Date  . LYMPH NODE BIOPSY N/A 10/13/2014   Procedure: LYMPH NODE BIOPSY;  Surgeon: Melrose Nakayama, MD;  Location: [place];   Service: Thoracic;  Laterality: N/A;  . MEDIASTINOSCOPY N/A 10/13/2014   Procedure: MEDIASTINOSCOPY;  Surgeon: Melrose Nakayama, MD;  Location: [place];  Service: Thoracic;  Laterality: N/A;  . NO PAST SURGERIES    . VIDEO BRONCHOSCOPY WITH ENDOBRONCHIAL ULTRASOUND N/A 09/08/2014   Procedure: VIDEO BRONCHOSCOPY WITH ENDOBRONCHIAL ULTRASOUND;  Surgeon: Brand Males, MD;  Location[facility];  Service: Thoracic;  Laterality: N/A;    No Known Allergies  Immunization History  Administered Date(s) Administered  . Influenza Split 08/06/2015  . Influenza,inj,Quad PF,6+ Mos 08/09/2014, 07/06/2016, 07/16/2017  . Influenza-Unspecified 08/09/2014  . Pneumococcal Conjugate-13 11/12/2014  . Pneumococcal Polysaccharide-23 03/07/2015  . Pneumococcal-Unspecified 03/07/2015  . Tdap 11/15/2016    Family History  Problem Relation Age of Onset  . Diabetes Maternal Grandmother   . Thyroid disease Mother   . Seizures Maternal Grandfather   . Seizures Father      Current Outpatient Medications:  .  albuterol (PROVENTIL HFA) 108 (90 Base) MCG/ACT inhaler, INHALE 2 PUFFS EVERY 6 HOURS AS NEEDED FOR WHEEZING OR SHORTNESS OF BREATH, Disp: 1 each, Rfl: 2 .  Cholecalciferol (VITAMIN D3) 3000 units TABS, Take 4,000 Units by mouth daily., Disp: , Rfl:  .  docusate sodium (COLACE) 100 MG capsule, Take 100 mg by mouth daily as needed for mild constipation. Reported on 04/10/2016, Disp: , Rfl:  .  ibuprofen (ADVIL,MOTRIN) 200 MG tablet, Take 200 mg by mouth every 6 (six) hours as needed for moderate pain. , Disp: , Rfl:  .  pantoprazole (PROTONIX) 40 MG tablet, Take 1 tablet (40 mg total) by mouth daily., Disp: 90 tablet, Rfl: 1 .  RiTUXimab (RITUXAN IV), Inject 1 each into the vein every 6 (six) months. , Disp: , Rfl:      Review of Systems     Objective:   Physical Exam  Constitutional: He is oriented to person, place, and time. He appears well-developed and well-nourished. No distress.  Looks fitter   HENT:  Head: Normocephalic and atraumatic.  Right Ear: External ear normal.  Left Ear: External ear normal.  Mouth/Throat: Oropharynx is clear and moist. No oropharyngeal exudate.  Eyes: Pupils are equal, round, and reactive to light. Conjunctivae and EOM are normal. Right eye exhibits no discharge. Left eye exhibits no discharge. No scleral icterus.  Neck: Normal range of motion. Neck supple. No JVD present. No tracheal deviation present. No thyromegaly present.  Cardiovascular: Normal rate, regular rhythm and intact distal pulses. Exam reveals no gallop and no friction rub.  No murmur heard. Pulmonary/Chest: Effort normal and breath sounds normal. No respiratory distress. He has no wheezes. He has no rales. He exhibits no tenderness.  Abdominal: Soft. Bowel sounds are normal. He exhibits no distension and no mass. There is no tenderness. There is no rebound and no guarding.  Musculoskeletal: Normal range of motion. He exhibits no edema or tenderness.  Lymphadenopathy:  He has no cervical adenopathy.  Neurological: He is alert and oriented to person, place, and time. He has normal reflexes. No cranial nerve deficit. Coordination normal.  Skin: Skin is warm and dry. No rash noted. He is not diaphoretic. No erythema. No pallor.  Psychiatric: He has a normal mood and affect. His behavior is normal. Judgment and thought content normal.  Nursing note and vitals reviewed.  Vitals:   04/17/18 0945  BP: 120/76  Pulse: (!) 47  SpO2: 100%  Weight: 180 lb 12.8 oz (82 kg)  Height: 5' 6.5" (1.689 m)    Body mass index is 28.74 kg/m.     Assessment:       ICD-10-CM   1. Dyspnea on exertion R06.09   2. Neurosarcoidosis in adult D86.89        Plan:     Dyspnea on exertion  - I think you have physiologic limitation under conditions of heavy exercise - however, to sort things out do CPST bike test with EIB challenge  Neurosarcoidosis in adult  - per [facility] but ask them if you should  be on bactrim 1DS tablet 3 times a week as a measure to prevent opportunistic infection  followup Next few weeks to few months but after completion of CPST bike test  Dr. Brand Males, M.D., [facility].C.P Pulmonary and Critical Care Medicine Staff Physician, [place] Director - Interstitial Lung Disease  Program  Pulmonary [place] [facility] [place], [place], [address]  Pager: [phone], If no answer or between  15:00h - 7:00h: call [phone] Telephone: [phone]

## 2018-04-17 NOTE — Patient Instructions (Signed)
Dyspnea on exertion  - I think you have physiologic limitation under conditions of heavy exercise - however, to sort things out do CPST bike test with EIB challenge  Neurosarcoidosis in adult  - per [facility] but ask them if you should be on bactrim 1DS tablet 3 times a week as a measure to prevent opportunistic infection  followup Next few weeks to few months but after completion of CPST bike test

## 2018-05-12 ENCOUNTER — Ambulatory Visit (HOSPITAL_COMMUNITY): Payer: Managed Care, Other (non HMO) | Attending: Internal Medicine

## 2018-05-12 DIAGNOSIS — R0600 Dyspnea, unspecified: Secondary | ICD-10-CM | POA: Insufficient documentation

## 2018-05-12 DIAGNOSIS — R0609 Other forms of dyspnea: Secondary | ICD-10-CM | POA: Diagnosis not present

## 2018-05-19 ENCOUNTER — Encounter: Payer: Self-pay | Admitting: Internal Medicine

## 2018-05-19 NOTE — Telephone Encounter (Signed)
MR- please advise on CPST results, thanks

## 2018-05-21 NOTE — Telephone Encounter (Signed)
E-mail sent to patient with CPST results No Arnuity samples in office - happy to send in Rx October schedule not out yet, will place recall

## 2018-05-21 NOTE — Telephone Encounter (Signed)
CPST suggests likely exercise induced bronchospasm  Plan Start daily arnuity low dose once daily If expenseive he should call us  Followup 3 months

## 2018-06-04 ENCOUNTER — Encounter: Payer: Self-pay | Admitting: Internal Medicine

## 2018-06-04 NOTE — Telephone Encounter (Signed)
MR please advise. Thanks! 

## 2018-06-04 NOTE — Telephone Encounter (Signed)
Is he on bactrim prophylasxis because of rituxan?  If not, he needs to call his neurologist and get on it If he is on it, he should hold it for 5 days and during these 5 days take - Take doxycycline 100mg  po twice daily x 5 days; take after meals and avoid sunlight

## 2018-06-05 MED ORDER — DOXYCYCLINE HYCLATE 100 MG PO TABS: 100.0000 mg | ORAL_TABLET | Freq: Two times a day (BID) | ORAL | 0 refills | Status: DC

## 2018-06-13 ENCOUNTER — Ambulatory Visit: Payer: Managed Care, Other (non HMO) | Admitting: Internal Medicine

## 2018-07-08 DIAGNOSIS — D8689 Sarcoidosis of other sites: Secondary | ICD-10-CM | POA: Diagnosis not present

## 2018-07-08 DIAGNOSIS — G0491 Myelitis, unspecified: Secondary | ICD-10-CM | POA: Diagnosis not present

## 2018-07-14 ENCOUNTER — Other Ambulatory Visit: Payer: Self-pay | Admitting: *Deleted

## 2018-07-14 MED ORDER — PANTOPRAZOLE SODIUM 40 MG PO TBEC: 40.0000 mg | DELAYED_RELEASE_TABLET | Freq: Every day | ORAL | 1 refills | Status: AC

## 2018-08-11 MED FILL — PANTOPRAZOLE SOD DR 40 MG T: 40 | 90 days supply | Qty: 90 | Fill #0

## 2018-09-02 MED FILL — GABAPENTIN 400 MG CAPS: 400 | 60 days supply | Qty: 360 | Fill #0

## 2018-09-23 ENCOUNTER — Other Ambulatory Visit (HOSPITAL_COMMUNITY): Payer: Self-pay | Admitting: *Deleted

## 2018-09-24 ENCOUNTER — Ambulatory Visit (HOSPITAL_COMMUNITY)
Admission: RE | Admit: 2018-09-24 | Discharge: 2018-09-24 | Disposition: A | Discharge status: Home / Self Care | Payer: 59 | Source: Ambulatory Visit | Attending: Psychiatry | Admitting: Psychiatry

## 2018-09-24 DIAGNOSIS — G0491 Myelitis, unspecified: Secondary | ICD-10-CM | POA: Insufficient documentation

## 2018-09-24 DIAGNOSIS — D8689 Sarcoidosis of other sites: Secondary | ICD-10-CM | POA: Diagnosis not present

## 2018-09-24 LAB — CBC WITH DIFFERENTIAL/PLATELET
Abs Immature Granulocytes: 0.01 10*3/uL (ref 0.00–0.07)
Basophils Absolute: 0 10*3/uL (ref 0.0–0.1)
Basophils Relative: 1 %
Eosinophils Absolute: 0.1 10*3/uL (ref 0.0–0.5)
Eosinophils Relative: 3 %
HCT: 44 % (ref 39.0–52.0)
Hemoglobin: 14.7 g/dL (ref 13.0–17.0)
Immature Granulocytes: 0 %
Lymphocytes Relative: 45 %
Lymphs Abs: 1.6 10*3/uL (ref 0.7–4.0)
MCH: 27 pg (ref 26.0–34.0)
MCHC: 33.4 g/dL (ref 30.0–36.0)
MCV: 80.9 fL (ref 80.0–100.0)
Monocytes Absolute: 0.4 10*3/uL (ref 0.1–1.0)
Monocytes Relative: 10 %
Neutro Abs: 1.5 10*3/uL — ABNORMAL LOW (ref 1.7–7.7)
Neutrophils Relative %: 41 %
Platelets: 209 10*3/uL (ref 150–400)
RBC: 5.44 MIL/uL (ref 4.22–5.81)
RDW: 13.6 % (ref 11.5–15.5)
WBC: 3.6 10*3/uL — ABNORMAL LOW (ref 4.0–10.5)
nRBC: 0 % (ref 0.0–0.2)

## 2018-09-24 LAB — PROTEIN / CREATININE RATIO, URINE: Creatinine, Urine: 36.5 mg/dL

## 2018-09-24 MED ORDER — SODIUM CHLORIDE 0.9 % IV SOLN: INTRAVENOUS | Status: DC

## 2018-09-24 MED ORDER — SODIUM CHLORIDE 0.9 % IV SOLN
1000.0000 mg | Freq: Once | INTRAVENOUS | Status: AC
  Administered 2018-09-24: 1000 mg via INTRAVENOUS
  Filled 2018-09-24 (×2): qty 100

## 2018-09-24 MED ORDER — DIPHENHYDRAMINE HCL 50 MG/ML IJ SOLN
INTRAMUSCULAR | Status: AC
  Administered 2018-09-24: 25 mg via INTRAVENOUS
  Filled 2018-09-24: qty 1

## 2018-09-24 MED ORDER — ACETAMINOPHEN 325 MG PO TABS
ORAL_TABLET | ORAL | Status: AC
  Administered 2018-09-24: 650 mg via ORAL
  Filled 2018-09-24: qty 2

## 2018-09-24 MED ORDER — ACETAMINOPHEN 325 MG PO TABS
650.0000 mg | ORAL_TABLET | Freq: Once | ORAL | Status: AC
  Administered 2018-09-24: 650 mg via ORAL

## 2018-09-24 MED ORDER — DIPHENHYDRAMINE HCL 50 MG/ML IJ SOLN
25.0000 mg | Freq: Once | INTRAMUSCULAR | Status: AC
  Administered 2018-09-24: 25 mg via INTRAVENOUS

## 2018-09-24 MED ORDER — SODIUM CHLORIDE 0.9 % IV SOLN: 1000.0000 mg | Freq: Once | INTRAVENOUS | Status: DC

## 2018-09-24 MED ORDER — METHYLPREDNISOLONE SODIUM SUCC 125 MG IJ SOLR
125.0000 mg | Freq: Once | INTRAMUSCULAR | Status: AC
  Administered 2018-09-24: 125 mg via INTRAVENOUS

## 2018-09-24 MED ORDER — METHYLPREDNISOLONE SODIUM SUCC 125 MG IJ SOLR
INTRAMUSCULAR | Status: AC
  Administered 2018-09-24: 125 mg via INTRAVENOUS
  Filled 2018-09-24: qty 2

## 2018-10-08 DIAGNOSIS — Z6829 Body mass index (BMI) 29.0-29.9, adult: Secondary | ICD-10-CM | POA: Diagnosis not present

## 2018-10-08 DIAGNOSIS — K5909 Other constipation: Secondary | ICD-10-CM | POA: Diagnosis not present

## 2018-10-08 DIAGNOSIS — M792 Neuralgia and neuritis, unspecified: Secondary | ICD-10-CM | POA: Diagnosis not present

## 2018-10-08 DIAGNOSIS — Z7952 Long term (current) use of systemic steroids: Secondary | ICD-10-CM | POA: Diagnosis not present

## 2018-10-08 DIAGNOSIS — G0491 Myelitis, unspecified: Secondary | ICD-10-CM | POA: Diagnosis not present

## 2018-10-08 DIAGNOSIS — D8689 Sarcoidosis of other sites: Secondary | ICD-10-CM | POA: Diagnosis not present

## 2018-10-08 MED FILL — PANTOPRAZOLE SOD DR 20 MG T: 20 | 90 days supply | Qty: 90 | Fill #0

## 2018-10-08 MED FILL — SULFAMETHOXAZOLE-TMP SS TAB: 400-80 | 90 days supply | Qty: 90 | Fill #0

## 2018-10-08 MED FILL — predniSONE 10 MG TABS: 10 | 90 days supply | Qty: 220 | Fill #0

## 2018-10-30 MED FILL — GABAPENTIN 400 MG CAPS: 400 | 90 days supply | Qty: 540 | Fill #0

## 2018-10-31 ENCOUNTER — Ambulatory Visit: Payer: 59 | Admitting: Nurse Practitioner

## 2018-10-31 ENCOUNTER — Encounter: Payer: Self-pay | Admitting: Nurse Practitioner

## 2018-10-31 VITALS — BP 104/66 | HR 60 | Ht 64.5 in | Wt 191.5 lb

## 2018-10-31 DIAGNOSIS — K5900 Constipation, unspecified: Secondary | ICD-10-CM | POA: Diagnosis not present

## 2018-10-31 NOTE — Progress Notes (Signed)
ASSESSMENT / PLAN:   40.  37 year old male with chronic constipation (since 2016 when he started Neurontin)   Currently bowels moving well with daily stool softeners and oddly enough after starting prednisone early November.  -Continue drinking at least 8 glasses of water daily -Recommend patient continue stool softeners.  He will be likely be stopping prednisone at some point and if this truly was helping his constipation then I expect he will have a return of symptoms. Should this happen then I have recommended he start daily Benefiber.  After a couple of weeks if no improvement then add daily MiraLAX.  He should continue this regimen for a month or two and if no improvement call the office for further recommendations which will likely be Linzess or Amitiza  2. Myelitis / probable neurosarcoidosis. Followed by [facility]. Currently on steroids.    HPI:    Chief Complaint:   Constipation   He had a colonoscopy by Dr. Ardis Hughs for evaluation of constipation late September 2016.  The exam was complete, bowel prep was excellent.  A 3 mm ascending colon polyp was removed, exam otherwise normal.  MiraLAX recommended for constipation.  There was some problems with the processing of the biopsy.  So patient was advised to have follow-up colonoscopy in 5 years in case the small polyp was adenomatous.  Patient has been referred back by Neurologist Dr. Delanna Ahmadi, MD.  for constipation.  He tried the MiraLAX for about a week without noticeable improvement.  Constipation is never really gotten better he has just dealt with it.  Patient describes constipation as infrequent bowel movements.  He averages 1-2 bowel movements a day and this is been the case since starting Neurontin in 2016.  Stools are not really hard but he does occasionally have to strain.  Drinks sufficient amounts of fluid.  Over the last couple of weeks his bowels are actually been moving well.  Patient attributes  this improvement to starting stool softeners but also addition of prednisone on the first week of November.   Data Reviewed:  [facility] labs drawn 10/08/2018: Comprehensive metabolic profile normal.  CBC normal.   Past Medical History:  Diagnosis Date  . Asthma   . Bronchitis   . GERD (gastroesophageal reflux disease)   . Sarcoidosis   . Subacute transverse myelitis [facility])     Past Surgical History:  Procedure Laterality Date  . LYMPH NODE BIOPSY N/A 10/13/2014   Procedure: LYMPH NODE BIOPSY;  Surgeon: Melrose Nakayama, MD;  Location: [place];  Service: Thoracic;  Laterality: N/A;  . MEDIASTINOSCOPY N/A 10/13/2014   Procedure: MEDIASTINOSCOPY;  Surgeon: Melrose Nakayama, MD;  Location: [place];  Service: Thoracic;  Laterality: N/A;  . NO PAST SURGERIES    . VIDEO BRONCHOSCOPY WITH ENDOBRONCHIAL ULTRASOUND N/A 09/08/2014   Procedure: VIDEO BRONCHOSCOPY WITH ENDOBRONCHIAL ULTRASOUND;  Surgeon: Brand Males, MD;  Location[facility];  Service: Thoracic;  Laterality: N/A;   Family History  Problem Relation Age of Onset  . Thyroid disease Mother   . Seizures Father   . Diabetes Maternal Grandmother   . Seizures Maternal Grandfather    Social History   Tobacco Use  . Smoking status: Never Smoker  . Smokeless tobacco: Never Used  . Tobacco comment: pt states he smoked socially  Substance Use Topics  . Alcohol use: Yes    Comment: occassional  . Drug use: No  Comment: used to smoke marijuana   Current Outpatient Medications  Medication Sig Dispense Refill  . albuterol (PROVENTIL HFA) 108 (90 Base) MCG/ACT inhaler INHALE 2 PUFFS EVERY 6 HOURS AS NEEDED FOR WHEEZING OR SHORTNESS OF BREATH 1 each 2  . Cholecalciferol (VITAMIN D3) 3000 units TABS Take 4,000 Units by mouth daily.    Marland Kitchen docusate sodium (COLACE) 100 MG capsule Take 100 mg by mouth daily as needed for mild constipation. Reported on 04/10/2016    . gabapentin (NEURONTIN) 400 MG capsule Take 2 capsules by mouth 3 (three)  times daily.    Marland Kitchen ibuprofen (ADVIL,MOTRIN) 200 MG tablet Take 200 mg by mouth as needed for moderate pain.     . pantoprazole (PROTONIX) 40 MG tablet Take 1 tablet (40 mg total) by mouth daily. 90 tablet 1  . predniSONE (DELTASONE) 10 MG tablet Take 3 pills/day (30mg ) for one month,then 20 mg/day for one month, then 15 mg/day until follow-up. After breakfast.    . RiTUXimab (RITUXAN IV) Inject 1 each into the vein every 6 (six) months.     . sulfamethoxazole-trimethoprim (BACTRIM,SEPTRA) 400-80 MG tablet Take 1 tablet by mouth daily.     No current facility-administered medications for this visit.    No Known Allergies   Review of Systems: All systems reviewed and negative except where noted in HPI.   Creatinine clearance cannot be calculated (Patient's most recent lab result is older than the maximum 21 days allowed.)   Physical Exam:    Wt Readings from Last 3 Encounters:  10/31/18 191 lb 8 oz (86.9 kg)  09/24/18 185 lb 2 oz (84 kg)  04/17/18 180 lb 12.8 oz (82 kg)    BP 104/66 (BP Location: Left Arm, Patient Position: Sitting, Cuff Size: Normal)   Pulse 60   Ht 5' 4.5" (1.638 m) Comment: height measured without shoes  Wt 191 lb 8 oz (86.9 kg)   BMI 32.36 kg/m  Constitutional:  Pleasant male in no acute distress. Psychiatric: Normal mood and affect. Behavior is normal. EENT: Pupils normal.  Conjunctivae are normal. No scleral icterus. Neck supple.  Cardiovascular: Normal rate, regular rhythm. No edema Pulmonary/chest: Effort normal and breath sounds normal. No wheezing, rales or rhonchi. Abdominal: Soft, nondistended, nontender. Bowel sounds active throughout. There are no masses palpable. No hepatomegaly. Neurological: Alert and oriented to person place and time. Skin: Skin is warm and dry. No rashes noted.  Tye Savoy, NP  10/31/2018, 9:48 AM  Cc:  Delanna Ahmadi, MD

## 2018-10-31 NOTE — Patient Instructions (Addendum)
If you are age 36 or older, your body mass index should be between 23-30. Your Body mass index is 32.36 kg/m. If this is out of the aforementioned range listed, please consider follow up with your Primary Care Provider.  If you are age 40 or younger, your body mass index should be between 19-25. Your Body mass index is 32.36 kg/m. If this is out of the aformentioned range listed, please consider follow up with your Primary Care Provider.   For constipation: -Continue to drink at least 8 glasses of water daily. -Continue daily stool softeners -If constipation recurs, start daily Benefiber -If still constipated after several days, resume daily MiraLAX.  -Should constipation persist despite adequate hydration, stool softeners, daily fiber, and daily MiraLAX then please call the office for further recommendations  Thank you for choosing me and [facility].   Tye Savoy, NP

## 2018-11-04 NOTE — Progress Notes (Signed)
I agree with the above note, plan 

## 2019-01-05 MED FILL — SULFAMETHOXAZOLE-TMP SS TAB: 400-80 | 30 days supply | Qty: 30 | Fill #0

## 2019-01-05 MED FILL — predniSONE 10 MG TABS: 10 | 30 days supply | Qty: 45 | Fill #0

## 2019-01-19 MED FILL — GABAPENTIN 400 MG CAPS: 400 | 30 days supply | Qty: 180 | Fill #0

## 2019-01-19 MED FILL — PANTOPRAZOLE SOD DR 20 MG T: 20 | 30 days supply | Qty: 30 | Fill #0

## 2019-01-31 MED FILL — predniSONE 5 MG TABS: 5 | 30 days supply | Qty: 74 | Fill #0

## 2019-01-31 MED FILL — SULFAMETHOXAZOLE-TMP SS TAB: 400-80 | 30 days supply | Qty: 30 | Fill #0

## 2019-02-06 MED FILL — ALPRAZolam 0.25 MG TABS: 0.25 | 15 days supply | Qty: 30 | Fill #0

## 2019-03-02 MED FILL — PANTOPRAZOLE SOD DR 20 MG T: 20 | 30 days supply | Qty: 30 | Fill #1

## 2019-03-02 MED FILL — GABAPENTIN 400 MG CAPS: 400 | 30 days supply | Qty: 180 | Fill #1

## 2019-03-02 MED FILL — SULFAMETHOXAZOLE-TMP SS TAB: 400-80 | 15 days supply | Qty: 15 | Fill #1

## 2019-03-20 MED FILL — predniSONE 20 MG TABS: 20 | 30 days supply | Qty: 30 | Fill #0

## 2019-03-20 MED FILL — SULFAMETHOXAZOLE-TMP SS TAB: 400-80 | 30 days supply | Qty: 30 | Fill #1

## 2019-04-08 ENCOUNTER — Emergency Department (HOSPITAL_COMMUNITY)
Admission: EM | Admit: 2019-04-08 | Discharge: 2019-04-09 | Disposition: A | Discharge status: Home / Self Care | Payer: 59 | Attending: Emergency Medicine | Admitting: Emergency Medicine

## 2019-04-08 ENCOUNTER — Other Ambulatory Visit: Payer: Self-pay

## 2019-04-08 ENCOUNTER — Encounter (HOSPITAL_COMMUNITY): Payer: Self-pay | Admitting: Emergency Medicine

## 2019-04-08 ENCOUNTER — Emergency Department (HOSPITAL_COMMUNITY): Payer: 59

## 2019-04-08 DIAGNOSIS — R0789 Other chest pain: Secondary | ICD-10-CM | POA: Diagnosis present

## 2019-04-08 DIAGNOSIS — J45909 Unspecified asthma, uncomplicated: Secondary | ICD-10-CM | POA: Diagnosis not present

## 2019-04-08 DIAGNOSIS — N179 Acute kidney failure, unspecified: Secondary | ICD-10-CM | POA: Insufficient documentation

## 2019-04-08 DIAGNOSIS — R079 Chest pain, unspecified: Secondary | ICD-10-CM

## 2019-04-08 DIAGNOSIS — R202 Paresthesia of skin: Secondary | ICD-10-CM | POA: Diagnosis not present

## 2019-04-08 LAB — CBC
HCT: 43.9 % (ref 39.0–52.0)
Hemoglobin: 15.1 g/dL (ref 13.0–17.0)
MCH: 28.4 pg (ref 26.0–34.0)
MCHC: 34.4 g/dL (ref 30.0–36.0)
MCV: 82.5 fL (ref 80.0–100.0)
Platelets: 225 10*3/uL (ref 150–400)
RBC: 5.32 MIL/uL (ref 4.22–5.81)
RDW: 13.8 % (ref 11.5–15.5)
WBC: 6.7 10*3/uL (ref 4.0–10.5)
nRBC: 0 % (ref 0.0–0.2)

## 2019-04-08 LAB — BASIC METABOLIC PANEL
Anion gap: 11 (ref 5–15)
BUN: 12 mg/dL (ref 6–20)
CO2: 23 mmol/L (ref 22–32)
Calcium: 9.7 mg/dL (ref 8.9–10.3)
Chloride: 105 mmol/L (ref 98–111)
Creatinine, Ser: 1.25 mg/dL — ABNORMAL HIGH (ref 0.61–1.24)
GFR calc Af Amer: >60 mL/min (ref >60)
GFR calc non Af Amer: >60 mL/min (ref >60)
Glucose, Bld: 91 mg/dL (ref 70–99)
Potassium: 4.2 mmol/L (ref 3.5–5.1)
Sodium: 139 mmol/L (ref 135–145)

## 2019-04-08 LAB — TROPONIN I: Troponin I: <0.03 ng/mL (ref <0.03)

## 2019-04-08 MED ORDER — SODIUM CHLORIDE 0.9% FLUSH: 3.0000 mL | Freq: Once | INTRAVENOUS | Status: DC

## 2019-04-08 NOTE — ED Notes (Addendum)
Gregory Fuller [phone] husband for updates

## 2019-04-08 NOTE — ED Triage Notes (Signed)
Pt c/o left sided chest pain and left arm numbness that started today while working. Denies shortness of breath.

## 2019-04-09 ENCOUNTER — Other Ambulatory Visit: Payer: Self-pay

## 2019-04-09 NOTE — ED Provider Notes (Signed)
Emergency Department Provider Note   I have reviewed the triage vital signs and the nursing notes.   HISTORY  Chief Complaint Chest Pain   HPI Gregory Fuller is a 38 y.o. male with a history of sarcoidosis, myelitis and subacute transverse myelitis on rituximab and prednisone who presents the emergency department today with an episode of chest pain and left arm paresthesia.  Patient states that he has had the chest pain in the past will have a tightness that goes from his front to his back at 6 relatively short in nature.  Happened again today was not associated with any shortness of breath, lightheadedness, nausea, vomiting, dizziness or diaphoresis.  Shortly after he did have a paresthesia in his left arm mostly affecting his third fourth and fifth digits this was associated with some lightheadedness.  All the symptoms have resolved.  He has been ointment for 6 hours prior to my evaluation has not had any symptoms during that time.  He had no recent illnesses.  He did recently start back on his prednisone which she has had multiple side effects from in the past.  No rashes.  No history of blood clots.  No lower extremity swelling or pain.   No other associated or modifying symptoms.    Past Medical History:  Diagnosis Date  . Asthma   . Bronchitis   . GERD (gastroesophageal reflux disease)   . Sarcoidosis   . Subacute transverse myelitis [facility])     Patient Active Problem List   Diagnosis Date Noted  . Thoracic radiculopathy 04/10/2016  . Neck pain, musculoskeletal 05/01/2015  . Neurosarcoidosis in adult 11/19/2014  . Hilar adenopathy 08/20/2014  . Dyspnea 08/20/2014  . Asthma, chronic 08/05/2014  . Solitary pulmonary nodule 08/05/2014  . Pleural nodule 08/05/2014  . Mediastinal lymphadenopathy 08/05/2014  . Transverse myelitis ([place]) 08/04/2014  . Myelitis ([place]) 08/04/2014    Past Surgical History:  Procedure Laterality Date  . LYMPH NODE BIOPSY N/A 10/13/2014   Procedure: LYMPH NODE BIOPSY;  Surgeon: Melrose Nakayama, MD;  Location: [place];  Service: Thoracic;  Laterality: N/A;  . MEDIASTINOSCOPY N/A 10/13/2014   Procedure: MEDIASTINOSCOPY;  Surgeon: Melrose Nakayama, MD;  Location: [place];  Service: Thoracic;  Laterality: N/A;  . NO PAST SURGERIES    . VIDEO BRONCHOSCOPY WITH ENDOBRONCHIAL ULTRASOUND N/A 09/08/2014   Procedure: VIDEO BRONCHOSCOPY WITH ENDOBRONCHIAL ULTRASOUND;  Surgeon: Brand Males, MD;  [facility];  Service: Thoracic;  Laterality: N/A;    Current Outpatient Rx  . Order #: [id number] Class: Normal  . Order #: [id number] Class: Historical Med  . Order #: [id number] Class: Historical Med  . Order #: [id number] Class: Historical Med  . Order #: [id number] Class: Historical Med  . Order #: [id number] Class: Normal  . Order #: [id number] Class: Historical Med  . Order #: [id number] Class: Historical Med  . Order #: [id number] Class: Historical Med    Allergies Patient has no known allergies.  Family History  Problem Relation Age of Onset  . Thyroid disease Mother   . Seizures Father   . Diabetes Maternal Grandmother   . Seizures Maternal Grandfather     Social History Social History   Tobacco Use  . Smoking status: Never Smoker  . Smokeless tobacco: Never Used  . Tobacco comment: pt states he smoked socially  Substance Use Topics  . Alcohol use: Yes    Comment: occassional  . Drug use: No    Comment: used to smoke marijuana    Review of Systems  All other systems negative except as documented in the HPI. All pertinent positives and negatives as reviewed in the HPI. ____________________________________________   PHYSICAL EXAM:  VITAL SIGNS: ED Triage Vitals [04/08/19 1908]  Enc Vitals Group     BP 116/64     Pulse Rate 76     Resp 16     Temp 98.5 F (36.9 C)     Temp Source Oral     SpO2 100 %     Weight 185 lb (83.9 kg)     Height 5\' 6"  (1.676 m)     Head Circumference      Peak Flow      Pain  Score 4     Pain Loc      Pain Edu?      Excl. in [place]?     Constitutional: Alert and oriented. Well appearing and in no acute distress. Eyes: Conjunctivae are normal. PERRL. EOMI. Head: Atraumatic. Nose: No congestion/rhinnorhea. Mouth/Throat: Mucous membranes are moist.  Oropharynx non-erythematous. Neck: No stridor.  No meningeal signs.   Cardiovascular: Normal rate, regular rhythm. Good peripheral circulation. Grossly normal heart sounds.   Respiratory: Normal respiratory effort.  No retractions. Lungs CTAB. Gastrointestinal: Soft and nontender. No distention.  Musculoskeletal: No lower extremity tenderness nor edema. No gross deformities of extremities. Neurologic:  No altered mental status, able to give full seemingly accurate history.  Face is symmetric, EOM's intact, pupils equal and reactive, vision intact, tongue and uvula midline without deviation. Upper and Lower extremity motor 5/5, intact pain perception in distal extremities, 2+ reflexes in biceps, patella and achilles tendons. Able to perform finger to nose normal with both hands. Walks without assistance or evident ataxia.   Skin:  Skin is warm, dry and intact. No rash noted.   ____________________________________________   LABS (all labs ordered are listed, but only abnormal results are displayed)  Labs Reviewed  BASIC METABOLIC PANEL - Abnormal; Notable for the following components:      Result Value   Creatinine, Ser 1.25 (*)    All other components within normal limits  CBC  TROPONIN I   ____________________________________________  EKG   EKG Interpretation  Date/Time:  Wednesday April 08 2019 19:09:55 EDT Ventricular Rate:  74 PR Interval:  146 QRS Duration: 82 QT Interval:  362 QTC Calculation: 401 R Axis:   86 Text Interpretation:  Normal sinus rhythm Normal ECG No significant change since last tracing Confirmed by Merrily Pew [id number]) on 04/09/2019 12:44:05 AM        ____________________________________________  RADIOLOGY  Dg Chest 2 View  Result Date: 04/08/2019 CLINICAL DATA:  Initial evaluation for acute chest pain. EXAM: CHEST - 2 VIEW COMPARISON:  Prior radiograph from 12/07/2015 FINDINGS: The cardiac and mediastinal silhouettes are stable in size and contour, and remain within normal limits. The lungs are normally inflated. No airspace consolidation, pleural effusion, or pulmonary edema is identified. There is no pneumothorax. No acute osseous abnormality identified. IMPRESSION: No active cardiopulmonary disease. Electronically Signed   By: Jeannine Boga M.D.   On: 04/08/2019 20:27    ____________________________________________   INITIAL IMPRESSION / ASSESSMENT AND PLAN / ED COURSE  Suspect patient paresthesias are from the myelitis that he is known to have.  We will follow-up with his primary doctors and specialists regarding that.  I discussed doing a head CT to make sure that his neurosarcoidosis had not worsened or any complications but we did not feel that was necessary at this point as he is  asymptomatic at this time making it unlikely.  Chest pain is very atypical for cardiac causes.  Unlikely to be pulmonary embolus without shortness of breath, tachycardia, hypoxia, persistent pain or history of DVTs.  No recent surgeries or long car trips.  No evidence of infectious causes on history, physical or imaging here.  No pneumothorax.  No rash to suggest that as a cause.  This could be related to his sarcoidosis versus musculoskeletal as he does have little of a acute kidney injury.  Could be related to prednisone. Follow-up with PCP to recheck labs and follow-up for this visit if he has return symptoms or worsening symptoms will return here.  Pertinent labs & imaging results that were available during my care of the patient were reviewed by me and considered in my medical decision making (see chart for details).  A medical screening exam was  performed and I feel the patient has had an appropriate workup for their chief complaint at this time and likelihood of emergent condition existing is low. They have been counseled on decision, discharge, follow up and which symptoms necessitate immediate return to the emergency department. They or their family verbally stated understanding and agreement with plan and discharged in stable condition.   ____________________________________________  FINAL CLINICAL IMPRESSION(S) / ED DIAGNOSES  Final diagnoses:  Paresthesia of arm  Chest pain, unspecified type  AKI (acute kidney injury) ([place])     MEDICATIONS GIVEN DURING THIS VISIT:  Medications  sodium chloride flush (NS) 0.9 % injection 3 mL (has no administration in time range)     NEW OUTPATIENT MEDICATIONS STARTED DURING THIS VISIT:  New Prescriptions   No medications on file    Note:  This note was prepared with assistance of Dragon voice recognition software. Occasional wrong-word or sound-a-like substitutions may have occurred due to the inherent limitations of voice recognition software.   Vetta Couzens, Corene Cornea, MD 04/09/19 [phone]

## 2019-04-09 NOTE — ED Notes (Addendum)
Orthostatic Vital Signs  Lying BP - 143/83  HR- 57 Sitting BP - 133/86 HR -61 Standing BP - 139/96HR - 64

## 2019-04-13 MED FILL — PANTOPRAZOLE SOD DR 20 MG T: 20 | 30 days supply | Qty: 30 | Fill #2

## 2019-04-13 MED FILL — predniSONE 20 MG TABS: 20 | 30 days supply | Qty: 30 | Fill #1

## 2019-04-13 MED FILL — GABAPENTIN 400 MG CAPSULE: 400 | 30 days supply | Qty: 180 | Fill #2

## 2019-04-13 MED FILL — SULFAMETHOXAZOLE-TMP SS TAB: 400-80 | 30 days supply | Qty: 30 | Fill #0

## 2019-04-21 ENCOUNTER — Encounter: Payer: Self-pay | Admitting: Internal Medicine

## 2019-04-21 ENCOUNTER — Ambulatory Visit (INDEPENDENT_AMBULATORY_CARE_PROVIDER_SITE_OTHER): Payer: 59 | Admitting: Internal Medicine

## 2019-04-21 ENCOUNTER — Other Ambulatory Visit: Payer: Self-pay

## 2019-04-21 VITALS — BP 148/76 | HR 66 | Temp 98.6°F | Ht 66.0 in | Wt 184.6 lb

## 2019-04-21 DIAGNOSIS — R0609 Other forms of dyspnea: Secondary | ICD-10-CM

## 2019-04-21 DIAGNOSIS — J45990 Exercise induced bronchospasm: Secondary | ICD-10-CM

## 2019-04-21 DIAGNOSIS — R590 Localized enlarged lymph nodes: Secondary | ICD-10-CM

## 2019-04-21 NOTE — Patient Instructions (Addendum)
Dyspnea on exertion -  Exercise induced bronchospasm   - Plan: you have exercise asthma. As discussed we can monitor with albuterol pre-exercise but make sure you do warm up and cool down. If this does not work, montelukast is an option  Mediastinal adenopathy - due to sarcod  - Plan: seeen 2017 CT chest and mild. Followup CXR was clear. Given stability of symptoms we can follow clinicailly but at some point maybe next 6-18 months can do another CT chest  Followup - 12 months or sooner  - follow covid-19 safety precaution - you are at high risk of getting very ill from it

## 2019-04-21 NOTE — Progress Notes (Signed)
OV 08/29/2016  Chief Complaint  Patient presents with  . Follow-up    Pt here after CT chest. Pt denies change in SOB. Pt c/o prod cough - worse at night and occassional epigastric pain. Pt denies f/c/s.    Follow-up neurosarcoidosis associated mediastinal adenopathy. Last seen by [facility] rheumatology in September 2017. Notes reviewed. He continues to be on Rituxan. He was tapered off prednisone in April 2016. He has lost and no pulmonary symptoms. The only symptom is occasional mild thoracic radiculopathy especially at night. But even this is improved. No dyspnea. He's no longer exercising and is mostly sedentary. He is up-to-date with his flu shot. Here to review CT chest October 2017 for his medicine adenopathy and it shows only mildly enlarged right hilar lymph node similar to prior exams. Pulmonary parenchyma is fine. He denies any cough or shortness of breath   IMPRESSION: 08/22/16 compared to oct 2015 1. Persistent borderline and mildly enlarged right hilar lymph nodes, similar to prior examinations. This is presumably related to underlying sarcoidosis in this individual. 2. No acute findings in the thorax to account for the patient's symptoms.   Electronically Signed   By: Vinnie Langton M.D.   On: 08/22/2016 08:54    has a past medical history of Asthma  OV 07/16/2017  Chief Complaint  Patient presents with  . Follow-up    Pt states overall he is doing well. Pt states he sees a new neurologist at [facility].    Follow-up neurosarcoidosis with mediastinal adenopathy  Is a one year follow-up. He has some associated mild shortness of breath is significantly improved. He also thoracic radiculopathy with results as significantly improved. He still has the symptoms but they're very mild. His follow-up is at [facility]. He gets Rituxan every 6 months last dose August 2018 he did apparently MRI that showed significant improvement. He will have flu shot today although he  wondered if he can have a couple months or a month after Rituxan. He is open to having as needed follow-up   OV 04/17/2018  Chief Complaint  Patient presents with  . Follow-up    Pt states he has been doing well since last visit. States he has concerns to the point when he becomes anxious and has problems catching his breath and also has problems breathing fully through his nose.   Follow-up neurosarcoidosis with mediastinal adenopathy  Last seen September 2018.  He basically comes here for supportive care and observation follow-up.  He follows regularly at [facility]  He gets 37-month Rituxan there.  But most recently because of change in insurance and also because of depressed white counts while he was on Tegretol for neuropathy there was a delay in his Rituxan and most recently gotis Rituxan only in May/June 2019.  He works at my eye doctor shop.  Overall he is doing well.  Since I last saw him he has  hlost weight.  He has become physically fit.  He works out in a gym 2 days a week and a heavy exercise.  He says that he does warm up.  However he does not cooldown.  On some occasions in the setting of heavy exercise he will feel dizzy and feel like things are passing out.  He does not shortness of breath.  He is worried about this.  Most recent chest x-ray was in fall 2018 at [facility].  I reviewed the results although I do not have the image  to visualize.  It is reported as clear.     OV 04/21/2019  Subjective:  Patient ID: [name], male , DOB: August 14, 1981 , age 38 y.o. , MRN: [id number] , ADDRESS: [place] [address]   04/21/2019 -   Chief Complaint  Patient presents with  . Shortness of Breath    will also feel lightheaded and will use inhaler before exercise.   Follow-up neurosarcoidosis with mediastinal adenopathy -immunosuppressed on every 6 months Rituxan with chronic prednisone and prophylactic Bactrim Follow-up exercise-induced  bronchospasm was seen in pulmonary stress test July 2019  HPI MYKAL BATIZ 38 y.o. -returns for 1 year follow-up.  He continues to do well overall.  He continues with his Rituxan last dose was couple months ago.  He follows at [facility].  He tells me he continues to have the shortness of breath with exertion.  He has white sputum.  It is mild and exercise related.  He is happy taking albuterol for this.  He does not do warm up and cool down.  He is no longer working in [employer]  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  His last MRI of the spine was in May 2020 at [facility].  Showed baseline abnormal hyperintensity in the dorsal cord extending from C5 to T2 and T4-T6.  His last chest x-ray was in October 2018 at [facility] which was clear.  His last CT chest was October 2017 and showed mild mediastinal adenopathy that was persistent at that time.     ROS - per HPI     has a past medical history of Asthma, Bronchitis, GERD (gastroesophageal reflux disease), Sarcoidosis, and Subacute transverse myelitis ([place]).   reports that he has never smoked. He has never used smokeless tobacco.  Past Surgical History:  Procedure Laterality Date  . LYMPH NODE BIOPSY N/A 10/13/2014   Procedure: LYMPH NODE BIOPSY;  Surgeon: Melrose Nakayama, MD;  Location: [place];  Service: Thoracic;  Laterality: N/A;  . MEDIASTINOSCOPY N/A 10/13/2014   Procedure: MEDIASTINOSCOPY;  Surgeon: Melrose Nakayama, MD;  Location: [place];  Service: Thoracic;  Laterality: N/A;  . NO PAST SURGERIES    . VIDEO BRONCHOSCOPY WITH ENDOBRONCHIAL ULTRASOUND N/A 09/08/2014   Procedure: VIDEO BRONCHOSCOPY WITH ENDOBRONCHIAL ULTRASOUND;  Surgeon: Brand Males, MD;  Location[facility];  Service: Thoracic;  Laterality: N/A;    No Known Allergies  Immunization History  Administered Date(s) Administered  . Influenza Split 08/06/2015  . Influenza,inj,Quad PF,6+ Mos 08/09/2014,  07/06/2016, 07/16/2017  . Influenza-Unspecified 08/09/2014  . Pneumococcal Conjugate-13 11/12/2014  . Pneumococcal Polysaccharide-23 03/07/2015  . Pneumococcal-Unspecified 03/07/2015  . Tdap 11/15/2016    Family History  Problem Relation Age of Onset  . Thyroid disease Mother   . Seizures Father   . Diabetes Maternal Grandmother   . Seizures Maternal Grandfather      Current Outpatient Medications:  .  albuterol (PROVENTIL HFA) 108 (90 Base) MCG/ACT inhaler, INHALE 2 PUFFS EVERY 6 HOURS AS NEEDED FOR WHEEZING OR SHORTNESS OF BREATH, Disp: 1 each, Rfl: 2 .  Cholecalciferol (VITAMIN D3) 3000 units TABS, Take 4,000 Units by mouth daily., Disp: , Rfl:  .  docusate sodium (COLACE) 100 MG capsule, Take 100 mg by mouth daily as needed for mild constipation. Reported on 04/10/2016, Disp: , Rfl:  .  gabapentin (NEURONTIN) 400 MG capsule, Take 2 capsules by mouth 3 (three) times daily., Disp: ,  Rfl:  .  ibuprofen (ADVIL,MOTRIN) 200 MG tablet, Take 200 mg by mouth as needed for moderate pain. , Disp: , Rfl:  .  pantoprazole (PROTONIX) 40 MG tablet, Take 1 tablet (40 mg total) by mouth daily., Disp: 90 tablet, Rfl: 1 .  predniSONE (DELTASONE) 10 MG tablet, Take 3 pills/day (30mg ) for one month,then 20 mg/day for one month, then 15 mg/day until follow-up. After breakfast., Disp: , Rfl:  .  RiTUXimab (RITUXAN IV), Inject 1 each into the vein every 6 (six) months. , Disp: , Rfl:  .  sulfamethoxazole-trimethoprim (BACTRIM,SEPTRA) 400-80 MG tablet, Take 1 tablet by mouth daily., Disp: , Rfl:       Objective:   Vitals:   04/21/19 1527  BP: (!) 148/76  Pulse: 66  Temp: 98.6 F (37 C)  SpO2: 100%  Weight: 184 lb 9.6 oz (83.7 kg)  Height: 5\' 6"  (1.676 m)    Estimated body mass index is 29.8 kg/m as calculated from the following:   Height as of this encounter: 5\' 6"  (1.676 m).   Weight as of this encounter: 184 lb 9.6 oz (83.7 kg).  @WEIGHTCHANGE @  [employer]   04/21/19 1527  Weight:  184 lb 9.6 oz (83.7 kg)     Physical Exam  General Appearance:    Alert, cooperative, no distress, appears stated age - yes , Deconditioned looking - no , OBESE  - no, Sitting on Wheelchair -  no  Head:    Normocephalic, without obvious abnormality, atraumatic  Eyes:    PERRL, conjunctiva/corneas clear,  Ears:    Normal TM's and external ear canals, both ears  Nose:   Nares normal, septum midline, mucosa normal, no drainage    or sinus tenderness. OXYGEN ON  - no . Patient is @ ra   Throat:   Lips, mucosa, and tongue normal; teeth and gums normal. Cyanosis on lips - no  Neck:   Supple, symmetrical, trachea midline, no adenopathy;    thyroid:  no enlargement/tenderness/nodules; no carotid   bruit or JVD  Back:     Symmetric, no curvature, ROM normal, no CVA tenderness  Lungs:     Distress - no , Wheeze no, Barrell Chest - no, Purse lip breathing - no, Crackles - no   Chest Wall:    No tenderness or deformity.    Heart:    Regular rate and rhythm, S1 and S2 normal, no rub   or gallop, Murmur - no  Breast Exam:    NOT DONE  Abdomen:     Soft, non-tender, bowel sounds active all four quadrants,    no masses, no organomegaly. Visceral obesity - no  Genitalia:   NOT DONE  Rectal:   NOT DONE  Extremities:   Extremities - normal, Has Cane - no, Clubbing - no, Edema - no  Pulses:   2+ and symmetric all extremities  Skin:   Stigmata of Connective Tissue Disease - no  Lymph nodes:   Cervical, supraclavicular, and axillary nodes normal  Psychiatric:  Neurologic:   Pleasant - yes, Anxious - no, Flat affect - no  CAm-ICU - neg, Alert and Oriented x 3 - yes, Moves all 4s - yes, Speech - normal, Cognition - intact           Assessment:       ICD-10-CM   1. Dyspnea on exertion  R06.09   2. Exercise induced bronchospasm  J45.990   3. Mediastinal adenopathy  R59.0  Plan:     Patient Instructions  Dyspnea on exertion -  Exercise induced bronchospasm   - Plan: you have exercise  asthma. As discussed we can monitor with albuterol pre-exercise but make sure you do warm up and cool down. If this does not work, montelukast is an option  Mediastinal adenopathy - due to sarcod  - Plan: seeen 2017 CT chest and mild. Followup CXR was clear. Given stability of symptoms we can follow clinicailly but at some point maybe next 6-18 months can do another CT chest  Followup - 12 months or sooner  - follow covid-19 safety precaution - you are at high risk of getting very ill from it          SIGNATURE    Dr. Brand Males, M.D., F.C.C.P,  Pulmonary and Critical Care Medicine Staff Physician, [place] Director - Interstitial Lung Disease  Program  Pulmonary [place] at [place], [place], [address]  Pager: [phone], If no answer or between  15:00h - 7:00h: call [phone] Telephone: [phone]  4:45 PM 04/21/2019

## 2019-05-11 MED FILL — ALPRAZolam 0.25 MG TABS: 0.25 | 15 days supply | Qty: 30 | Fill #0

## 2019-05-11 MED FILL — PANTOPRAZOLE SOD DR 20 MG T: 20 | 30 days supply | Qty: 30 | Fill #0

## 2019-05-11 MED FILL — GABAPENTIN 400 MG CAPSULE: 400 | 30 days supply | Qty: 180 | Fill #3

## 2019-05-11 MED FILL — predniSONE 5 MG TABS: 5 | 30 days supply | Qty: 120 | Fill #0

## 2019-05-11 MED FILL — SULFAMETHOXAZOLE-TMP SS TAB: 400-80 | 30 days supply | Qty: 30 | Fill #1

## 2019-06-05 ENCOUNTER — Other Ambulatory Visit: Payer: Self-pay | Admitting: *Deleted

## 2019-06-05 MED ORDER — ALBUTEROL SULFATE HFA 108 (90 BASE) MCG/ACT IN AERS: INHALATION_SPRAY | RESPIRATORY_TRACT | 5 refills | Status: DC

## 2019-06-05 MED FILL — VENTOLIN HFA 90 MCG INHALER: 108 (90 BAS | 25 days supply | Qty: 18 | Fill #0

## 2019-06-07 MED FILL — PANTOPRAZOLE SOD DR 20 MG T: 20 | 30 days supply | Qty: 30 | Fill #1

## 2019-06-07 MED FILL — GABAPENTIN 400 MG CAPSULE: 400 | 30 days supply | Qty: 180 | Fill #4

## 2019-06-07 MED FILL — SULFAMETHOXAZOLE-TMP SS TAB: 400-80 | 30 days supply | Qty: 30 | Fill #2

## 2019-07-05 MED FILL — PANTOPRAZOLE SOD DR 20 MG T: 20 | 30 days supply | Qty: 30 | Fill #2

## 2019-07-05 MED FILL — GABAPENTIN 400 MG CAPSULE: 400 | 30 days supply | Qty: 180 | Fill #5

## 2019-07-06 MED FILL — ALPRAZolam 0.25 MG TABS: 0.25 | 30 days supply | Qty: 30 | Fill #0

## 2019-07-06 MED FILL — SULFAMETHOXAZOLE-TMP SS TAB: 400-80 | 30 days supply | Qty: 30 | Fill #0

## 2019-08-09 MED FILL — predniSONE 5 MG TABS: 5 | 30 days supply | Qty: 97 | Fill #1

## 2019-08-09 MED FILL — SULFAMETHOXAZOLE-TMP SS TAB: 400-80 | 30 days supply | Qty: 30 | Fill #1

## 2019-08-09 MED FILL — PANTOPRAZOLE SOD DR 20 MG T: 20 | 30 days supply | Qty: 30 | Fill #3

## 2019-08-10 MED FILL — GABAPENTIN 400 MG CAPSULE: 400 | 30 days supply | Qty: 180 | Fill #1

## 2019-09-03 MED FILL — PANTOPRAZOLE SOD DR 20 MG T: 20 | 30 days supply | Qty: 30 | Fill #4

## 2019-09-05 MED FILL — GABAPENTIN 400 MG CAPSULE: 400 | 30 days supply | Qty: 180 | Fill #2

## 2019-09-05 MED FILL — SULFAMETHOXAZOLE-TMP SS TAB: 400-80 | 30 days supply | Qty: 30 | Fill #2

## 2019-09-19 MED FILL — ALPRAZolam 0.25 MG TABS: 0.25 | 30 days supply | Qty: 30 | Fill #0

## 2019-09-26 MED FILL — PANTOPRAZOLE SOD DR 20 MG T: 20 | 30 days supply | Qty: 30 | Fill #5

## 2019-09-30 MED FILL — predniSONE 1 MG TABS: 1 | 31 days supply | Qty: 31 | Fill #0

## 2019-10-02 MED FILL — GABAPENTIN 400 MG CAPSULE: 400 | 30 days supply | Qty: 180 | Fill #3

## 2019-10-10 MED FILL — predniSONE 1 MG TABS: 1 | 28 days supply | Qty: 98 | Fill #0

## 2019-10-14 MED FILL — SULFAMETHOXAZOLE-TMP SS TAB: 400-80 | 30 days supply | Qty: 30 | Fill #3

## 2019-10-23 ENCOUNTER — Other Ambulatory Visit: Payer: Self-pay

## 2019-10-23 DIAGNOSIS — Z20822 Contact with and (suspected) exposure to covid-19: Secondary | ICD-10-CM

## 2019-10-24 LAB — NOVEL CORONAVIRUS, NAA: SARS-CoV-2, NAA: NOT DETECTED

## 2019-10-24 LAB — SPECIMEN STATUS REPORT

## 2019-10-28 MED FILL — PANTOPRAZOLE SOD DR 20 MG T: 20 | 30 days supply | Qty: 30 | Fill #0

## 2019-11-02 MED FILL — predniSONE 1 MG TABS: 1 | 14 days supply | Qty: 49 | Fill #1

## 2019-11-03 MED FILL — GABAPENTIN 400 MG CAPSULE: 400 | 30 days supply | Qty: 180 | Fill #0

## 2019-11-09 MED FILL — SULFAMETHOXAZOLE-TMP SS TAB: 400-80 | 30 days supply | Qty: 30 | Fill #0

## 2019-11-23 MED FILL — PANTOPRAZOLE SOD DR 20 MG T: 20 | 30 days supply | Qty: 30 | Fill #1

## 2019-12-13 MED FILL — GABAPENTIN 400 MG CAPSULE: 400 | 30 days supply | Qty: 180 | Fill #1

## 2019-12-21 MED FILL — PANTOPRAZOLE SOD DR 20 MG T: 20 | 30 days supply | Qty: 30 | Fill #0

## 2020-01-08 MED FILL — GABAPENTIN 400 MG CAPSULE: 400 | 30 days supply | Qty: 180 | Fill #2

## 2020-01-15 MED FILL — PANTOPRAZOLE SOD DR 20 MG T: 20 | 30 days supply | Qty: 30 | Fill #0

## 2020-02-08 MED FILL — GABAPENTIN 400 MG CAPSULE: 400 | 30 days supply | Qty: 180 | Fill #3

## 2020-02-12 MED FILL — PANTOPRAZOLE SOD DR 20 MG T: 20 | 30 days supply | Qty: 30 | Fill #0

## 2020-03-09 MED FILL — GABAPENTIN 400 MG CAPSULE: 400 | 30 days supply | Qty: 180 | Fill #4

## 2020-03-11 MED FILL — DOXYCYCLINE HYCLATE 100 MG: 100 | 30 days supply | Qty: 60 | Fill #0

## 2020-03-11 MED FILL — CLINDAMYCIN PH 1% SOLUTION: 1 | 15 days supply | Qty: 30 | Fill #0

## 2020-03-11 MED FILL — TRETINOIN 0.025% CREAM: 0.025 | 14 days supply | Qty: 20 | Fill #0

## 2020-04-01 MED FILL — GABAPENTIN 400 MG CAPSULE: 400 | 30 days supply | Qty: 180 | Fill #0

## 2020-04-07 MED FILL — CLINDAMYCIN PH 1% SOLUTION: 1 | 15 days supply | Qty: 30 | Fill #1

## 2020-05-07 MED FILL — GABAPENTIN 400 MG CAPSULE: 400 | 30 days supply | Qty: 180 | Fill #1

## 2020-05-30 MED FILL — CLINDAMYCIN PH 1% SOLUTION: 1 | 30 days supply | Qty: 60 | Fill #2

## 2020-06-10 MED FILL — GABAPENTIN 400 MG CAPSULE: 400 | 30 days supply | Qty: 180 | Fill #2

## 2020-06-10 MED FILL — CLINDAMYCIN PH 1% SOLUTION: 1 | 15 days supply | Qty: 30 | Fill #2

## 2020-06-22 ENCOUNTER — Other Ambulatory Visit (HOSPITAL_COMMUNITY): Payer: Self-pay | Admitting: Psychiatry

## 2020-06-23 MED FILL — FAMOTIDINE 20 MG TABLET: 20 | 30 days supply | Qty: 60 | Fill #0

## 2020-06-23 MED FILL — predniSONE 20 MG TABS: 20 | 30 days supply | Qty: 30 | Fill #0

## 2020-07-08 ENCOUNTER — Other Ambulatory Visit (HOSPITAL_COMMUNITY): Payer: Self-pay | Admitting: Psychiatry

## 2020-07-08 MED FILL — GABAPENTIN 400 MG CAPSULE: 400 | 30 days supply | Qty: 210 | Fill #0

## 2020-07-22 MED FILL — predniSONE 20 MG TABS: 20 | 30 days supply | Qty: 30 | Fill #1

## 2020-07-27 MED FILL — FAMOTIDINE 20 MG TABLET: 20 | 30 days supply | Qty: 60 | Fill #0

## 2020-08-09 MED FILL — GABAPENTIN 400 MG CAPSULE: 400 | 30 days supply | Qty: 210 | Fill #1

## 2020-08-22 MED FILL — FAMOTIDINE 20 MG TABLET: 20 | 30 days supply | Qty: 60 | Fill #1

## 2020-09-07 MED FILL — FAMOTIDINE 20 MG TABLET: 20 | 30 days supply | Qty: 60 | Fill #0

## 2020-09-15 ENCOUNTER — Other Ambulatory Visit (HOSPITAL_COMMUNITY): Payer: Self-pay | Admitting: Psychiatry

## 2020-09-16 MED FILL — predniSONE 1 MG TABS: 1 | 30 days supply | Qty: 252 | Fill #0

## 2020-09-27 ENCOUNTER — Ambulatory Visit (INDEPENDENT_AMBULATORY_CARE_PROVIDER_SITE_OTHER): Payer: 59 | Admitting: Internal Medicine

## 2020-09-27 ENCOUNTER — Encounter: Payer: Self-pay | Admitting: Internal Medicine

## 2020-09-27 ENCOUNTER — Other Ambulatory Visit: Payer: Self-pay

## 2020-09-27 VITALS — BP 132/82 | HR 61 | Temp 97.9°F | Ht 66.0 in | Wt 207.8 lb

## 2020-09-27 DIAGNOSIS — Z7185 Encounter for immunization safety counseling: Secondary | ICD-10-CM

## 2020-09-27 DIAGNOSIS — Z7184 Encounter for health counseling related to travel: Secondary | ICD-10-CM | POA: Diagnosis not present

## 2020-09-27 DIAGNOSIS — R0789 Other chest pain: Secondary | ICD-10-CM

## 2020-09-27 NOTE — Progress Notes (Addendum)
OV 09/27/2020  Subjective:  Patient ID: Gregory Fuller, male , DOB: [birth year] , age 39 y.o. , MRN: [id number] , ADDRESS: [place] [id number] PCP Delilah Shan, MD Patient Care Team: Delilah Shan, MD as PCP - General (Family Medicine) Brand Males, MD as Consulting Physician (Pulmonary Disease) Carolan Shiver, MD as Consulting Physician (Rheumatology)  This Provider for this visit: Treatment Team:  Attending Provider: Brand Males, MD  OV 08/29/2016      Chief Complaint  Patient presents with   Follow-up    Pt here after CT chest. Pt denies change in SOB. Pt c/o prod cough - worse at night and occassional epigastric pain. Pt denies f/c/s.    Follow-up neurosarcoidosis associated mediastinal adenopathy. Last seen by [facility] rheumatology in September 2017. Notes reviewed. He continues to be on Rituxan. He was tapered off prednisone in April 2016. He has lost and no pulmonary symptoms. The only symptom is occasional mild thoracic radiculopathy especially at night. But even this is improved. No dyspnea. He's no longer exercising and is mostly sedentary. He is up-to-date with his flu shot. Here to review CT chest October 2017 for his medicine adenopathy and it shows only mildly enlarged right hilar lymph node similar to prior exams. Pulmonary parenchyma is fine. He denies any cough or shortness of breath  IMPRESSION: 08/22/16 compared to oct 2015 1. Persistent borderline and mildly enlarged right hilar lymph nodes, similar to prior examinations. This is presumably related to underlying sarcoidosis in this individual. 2. No acute findings in the thorax to account for the patient's symptoms.  Electronically Signed By: Vinnie Langton M.D. On: 08/22/2016 08:54   has a past medical history of Asthma  OV 07/16/2017      Chief Complaint  Patient presents with   Follow-up    Pt states overall he is doing well. Pt states he sees a new  neurologist at [facility].    Follow-up neurosarcoidosis with mediastinal adenopathy  Is a one year follow-up. He has some associated mild shortness of breath is significantly improved. He also thoracic radiculopathy with results as significantly improved. He still has the symptoms but they're very mild. His follow-up is at [facility]. He gets Rituxan every 6 months last dose August 2018 he did apparently MRI that showed significant improvement. He will have flu shot today although he wondered if he can have a couple months or a month after Rituxan. He is open to having as needed follow-up  OV 04/17/2018      Chief Complaint  Patient presents with   Follow-up    Pt states he has been doing well since last visit. States he has concerns to the point when he becomes anxious and has problems catching his breath and also has problems breathing fully through his nose.   Follow-up neurosarcoidosis with mediastinal adenopathy  Last seen September 2018.  He basically comes here for supportive care and observation follow-up.  He follows regularly at [facility].  He gets 41-month Rituxan there.  But most recently because of change in insurance and also because of depressed white counts while he was on Tegretol for neuropathy there was a delay in his Rituxan and most recently gotis Rituxan only in May/June 2019.  He works at my eye doctor shop.  Overall he is doing well.  Since I last saw him he has  hlost weight.  He has become physically fit.  He works out in  a gym 2 days a week and a heavy exercise.  He says that he does warm up.  However he does not cooldown.  On some occasions in the setting of heavy exercise he will feel dizzy and feel like things are passing out.  He does not shortness of breath.  He is worried about this.  Most recent chest x-ray was in fall 2018 at [facility].  I reviewed the results although I do not have the image to visualize.  It is reported as clear.  OV  04/21/2019  04/21/2019 -       Chief Complaint  Patient presents with   Shortness of Breath    will also feel lightheaded and will use inhaler before exercise.   Follow-up neurosarcoidosis with mediastinal adenopathy -immunosuppressed on every 6 months Rituxan with chronic prednisone and prophylactic Bactrim Follow-up exercise-induced bronchospasm was seen in pulmonary stress test July 2019  HPI Gregory Fuller 40 y.o. -returns for 1 year follow-up.  He continues to do well overall.  He continues with his Rituxan last dose was couple months ago.  He follows at [place].  He tells me he continues to have the shortness of breath with exertion.  He has white sputum.  It is mild and exercise related.  He is happy taking albuterol for this.  He does not do warm up and cool down.  He is no longer working in [employer]  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  His last MRI of the spine was in May 2020 at [facility].  Showed baseline abnormal hyperintensity in the dorsal cord extending from C5 to T2 and T4-T6.  His last chest x-ray was in October 2018 at [facility] which was clear.  His last CT chest was October 2017 and showed mild mediastinal adenopathy that was persistent at that time.   09/27/2020 -   Chief Complaint  Patient presents with   Follow-up    DOE, doing better   HPI Gregory Fuller 39 y.o. - with a PMHx of neurosarcoidosis with transverse myelitis who presents today for a 1 year follow up. He denies any SOB at rest or with exertion. He has been able to work out with his trainer without breathing difficulties. Occasionally, Gregory Fuller notes he becomes overheated and has to take a break during his workouts, but this generally happens when he is out of shape. Gregory Fuller also notes that a few months ago, he was having a "popping" sensation that would occur when he took deep breathes lying on his left side. This did not occur on his right  side or on expiration. It was not painful. His rheumatologist recommend he follow up with pulm regarding it. This has not reoccurred recently though.   Otherwise, Gregory Fuller states he had another sarcoid flare a few months ago and he was re-started on Prednisone. He is currently tapering down by 1 mg every 2 weeks; current dose of 8 mg. No other illness this year.   Last MRI brain with cervical and thoracic spine in September of 2021 that demonstrated no new changes or lesions; findings consistent with known neurosarcoidosis. Last CT chest in August 2020 that showed no evidence of pulmonary sarcoidosis but was positive for mild hilar adenopathy.   ROS - per HPI   has a past medical history of Asthma, Bronchitis, GERD (gastroesophageal reflux disease), Sarcoidosis, and Subacute transverse myelitis ([place]).   reports that he has  never smoked. He has never used smokeless tobacco.  Past Surgical History:  Procedure Laterality Date   LYMPH NODE BIOPSY N/A 10/13/2014   Procedure: LYMPH NODE BIOPSY;  Surgeon: Melrose Nakayama, MD;  Location: [place];  Service: Thoracic;  Laterality: N/A;   MEDIASTINOSCOPY N/A 10/13/2014   Procedure: MEDIASTINOSCOPY;  Surgeon: Melrose Nakayama, MD;  Location: [place];  Service: Thoracic;  Laterality: N/A;   NO PAST SURGERIES     VIDEO BRONCHOSCOPY WITH ENDOBRONCHIAL ULTRASOUND N/A 09/08/2014   Procedure: VIDEO BRONCHOSCOPY WITH ENDOBRONCHIAL ULTRASOUND;  Surgeon: Brand Males, MD;  Location: [place];  Service: Thoracic;  Laterality: N/A;    No Known Allergies  Immunization History  Administered Date(s) Administered   Influenza Split 08/06/2015, 07/16/2017, 08/04/2018, 08/06/2019, 08/08/2020   Influenza,inj,Quad PF,6+ Mos 08/09/2014, 07/06/2016, 07/16/2017, 08/06/2019, 08/08/2020   Influenza-Unspecified 08/09/2014, 08/04/2018, 08/06/2019   PFIZER SARS-COV-2 Vaccination 01/15/2020, 02/08/2020   Pneumococcal Conjugate-13 11/12/2014   Pneumococcal  Polysaccharide-23 03/07/2015   Pneumococcal-Unspecified 03/07/2015   Tdap 11/15/2016    Family History  Problem Relation Age of Onset   Thyroid disease Mother    Seizures Father    Diabetes Maternal Grandmother    Seizures Maternal Grandfather      Current Outpatient Medications:    albuterol (PROVENTIL HFA) 108 (90 Base) MCG/ACT inhaler, INHALE 2 PUFFS EVERY 6 HOURS AS NEEDED FOR WHEEZING OR SHORTNESS OF BREATH, Disp: 18 g, Rfl: 5   Cholecalciferol (VITAMIN D3) 3000 units TABS, Take 4,000 Units by mouth daily., Disp: , Rfl:    docusate sodium (COLACE) 100 MG capsule, Take 100 mg by mouth daily as needed for mild constipation. Reported on 04/10/2016, Disp: , Rfl:    famotidine (PEPCID) 20 MG tablet, Take by mouth., Disp: , Rfl:    fluticasone (FLONASE) 50 MCG/ACT nasal spray, Place into the nose., Disp: , Rfl:    gabapentin (NEURONTIN) 400 MG capsule, Take 2 capsules by mouth 3 (three) times daily., Disp: , Rfl:    ibuprofen (ADVIL,MOTRIN) 200 MG tablet, Take 200 mg by mouth as needed for moderate pain. , Disp: , Rfl:    pantoprazole (PROTONIX) 40 MG tablet, Take 1 tablet (40 mg total) by mouth daily., Disp: 90 tablet, Rfl: 1   predniSONE (DELTASONE) 10 MG tablet, Take 3 pills/day (30mg ) for one month,then 20 mg/day for one month, then 15 mg/day until follow-up. After breakfast., Disp: , Rfl:    RiTUXimab (RITUXAN IV), Inject 1 each into the vein every 6 (six) months. , Disp: , Rfl:    sulfamethoxazole-trimethoprim (BACTRIM,SEPTRA) 400-80 MG tablet, Take 1 tablet by mouth daily., Disp: , Rfl:       Objective:   Vitals:   09/27/20 0900  BP: 132/82  Pulse: 61  Temp: 97.9 F (36.6 C)  TempSrc: Oral  SpO2: 100%  Weight: 207 lb 12.8 oz (94.3 kg)  Height: 5\' 6"  (1.676 m)    Estimated body mass index is 33.54 kg/m as calculated from the following:   Height as of this encounter: 5\' 6"  (1.676 m).   Weight as of this encounter: 207 lb 12.8 oz (94.3  kg).  @WEIGHTCHANGE @  [employer]   09/27/20 0900  Weight: 207 lb 12.8 oz (94.3 kg)    Physical Exam  General: No distress.  Neuro: Alert and Oriented x 3. GCS 15. Speech normal Psych: Pleasant Resp:  Clear to auscultation bilaterally. No overt respiratory distress CVS: Normal heart sounds. Murmurs - None  HEENT: Normal upper airway. PEERL +. No post nasal drip  Assessment:       ICD-10-CM   1. Vaccine counseling  Z71.85 SAR CoV2 Serology (COVID 19)AB(IGG)IA  2. Counseling about travel  Z71.84   3. Sensation of chest pressure  R07.89        Plan:     Patient Instructions  1. Vaccine counseling 2. Counseling about travel Today, we discussed that there is a good chance your body did not mount antibodies to the COVID-19 vaccine due to Rituximab. Due to this, we will check your IgG levels to COVID-19. If negative, you will need to receive your booster shot sooner than later.   Additionally, you will be at higher risk to contract COVID-19 due to lack of antibodies. It will be essential you always wear your mask. Avoid eating indoors or cluster grouping.   Plan:  - Order IgG COVID-19 serology  - Follow COVID-19 precautions   3. Sensation of chest pressure You mentioned a popping sensation with deep inspiration. The cause of this is unknown at this time. If the sensation re-occurs, please let us know immediately, as you may need additional imaging at that time.  Plan:  - Continue to monitor for re-occurrence  - Consider CT chest at that time   Followup - 12 months or sooner if needed with Dr. Gaye Alken   Dr. Jose Persia Internal Medicine PGY-2  09/27/2020, 9:55 AM     ATTESTATION & SIGNATURE   STAFF NOTE: I, Dr Ann Lions have personally reviewed patient's available data, including medical history, events of note, physical examination and test results as part of my evaluation. I have discussed with resident/NP and other care providers  such as pharmacist, RN and RRT.  In addition,  I personally evaluated patient and elicited key findings of   S: Date of admit (Not on file) with LOS 0 for today 09/27/2020 : Gregory Fuller is  *-returns for follow-up of his sarcoid issues.  From a pulmonary standpoint he has very little in the way of pulmonary sarcoid except some very mild mediastinal adenopathy.  He continues to follow-up at [facility].  Early November 2021 when he got his Rituxan after 6 months.  His last Covid vaccine was in April 2021.  He did not get his booster prior to the most recent dose of Rituxan because his B cells were not significantly reemerged.  However he is planning a trip to [place] in early 2022 and he believes he is going to get the Covid booster right before that trip which would be still within 8 to 12 weeks since the most recent Rituxan.  Overall he is feeling well without any respiratory complaints.  His only other issue was that approximately 2 weeks ago one time he had some crackling noise in the front of his chest it was brief and then it resolved.  No further recurrence no associated symptoms.  O:  Blood pressure 132/82, pulse 61, temperature 97.9 F (36.6 C), temperature source Oral, height 5\' 6"  (1.676 m), weight 207 lb 12.8 oz (94.3 kg), SpO2 100 %.   Looks well clear to auscultation pleasant alert and oriented x3.  Might have gained weight since last visit.   LABS    PULMONARY No results for input(s): PHART, PCO2ART, PO2ART, HCO3, TCO2, O2SAT in the last 168 hours.  Invalid input(s): PCO2, PO2  CBC No results for input(s): HGB, HCT, WBC, PLT in the last 168 hours.  COAGULATION No results for input(s): INR in the  last 168 hours.  CARDIAC  No results for input(s): TROPONINI in the last 168 hours. No results for input(s): PROBNP in the last 168 hours.   CHEMISTRY No results for input(s): NA, K, CL, CO2, GLUCOSE, BUN, CREATININE, CALCIUM, MG, PHOS in the last 168 hours. CrCl cannot  be calculated (Patient's most recent lab result is older than the maximum 21 days allowed.).   LIVER No results for input(s): AST, ALT, ALKPHOS, BILITOT, PROT, ALBUMIN, INR in the last 168 hours.   INFECTIOUS No results for input(s): LATICACIDVEN, PROCALCITON in the last 168 hours.   ENDOCRINE CBG (last 3)  No results for input(s): GLUCAP in the last 72 hours.       IMAGING x24h  - image(s) personally visualized  -   highlighted in bold No results found.    A: Neurosarcoidosis-follow-up at [facility] on immunosuppressive treatment  Mild mediastinal adenopathy -stable on several scans most recent August 2020.  No further intervention from our side  Mild crackling noise in the chest x1 and resolved -we will keep expectant follow-up  Covid vaccine counseling/travel advice: He is immunosuppressed with Rituxan and there are no B cells.  I doubt he has had any antibody response to his Covid vaccine with the last second shot being in April 2021.  We will check a Covid IgG and if this is negative recommend a booster knowing fully well that given the presence of most recent Rituxan he is very unlikely to mount an antibody response.  If his antibodies are negative despite booster then he might qualify for Regeneron 2176  study that might be available in the not so distant future    Anti-infectives (From admission, onward)   None       Rest per NP/medical resident whose note is outlined above and that I agree with    Dr. Brand Males, M.D., [facility].C.P Pulmonary and Critical Care Medicine Staff Physician [facility] Pager: [phone], If no answer or between  15:00h - 7:00h: call [phone]  09/27/2020 10:19 AM

## 2020-09-27 NOTE — Patient Instructions (Addendum)
1. Vaccine counseling 2. Counseling about travel Today, we discussed that there is a good chance your body did not mount antibodies to the COVID-19 vaccine due to Rituximab. Due to this, we will check your IgG levels to COVID-19. If negative, you will need to receive your booster shot sooner than later.   Additionally, you will be at higher risk to contract COVID-19 due to lack of antibodies. It will be essential you always wear your mask. Avoid eating indoors or cluster grouping.   Plan:  - Order IgG COVID-19 serology  - Follow COVID-19 precautions   3. Sensation of chest pressure You mentioned a popping sensation with deep inspiration. The cause of this is unknown at this time. If the sensation re-occurs, please let us know immediately, as you may need additional imaging at that time.  Plan:  - Continue to monitor for re-occurrence  - Consider CT chest at that time   Followup - 12 months or sooner if needed with Dr. Chase Caller

## 2020-09-28 ENCOUNTER — Telehealth: Payer: Self-pay | Admitting: Internal Medicine

## 2020-09-28 LAB — SARS-COV-2 ANTIBODY(IGG)SPIKE,SEMI-QUANTITATIVE: SARS COV1 AB(IGG)SPIKE,SEMI QN: <1 index (ref <1.00)

## 2020-09-28 NOTE — Telephone Encounter (Signed)
His SARS-CoV-2 IgG antibody is negative.  This is either a reflection of greater than 6 months and since Covid vaccine or because of Rituxan is not able to mount an antibody response or both.  Plan -He should get his Covid vaccine booster at least 2 or 3 weeks before his trip to [place] but even then because he got his Rituxan most recently early November 2021 I doubt he is going to mount an antibody response and therefore he is going to be vulnerable to COVID-19 and its devastating effects because he is a high risk patient given his immunosuppression   -He should continue masking and avoiding clustering especially indoors.  He should avoid eating with other people which is a major point of spread  -He should consider only mingling with vaccinated people and he should be masking at all times even if they are vaccinated   -At some point next year if he continues to be Covid antibody negative he might be candidate for a study that gives Regeneron antibodies against COVID once a month for 1 year  -If he ever gets Covid he should call the Covid hotline and he will be a candidate for monoclonal antibody infusion therapy  Results for ROLLEN, SELDERS (MRN [id number]) as of 09/28/2020 11:40  Ref. Range 09/27/2020 10:12  SARS COV1 AB(IGG)SPIKE,SEMI QN Latest Ref Range: <1.00 index <1.00

## 2020-09-28 NOTE — Telephone Encounter (Signed)
Attempted to call pt but line went straight to VM. Left message for him to return call. 

## 2020-09-30 NOTE — Telephone Encounter (Signed)
Responding to pt via email since he also sent mychart msg

## 2020-09-30 NOTE — Telephone Encounter (Signed)
Pt asking for results of labs Please advise once you have reviewed these

## 2020-10-10 MED FILL — predniSONE 1 MG TABS: 1 | 30 days supply | Qty: 252 | Fill #0

## 2020-10-25 ENCOUNTER — Other Ambulatory Visit (HOSPITAL_COMMUNITY): Payer: Self-pay | Admitting: Optometrist

## 2020-10-25 MED FILL — NEO/POLYMYXIN/DEXAMETH DROP: 3.5-10000-0 | 7 days supply | Qty: 5 | Fill #0

## 2020-11-07 ENCOUNTER — Telehealth: Payer: Self-pay | Admitting: Internal Medicine

## 2020-11-07 NOTE — Telephone Encounter (Signed)
History of sarcoidosis  Popping sensation going on for few months, happened  again last 2 weeks . Last ov MR recommended he come back in for evaluation if reoccurred.   No chest pain , orthopnea, hemoptysis , fever, rash or cough/congestion.   OV scheduled with APP 11/08/20 for evaluation   Please contact office for sooner follow up if symptoms do not improve or worsen or seek emergency care

## 2020-11-08 ENCOUNTER — Other Ambulatory Visit: Payer: Self-pay

## 2020-11-08 ENCOUNTER — Encounter: Payer: Self-pay | Admitting: Pulmonary Disease

## 2020-11-08 ENCOUNTER — Ambulatory Visit (INDEPENDENT_AMBULATORY_CARE_PROVIDER_SITE_OTHER): Payer: BC Managed Care – PPO | Admitting: Pulmonary Disease

## 2020-11-08 VITALS — BP 122/72 | HR 90 | Temp 97.1°F | Ht 66.5 in | Wt 202.0 lb

## 2020-11-08 DIAGNOSIS — R918 Other nonspecific abnormal finding of lung field: Secondary | ICD-10-CM

## 2020-11-08 DIAGNOSIS — R0789 Other chest pain: Secondary | ICD-10-CM | POA: Diagnosis not present

## 2020-11-08 DIAGNOSIS — D8689 Sarcoidosis of other sites: Secondary | ICD-10-CM

## 2020-11-08 DIAGNOSIS — D869 Sarcoidosis, unspecified: Secondary | ICD-10-CM | POA: Diagnosis not present

## 2020-11-08 DIAGNOSIS — R079 Chest pain, unspecified: Secondary | ICD-10-CM | POA: Diagnosis not present

## 2020-11-08 LAB — COMPREHENSIVE METABOLIC PANEL
ALT: 13 U/L (ref 0–53)
AST: 15 U/L (ref 0–37)
Albumin: 4.9 g/dL (ref 3.5–5.2)
Alkaline Phosphatase: 40 U/L (ref 39–117)
BUN: 13 mg/dL (ref 6–23)
CO2: 28 mEq/L (ref 19–32)
Calcium: 9.4 mg/dL (ref 8.4–10.5)
Chloride: 104 mEq/L (ref 96–112)
Creatinine, Ser: 1.07 mg/dL (ref 0.40–1.50)
GFR: 88.04 mL/min (ref >60.00)
Glucose, Bld: 103 mg/dL — ABNORMAL HIGH (ref 70–99)
Potassium: 4 mEq/L (ref 3.5–5.1)
Sodium: 138 mEq/L (ref 135–145)
Total Bilirubin: 0.9 mg/dL (ref 0.2–1.2)
Total Protein: 7.1 g/dL (ref 6.0–8.3)

## 2020-11-08 NOTE — Assessment & Plan Note (Signed)
Unsure what to make of patient's sensation of chest pressure.  This is happened in the past.  When last evaluated by Dr. Marchelle Gearing it was felt that patient would benefit from a CT if this happened again.  We will proceed forward with CT chest with contrast given known history of sarcoidosis as well as chest wall discomfort with inspiration with popping sensation.  If CT chest with contrast is stable.  Will need to consider referral to cardiology for further evaluation and work-up.  Patient also would likely benefit from an echocardiogram.

## 2020-11-08 NOTE — Assessment & Plan Note (Signed)
History of stigmata of sarcoidosis of the lung Previously evaluated by Dr. Marchelle Gearing, patient had inspiratory "popping" sound/sensation.  This originally went away.  Came back about a week ago.  Previous recommendations from Dr. Marchelle Gearing were to order a CT chest if this did return.  Discussed this with patient today.  Patient would like to proceed forward with this.  Plan: We will order CT chest with contrast today to further evaluate popping sensation with inspiration when laying flat

## 2020-11-08 NOTE — Assessment & Plan Note (Signed)
Plan: Continue Rituxan infusions CT chest with contrast ordered today Lab work May need to consider echocardiogram/cardiology referral in the future

## 2020-11-08 NOTE — Assessment & Plan Note (Signed)
Plan: Continue follow-up with [facility] neuro Continue Rituxan infusions

## 2020-11-08 NOTE — Assessment & Plan Note (Signed)
History of atypical chest pain Patient reporting this atypical popping sensation/chest wall pain upon inspiration when laying down Benign physical exam today  Plan: Proceed forward with CT chest with contrast Lab work today If CT with contrast is stable, need to consider cardiology referral/echocardiogram

## 2020-11-08 NOTE — Patient Instructions (Addendum)
You were seen today by Lauraine Rinne, NP  for:   1. Sensation of chest pressure 2. Chest pain, unspecified type  - CT Chest W Contrast; Future  Lab work today  We will start with a CT of your chest to further evaluate this sensation of chest pressure, atypical chest pain  If CT chest is stable/within normal limits likely should consider referral to cardiology for further work-up  3. Sarcoidosis 4. Neurosarcoidosis in adult  Continue Rituxan infusion every 6 months  Continue to follow-up with [facility] rheumatology Continue to follow-up with [facility] neurology  5. Abnormal findings on diagnostic imaging of lung  History of mediastinal adenopathy likely directly related to known sarcoid  We will order CT chest with contrast to further evaluate    We recommend today:  Orders Placed This Encounter  Procedures  . CT Chest W Contrast    Standing Status:   Future    Standing Expiration Date:   11/08/2021    Order Specific Question:   If indicated for the ordered procedure, I authorize the administration of contrast media per Radiology protocol    Answer:   Yes    Order Specific Question:   Preferred imaging location?    Answer:   [place]  . Comp Met (CMET)    Standing Status:   Future    Standing Expiration Date:   11/08/2021   Orders Placed This Encounter  Procedures  . CT Chest W Contrast  . Comp Met (CMET)   No orders of the defined types were placed in this encounter.   Follow Up:    Return in about 4 weeks (around 12/06/2020), or if symptoms worsen or fail to improve, for Follow up with Dr. Purnell Shoemaker.   Notification of test results are managed in the following manner: If there are  any recommendations or changes to the  plan of care discussed in office today,  we will contact you and let you know what they are. If you do not hear from [place], then your results are normal and you can view them through your  MyChart account , or a letter will be sent to you. Thank you  again for trusting [place] with your care  - Thank you, [facility]    It is flu season:   >>> Best ways to protect herself from the flu: Receive the yearly flu vaccine, practice good hand hygiene washing with soap and also using hand sanitizer when available, eat a nutritious meals, get adequate rest, hydrate appropriately       Please contact the office if your symptoms worsen or you have concerns that you are not improving.   Thank you for choosing  [facility] for your healthcare, and for allowing [place] to partner with you on your healthcare journey. I am thankful to be able to provide care to you today.   Wyn Quaker FNP-C

## 2020-11-08 NOTE — Progress Notes (Signed)
@Patient  ID: Gregory Fuller, male    DOB: July 06, 1981, 40 y.o.   MRN: [id number]  No chief complaint on file.   Referring provider: Delilah Shan, MD  HPI:  40 year old male never smoker followed in our office for sarcoidosis, asthma  PMH: Myelitis, lymphadenopathy, neurosarcoidosis Smoker/ Smoking History: Never smoker Maintenance:  Rituxan  Pt of: Dr. Chase Caller  11/08/2020  - Visit   40 year old male never smoker followed in our office for sarcoidosis/asthma.  Patient was last seen by Dr. Chase Caller in November/2021.  Plan of care at that office visit was to follow COVID-19 precautions, follow-up in 12 months, could consider CT of chest if sensation of chest pressure continues.  Patient contacted our office on 11/07/2020.  Reporting a popping sensation going on for months.  No chest pain, orthopnea or hemoptysis or fever.  Patient is also followed by rheumatology as well as neurology for neurosarcoidosis.  Last seen by [facility] rheumatology Dr. Candis Schatz in October/2021.  Excerpt of that assessment and plan as listed below:  Gregory Fuller is a 40 y.o. male with a past medical history of asymptomatic biopsy-proven pulmonary sarcoidosis and symptomatic brain and spinal CNS lesions concerning for Neurosarcoidosis. Neurology has been managing immunosuppression for his CNS lesions, has been on Ritux q52mo since 2016.  Pulmonary Sarcoid w/ Probable Neurosarcoid: Historically asymptomatic from a pulmonary standpoint and continues to be so (last CT chest 06/2019 showed mild hilar adenopathy without parenchymal lesions). Was placed on a pred for recent pain (neuropathic in character), however follow up MR imaging (MR brain, C spine, T spine - 07/07/20) showed no new lesions with stability of prior posterior T5-7 and C5-T1/2 lesions and of scattered foci in subcortical and peri-ventricular white matter. Previously noted cerebral peduncle lesion was resolved. Prednisone is being weaned. He has been maintained on  Ritux q99mo since 2016. Remains without other organ involvement (see discussion of chest discomfort below). - Ritux q3mo, next due in Nov 2021 - Pred down to 10mg  this Thursday and then waiting further instructions from neuro. Agree with continued taper. - CBC w/ diff, CMP, CD19 counts reviewed and appropriate - Discussing COVID booster with neuro, potentially on 10/23 (following CD19 cell count). Given he is unlikely to have a return of his CD19 cells, may be worthwhile to give booster prior to this next infusion. - Flu shot today - Agree with DEXA scan, vit D wnl (07/28/20)  Chest Pain  Intermittent Upper Abd Fullness: Concomitant symptoms associated with oral intake suggests GI etiology. Currently intermittent and mild in severity. Symptoms not persistent to suggest dysphagia. Pain described as "uncomfortable" instead of true pain argues against esophageal spasm. As he is currently on steroids, wonder if he has some mild gastritis/GERD. Will work to wean pred as above and continue and continue H2 blocker for now. Given return precautions and red-flag symptoms. - Wean pred - Cont famotidine  Return in about 6 months (around 02/06/2021).  Patient was seen and discussed with attending physician, Dr. Nelson Chimes, MD  Rheumatology Fellow, PGY4   Patient reports regular follow-up with [facility] neurology and [facility] rheumatology.  [facility] neurology is managing patient's Rituxan infusions every 6 months.  Patient presenting to our office today because he started to have a "popping sensation" in his chest.  He also has some chest discomfort.  This is been a chronic symptom of chest discomfort.  Previous specialist recommendations are for management of acid reflux.  Patient was told by Dr. Chase Caller that if popping sensation  should return he needs to contact our office so that we can order chest imaging.  Last CT chest was in 2021 by [facility].   Questionaires / Pulmonary Flowsheets:   ACT:  No  flowsheet data found.  MMRC: No flowsheet data found.  Epworth:  No flowsheet data found.  Tests:   08/22/2016-CT chest with contrast-persistent borderline to mildly enlarged right hilar lymph nodes, similar to prior exams, is presumably related to underlying sarcoidosis, no acute findings of the thorax  05/12/2018-cardiopulmonary exercise test-exercise testing and gas exchange demonstrates normal function capacity when compared to match sedentary norms  FENO:  No results found for: NITRICOXIDE  PFT: PFT Results Latest Ref Rng & Units 08/27/2014  FVC-Pre L 3.79  FVC-Predicted Pre % 96  FVC-Post L 3.97  FVC-Predicted Post % 100  Pre FEV1/FVC % % 89  Post FEV1/FCV % % 90  FEV1-Pre L 3.38  FEV1-Predicted Pre % 102  FEV1-Post L 3.59  DLCO uncorrected ml/min/mmHg 28.08  DLCO [facility]% % 104  DLCO corrected ml/min/mmHg 28.16  DLCO COR %Predicted % 104  DLVA Predicted % 124  TLC L 5.67  TLC % Predicted % 92  RV % Predicted % 128    WALK:  No flowsheet data found.  Imaging: No results found.  Lab Results:  CBC    Component Value Date/Time   WBC 6.7 04/08/2019 1932   RBC 5.32 04/08/2019 1932   HGB 15.1 04/08/2019 1932   HCT 43.9 04/08/2019 1932   PLT 225 04/08/2019 1932   MCV 82.5 04/08/2019 1932   MCH 28.4 04/08/2019 1932   MCHC 34.4 04/08/2019 1932   RDW 13.8 04/08/2019 1932   LYMPHSABS 1.6 09/24/2018 0816   MONOABS 0.4 09/24/2018 0816   EOSABS 0.1 09/24/2018 0816   BASOSABS 0.0 09/24/2018 0816    BMET    Component Value Date/Time   NA 138 11/08/2020 1416   K 4.0 11/08/2020 1416   CL 104 11/08/2020 1416   CO2 28 11/08/2020 1416   GLUCOSE 103 (H) 11/08/2020 1416   BUN 13 11/08/2020 1416   CREATININE 1.07 11/08/2020 1416   CALCIUM 9.4 11/08/2020 1416   GFRNONAA >60 04/08/2019 1932   GFRAA >60 04/08/2019 1932    BNP No results found for: BNP  ProBNP No results found for: PROBNP  Specialty Problems      Pulmonary Problems   Asthma, chronic    Pleural nodule   Solitary pulmonary nodule   Dyspnea   Hilar adenopathy      No Known Allergies  Immunization History  Administered Date(s) Administered  . Influenza Split 08/06/2015, 07/16/2017, 08/04/2018, 08/06/2019, 08/08/2020  . Influenza,inj,Quad PF,6+ Mos 08/09/2014, 07/06/2016, 07/16/2017, 08/06/2019, 08/08/2020  . Influenza-Unspecified 08/09/2014, 08/04/2018, 08/06/2019  . PFIZER SARS-COV-2 Vaccination 01/15/2020, 02/08/2020  . Pneumococcal Conjugate-13 11/12/2014  . Pneumococcal Polysaccharide-23 03/07/2015  . Pneumococcal-Unspecified 03/07/2015  . Tdap 11/15/2016    Past Medical History:  Diagnosis Date  . Asthma   . Bronchitis   . GERD (gastroesophageal reflux disease)   . Sarcoidosis   . Subacute transverse myelitis (HCC)     Tobacco History: Social History   Tobacco Use  Smoking Status Never Smoker  Smokeless Tobacco Never Used  Tobacco Comment   pt states he smoked socially   Counseling given: Not Answered Comment: pt states he smoked socially   Continue to not smoke  Outpatient Encounter Medications as of 11/08/2020  Medication Sig  . albuterol (PROVENTIL HFA) 108 (90 Base) MCG/ACT inhaler INHALE 2 PUFFS EVERY 6  HOURS AS NEEDED FOR WHEEZING OR SHORTNESS OF BREATH  . Cholecalciferol (VITAMIN D3) 3000 units TABS Take 4,000 Units by mouth daily.  Marland Kitchen docusate sodium (COLACE) 100 MG capsule Take 100 mg by mouth daily as needed for mild constipation. Reported on 04/10/2016  . famotidine (PEPCID) 20 MG tablet Take by mouth.  . fluticasone (FLONASE) 50 MCG/ACT nasal spray Place into the nose.  . gabapentin (NEURONTIN) 400 MG capsule Take 2 capsules by mouth 3 (three) times daily.  Marland Kitchen ibuprofen (ADVIL,MOTRIN) 200 MG tablet Take 200 mg by mouth as needed for moderate pain.   . pantoprazole (PROTONIX) 40 MG tablet Take 1 tablet (40 mg total) by mouth daily.  . predniSONE (DELTASONE) 10 MG tablet Take 3 pills/day (30mg ) for one month,then 20 mg/day for one month,  then 15 mg/day until follow-up. After breakfast.  . RiTUXimab (RITUXAN IV) Inject 1 each into the vein every 6 (six) months.   . sulfamethoxazole-trimethoprim (BACTRIM,SEPTRA) 400-80 MG tablet Take 1 tablet by mouth daily.   No facility-administered encounter medications on file as of 11/08/2020.     Review of Systems  Review of Systems  Constitutional: Negative for activity change, chills, fatigue, fever and unexpected weight change.  HENT: Negative for postnasal drip, rhinorrhea, sinus pressure, sinus pain and sore throat.   Eyes: Negative.   Respiratory: Positive for shortness of breath. Negative for cough and wheezing.        "popping" in chest when laying flat - started back last weekend  Cardiovascular: Positive for chest pain (Atypical chest pain, popping sensation, occasional chest discomfort). Negative for palpitations.  Gastrointestinal: Negative for constipation, diarrhea, nausea and vomiting.       Atypical abdominal fullness / discomfort   Endocrine: Negative.   Genitourinary: Negative.   Musculoskeletal: Negative.   Skin: Negative.   Neurological: Negative for dizziness and headaches.  Psychiatric/Behavioral: Negative.  Negative for dysphoric mood. The patient is not nervous/anxious.   All other systems reviewed and are negative.    Physical Exam  BP 122/72 (BP Location: Left Arm, Cuff Size: Normal)   Pulse 90   Temp (!) 97.1 F (36.2 C) (Oral)   Ht 5' 6.5" (1.689 m)   Wt 202 lb (91.6 kg)   SpO2 100%   BMI 32.12 kg/m   Wt Readings from Last 5 Encounters:  11/08/20 202 lb (91.6 kg)  09/27/20 207 lb 12.8 oz (94.3 kg)  04/21/19 184 lb 9.6 oz (83.7 kg)  04/08/19 185 lb (83.9 kg)  10/31/18 191 lb 8 oz (86.9 kg)    BMI Readings from Last 5 Encounters:  11/08/20 32.12 kg/m  09/27/20 33.54 kg/m  04/21/19 29.80 kg/m  04/08/19 29.86 kg/m  10/31/18 32.36 kg/m     Physical Exam Vitals and nursing note reviewed.  Constitutional:      General: He is not  in acute distress.    Appearance: Normal appearance. He is normal weight.  HENT:     Head: Normocephalic and atraumatic.     Right Ear: Hearing and external ear normal.     Left Ear: Hearing and external ear normal.     Nose: Nose normal. No mucosal edema or rhinorrhea.     Right Turbinates: Not enlarged.     Left Turbinates: Not enlarged.     Mouth/Throat:     Mouth: Mucous membranes are dry.     Pharynx: Oropharynx is clear. No oropharyngeal exudate.  Eyes:     Pupils: Pupils are equal, round, and reactive to light.  Cardiovascular:     Rate and Rhythm: Normal rate and regular rhythm.     Pulses: Normal pulses.     Heart sounds: Normal heart sounds. No murmur heard.   Pulmonary:     Effort: Pulmonary effort is normal.     Breath sounds: Normal breath sounds. No decreased breath sounds, wheezing or rales.  Musculoskeletal:     Cervical back: Normal range of motion.     Right lower leg: No edema.     Left lower leg: No edema.  Lymphadenopathy:     Cervical: No cervical adenopathy.  Skin:    General: Skin is warm and dry.     Capillary Refill: Capillary refill takes less than 2 seconds.     Findings: No erythema or rash.  Neurological:     General: No focal deficit present.     Mental Status: He is alert and oriented to person, place, and time.     Motor: No weakness.     Coordination: Coordination normal.     Gait: Gait is intact. Gait normal.  Psychiatric:        Mood and Affect: Mood normal.        Behavior: Behavior normal. Behavior is cooperative.        Thought Content: Thought content normal.        Judgment: Judgment normal.       Assessment & Plan:   Neurosarcoidosis in adult Plan: Continue follow-up with [facility] neuro Continue Rituxan infusions  Abnormal findings on diagnostic imaging of lung History of stigmata of sarcoidosis of the lung Previously evaluated by Dr. Chase Caller, patient had inspiratory "popping" sound/sensation.  This originally went away.   Came back about a week ago.  Previous recommendations from Dr. Chase Caller were to order a CT chest if this did return.  Discussed this with patient today.  Patient would like to proceed forward with this.  Plan: We will order CT chest with contrast today to further evaluate popping sensation with inspiration when laying flat  Chest pain History of atypical chest pain Patient reporting this atypical popping sensation/chest wall pain upon inspiration when laying down Benign physical exam today  Plan: Proceed forward with CT chest with contrast Lab work today If CT with contrast is stable, need to consider cardiology referral/echocardiogram  Sarcoidosis Plan: Continue Rituxan infusions CT chest with contrast ordered today Lab work May need to consider echocardiogram/cardiology referral in the future  Sensation of chest pressure Unsure what to make of patient's sensation of chest pressure.  This is happened in the past.  When last evaluated by Dr. Chase Caller it was felt that patient would benefit from a CT if this happened again.  We will proceed forward with CT chest with contrast given known history of sarcoidosis as well as chest wall discomfort with inspiration with popping sensation.  If CT chest with contrast is stable.  Will need to consider referral to cardiology for further evaluation and work-up.  Patient also would likely benefit from an echocardiogram.    Return in about 4 weeks (around 12/06/2020), or if symptoms worsen or fail to improve, for Follow up with Dr. Purnell Shoemaker.   Lauraine Rinne, NP 11/08/2020   This appointment required 42 minutes of patient care (this includes precharting, chart review, review of results, face-to-face care, etc.).

## 2020-11-09 ENCOUNTER — Other Ambulatory Visit: Payer: Self-pay

## 2020-11-09 ENCOUNTER — Ambulatory Visit (INDEPENDENT_AMBULATORY_CARE_PROVIDER_SITE_OTHER)
Admission: RE | Admit: 2020-11-09 | Discharge: 2020-11-09 | Disposition: A | Discharge status: Home / Self Care | Payer: BC Managed Care – PPO | Source: Ambulatory Visit | Attending: Pulmonary Disease | Admitting: Pulmonary Disease

## 2020-11-09 ENCOUNTER — Encounter: Payer: Self-pay | Admitting: *Deleted

## 2020-11-09 DIAGNOSIS — D869 Sarcoidosis, unspecified: Secondary | ICD-10-CM | POA: Diagnosis not present

## 2020-11-09 DIAGNOSIS — R079 Chest pain, unspecified: Secondary | ICD-10-CM | POA: Diagnosis not present

## 2020-11-09 DIAGNOSIS — R0602 Shortness of breath: Secondary | ICD-10-CM | POA: Diagnosis not present

## 2020-11-09 DIAGNOSIS — R590 Localized enlarged lymph nodes: Secondary | ICD-10-CM | POA: Diagnosis not present

## 2020-11-09 MED ORDER — IOHEXOL 300 MG/ML  SOLN
80.0000 mL | Freq: Once | INTRAMUSCULAR | Status: AC | PRN
  Administered 2020-11-09: 80 mL via INTRAVENOUS

## 2020-11-10 ENCOUNTER — Other Ambulatory Visit: Payer: 59

## 2020-11-10 ENCOUNTER — Other Ambulatory Visit: Payer: Self-pay | Admitting: Pulmonary Disease

## 2020-11-10 ENCOUNTER — Inpatient Hospital Stay: Admission: RE | Admit: 2020-11-10 | Payer: 59 | Source: Ambulatory Visit

## 2020-11-10 DIAGNOSIS — R079 Chest pain, unspecified: Secondary | ICD-10-CM

## 2020-11-11 NOTE — Telephone Encounter (Signed)
Patient was exposed to someone who tested positive. Person was tested on Tuesday and got results back today. Patient was around person yesterday. When should he get tested?  Or does he just isolate for 5-10 days to see is symptoms start to develop. Patient concerned as Dr. Chase Caller told patient he needed to call us if he was exposed as he is immunocompromised.   Dr. Chase Caller is out of town Aaron Edelman can you please advise.

## 2020-11-11 NOTE — Telephone Encounter (Signed)
11/11/2020  Is the patient symptomatic?  If the patient is asymptomatic then he should isolate and should not test for at least the next 5 days from exposure.  If the patient is symptomatic such as fever, shortness of breath, fatigue, runny nose, diarrhea then he should obtain a COVID-19 test.  If patient does test positive and is symptomatic would recommend monoclonal antibody infusion.  This will be a referral to the [facility] health system.  I would also recommend the patient contact his doctors at [facility] to see if they can coordinate monoclonal antibody infusion through their practice.  He would only need 1 dose.  Unfortunately this is been a difficult task to coordinate so would be best to try to accomplish this through both health systems.  Wyn Quaker, FNP

## 2020-11-14 ENCOUNTER — Telehealth: Payer: Self-pay | Admitting: Internal Medicine

## 2020-11-14 NOTE — Telephone Encounter (Signed)
Because he is a vacccine non-responder and high risk patient -> he should now be referred to the infusion clinic for consideration of new IM injection EVUSHELD that can give him antibodies against covid for many months. It is FDA approved under a EUA

## 2020-11-14 NOTE — Telephone Encounter (Signed)
E mail sent to infusion clinic with Dr. Golden Pop request. Patient called and made aware of Dr. Golden Pop recommendations.  Patient stated his Covid PCR test should be back this afternoon. At this time, Patient does not have symptoms of Covid and he has self isolated since exposure.

## 2020-11-15 ENCOUNTER — Other Ambulatory Visit: Payer: BC Managed Care – PPO

## 2020-11-15 DIAGNOSIS — Z03818 Encounter for observation for suspected exposure to other biological agents ruled out: Secondary | ICD-10-CM | POA: Diagnosis not present

## 2020-11-16 ENCOUNTER — Ambulatory Visit: Payer: BC Managed Care – PPO | Admitting: Internal Medicine

## 2020-11-17 ENCOUNTER — Ambulatory Visit (INDEPENDENT_AMBULATORY_CARE_PROVIDER_SITE_OTHER): Payer: BC Managed Care – PPO | Admitting: Internal Medicine

## 2020-11-17 ENCOUNTER — Encounter: Payer: Self-pay | Admitting: Internal Medicine

## 2020-11-17 ENCOUNTER — Other Ambulatory Visit: Payer: Self-pay

## 2020-11-17 VITALS — BP 106/70 | HR 53 | Ht 66.0 in | Wt 203.4 lb

## 2020-11-17 DIAGNOSIS — R079 Chest pain, unspecified: Secondary | ICD-10-CM

## 2020-11-17 DIAGNOSIS — D869 Sarcoidosis, unspecified: Secondary | ICD-10-CM | POA: Diagnosis not present

## 2020-11-17 NOTE — Patient Instructions (Signed)
Medication Instructions:  *If you need a refill on your cardiac medications before your next appointment, please call your pharmacy*  Testing/Procedures: Your physician has requested that you have an echocardiogram. Echocardiography is a painless test that uses sound waves to create images of your heart. It provides your doctor with information about the size and shape of your heart and how well your heart's chambers and valves are working. This procedure takes approximately one hour. There are no restrictions for this procedure.  Follow-Up: At [facility], you and your health needs are our priority.  As part of our continuing mission to provide you with exceptional heart care, we have created designated Provider Care Teams.  These Care Teams include your primary Cardiologist (physician) and Advanced Practice Providers (APPs -  Physician Assistants and Nurse Practitioners) who all work together to provide you with the care you need, when you need it.  We recommend signing up for the patient portal called "MyChart".  Sign up information is provided on this After Visit Summary.  MyChart is used to connect with patients for Virtual Visits (Telemedicine).  Patients are able to view lab/test results, encounter notes, upcoming appointments, etc.  Non-urgent messages can be sent to your provider as well.   To learn more about what you can do with MyChart, go to [email or web address].    Your next appointment:   Your physician recommends that you schedule a follow-up appointment in: 3 MONTHS with Dr. Gasper Sells.   The format for your next appointment:   In Person with Rudean Haskell, MD

## 2020-11-17 NOTE — Progress Notes (Signed)
Cardiology Office Note:    Date:  11/17/2020   ID:  Gregory Fuller, DOB 1981-01-26, MRN [id number]  PCP:  Gregory Shan, MD  [place] Cardiologist:  No primary care provider on file.  [facility] Electrophysiologist:  None   CC: chest poppping Consulted for the evaluation of chest pain at the behest of Gregory Shan, MD  History of Present Illness:    Gregory Fuller is a 40 y.o. male with a hx of sarcoidosis (neurosarcoidosis) who presents with chest pain.  Patient notes that he is feeling chest popping.  Feels sternal pain and burning that feels worsening with prednisone.  No radiation.  Discomfort occurs with at night predominantely, worsens with wine or having a fun night, and improves with laying down.  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  No shortness of breath .  No PND or orthopnea.  No bendopnea, weight gain, leg swelling , or abdominal swelling.  No syncope or near syncope.  Notes  no palpitations or funny heart beats, except for one time yesterday in the setting of waiting for a COVID-19 test, improved after getting the results.  Patient reports NO prior cardiac testing including  echo,  stress test,  heart catheterizations,  cardioversion,  ablations.  No history of prematurity.    Past Medical History:  Diagnosis Date  . Asthma   . Bronchitis   . GERD (gastroesophageal reflux disease)   . Sarcoidosis   . Subacute transverse myelitis [facility])     Past Surgical History:  Procedure Laterality Date  . LYMPH NODE BIOPSY N/A 10/13/2014   Procedure: LYMPH NODE BIOPSY;  Surgeon: Melrose Nakayama, MD;  Location: [place];  Service: Thoracic;  Laterality: N/A;  . MEDIASTINOSCOPY N/A 10/13/2014   Procedure: MEDIASTINOSCOPY;  Surgeon: Melrose Nakayama, MD;  Location: [place];  Service: Thoracic;  Laterality: N/A;  . NO PAST SURGERIES    . VIDEO BRONCHOSCOPY WITH ENDOBRONCHIAL ULTRASOUND  N/A 09/08/2014   Procedure: VIDEO BRONCHOSCOPY WITH ENDOBRONCHIAL ULTRASOUND;  Surgeon: Brand Males, MD;  [facility];  Service: Thoracic;  Laterality: N/A;    Current Medications: Current Meds  Medication Sig  . albuterol (PROVENTIL HFA) 108 (90 Base) MCG/ACT inhaler INHALE 2 PUFFS EVERY 6 HOURS AS NEEDED FOR WHEEZING OR SHORTNESS OF BREATH  . Cholecalciferol (VITAMIN D3) 3000 units TABS Take 4,000 Units by mouth daily.  Marland Kitchen docusate sodium (COLACE) 100 MG capsule Take 100 mg by mouth daily as needed for mild constipation. Reported on 04/10/2016  . famotidine (PEPCID) 20 MG tablet Take by mouth.  . fluticasone (FLONASE) 50 MCG/ACT nasal spray Place into the nose.  . gabapentin (NEURONTIN) 400 MG capsule Take 2 capsules by mouth 3 (three) times daily.  Marland Kitchen ibuprofen (ADVIL,MOTRIN) 200 MG tablet Take 200 mg by mouth as needed for moderate pain.   Marland Kitchen MAXITROL 3.5-10000-0.1 ophthalmic suspension Apply 1 drop to eye 4 (four) times daily.  . pantoprazole (PROTONIX) 40 MG tablet Take 1 tablet (40 mg total) by mouth daily.  . predniSONE (DELTASONE) 10 MG tablet Take 3 pills/day (30mg ) for one month,then 20 mg/day for one month, then 15 mg/day until follow-up. After breakfast.  . RiTUXimab (RITUXAN IV) Inject 1 each into the vein every 6 (six) months.      Allergies:   Patient has no known allergies.   Social History   Socioeconomic History  . Marital status: Married  Spouse name: Not on file  . Number of children: 0  . Years of education: 38  . Highest education level: Not on file  Occupational History  . Occupation: unemployed  Tobacco Use  . Smoking status: Never Smoker  . Smokeless tobacco: Never Used  . Tobacco comment: pt states he smoked socially  Substance and Sexual Activity  . Alcohol use: Yes    Comment: occassional  . Drug use: No    Comment: used to smoke marijuana  . Sexual activity: Not on file  Other Topics Concern  . Not on file  Social History Narrative  .  Not on file   Social Determinants of Health   Financial Resource Strain: Not on file  Food Insecurity: Not on file  Transportation Needs: Not on file  Physical Activity: Not on file  Stress: Not on file  Social Connections: Not on file     Family History: The patient's family history includes Diabetes in his maternal grandmother; Seizures in his father and maternal grandfather; Thyroid disease in his mother.  History of coronary artery disease notable for no members. History of heart failure notable for no members. No history of cardiomyopathies including hypertrophic cardiomyopathy, left ventricular non-compaction, or arrhythmogenic right ventricular cardiomyopathy. History of arrhythmia notable for no members. Denies family history of sudden cardiac death including drowning, car accidents, or unexplained deaths in the family. No history of bicuspid aortic valve or aortic aneurysm or dissection.  ROS:   Please see the history of present illness.     All other systems reviewed and are negative.  EKGs/Labs/Other Studies Reviewed:    The following studies were reviewed today:  EKG:   11/17/2020 Sinus Bradycardia rate 53 nonspecific (III) 04/10/2019 Sinus Rhythm rate 74 WNL  PET Scan: Date: 08/27/2014 Results: IMPRESSION:  1. Cluster of hypermetabolic right infrahilar lymph nodes. This  activity is greater than reactive adenopathy. Consider granulomatous  disease versus malignancy including lymphoma. Atypical pattern for  both.  2. Small mildly metabolic nodule along the pleural surface of the  right lower lobe favors a small site of infection or inflammation.  3. Long segments of abnormal metabolic activity involving the spinal  cord previous of cervical spine and thoracic spine. This corresponds  to the abnormal dural enhancement on comparison MRI . Presumably  this relates to the hilar adenopathy which would favor granulomas  disease or lymphoma rather than a primary CNS  process).   NonCardiac CT: Date:11/09/2020 Results: No coronary or aortic calcium  Recent Labs: 11/08/2020: ALT 13; BUN 13; Creatinine, Ser 1.07; Potassium 4.0; Sodium 138  Recent Lipid Panel No results found for: CHOL, TRIG, HDL, CHOLHDL, VLDL, LDLCALC, LDLDIRECT   Risk Assessment/Calculations:     N/A  Physical Exam:    VS:  BP 106/70   Pulse (!) 53   Ht 5\' 6"  (1.676 m)   Wt 203 lb 6.4 oz (92.3 kg)   SpO2 99%   BMI 32.83 kg/m     Wt Readings from Last 3 Encounters:  11/17/20 203 lb 6.4 oz (92.3 kg)  11/08/20 202 lb (91.6 kg)  09/27/20 207 lb 12.8 oz (94.3 kg)     GEN:  Well nourished, well developed in no acute distress HEENT: Normal NECK: No JVD; No carotid bruits LYMPHATICS: No lymphadenopathy CARDIAC: RRR, no murmurs, rubs, gallops RESPIRATORY:  Clear to auscultation without rales, wheezing or rhonchi  ABDOMEN: Soft, non-tender, non-distended MUSCULOSKELETAL:  No edema; No deformity  SKIN: Warm and dry NEUROLOGIC:  Alert and  oriented x 3 PSYCHIATRIC:  Normal affect   ASSESSMENT:    1. Sarcoidosis   2. Chest pain, unspecified type    PLAN:    In order of problems listed above:  Chest Pain - consistent with acid reflux with non cardiac symptoms - continue PPI and Pepcid   Cardiac Sarcoidosis Eval - With extracardiac sarcoidosis (lung, brain) - with prior lymph node biopsy - without palpitations , presyncope, or syncope. - without  complete left or right bundle branch block; presence of unexplained pathologic Q waves in two or more leads; sustained first-, second-, or third-degree AV block - will assess echocardiogram  Three month follow up unless new symptoms or abnormal test results warranting change in plan  Would be reasonable for  Virtual Follow up  Would be reasonable for  APP Follow up       Medication Adjustments/Labs and Tests Ordered: Current medicines are reviewed at length with the patient today.  Concerns regarding medicines are  outlined above.  Orders Placed This Encounter  Procedures  . EKG 12-Lead  . ECHOCARDIOGRAM COMPLETE   No orders of the defined types were placed in this encounter.   Patient Instructions  Medication Instructions:  *If you need a refill on your cardiac medications before your next appointment, please call your pharmacy*  Testing/Procedures: Your physician has requested that you have an echocardiogram. Echocardiography is a painless test that uses sound waves to create images of your heart. It provides your doctor with information about the size and shape of your heart and how well your heart's chambers and valves are working. This procedure takes approximately one hour. There are no restrictions for this procedure.  Follow-Up: At [facility], you and your health needs are our priority.  As part of our continuing mission to provide you with exceptional heart care, we have created designated Provider Care Teams.  These Care Teams include your primary Cardiologist (physician) and Advanced Practice Providers (APPs -  Physician Assistants and Nurse Practitioners) who all work together to provide you with the care you need, when you need it.  We recommend signing up for the patient portal called "MyChart".  Sign up information is provided on this After Visit Summary.  MyChart is used to connect with patients for Virtual Visits (Telemedicine).  Patients are able to view lab/test results, encounter notes, upcoming appointments, etc.  Non-urgent messages can be sent to your provider as well.   To learn more about what you can do with MyChart, go to [email or web address].    Your next appointment:   Your physician recommends that you schedule a follow-up appointment in: 3 MONTHS with Dr. Gasper Sells.   The format for your next appointment:   In Person with Rudean Haskell, MD        Signed, Werner Lean, MD  11/17/2020 9:58 AM    [place]

## 2020-11-23 MED FILL — GABAPENTIN 400 MG CAPSULE: 400 | 30 days supply | Qty: 210 | Fill #2

## 2020-11-24 ENCOUNTER — Other Ambulatory Visit (HOSPITAL_COMMUNITY): Payer: Self-pay | Admitting: Psychiatry

## 2020-11-25 MED FILL — ALPRAZolam 0.25 MG TABS: 0.25 | 30 days supply | Qty: 30 | Fill #0

## 2020-12-07 ENCOUNTER — Ambulatory Visit (HOSPITAL_COMMUNITY): Payer: BC Managed Care – PPO | Attending: Cardiology

## 2020-12-07 ENCOUNTER — Other Ambulatory Visit: Payer: Self-pay

## 2020-12-07 DIAGNOSIS — D869 Sarcoidosis, unspecified: Secondary | ICD-10-CM | POA: Insufficient documentation

## 2020-12-07 DIAGNOSIS — R079 Chest pain, unspecified: Secondary | ICD-10-CM

## 2020-12-07 LAB — ECHOCARDIOGRAM COMPLETE
Area-P 1/2: 2.36 cm2
S' Lateral: 2.8 cm

## 2020-12-15 ENCOUNTER — Encounter: Payer: Self-pay | Admitting: Gastroenterology

## 2020-12-22 ENCOUNTER — Ambulatory Visit (INDEPENDENT_AMBULATORY_CARE_PROVIDER_SITE_OTHER): Payer: BC Managed Care – PPO | Admitting: Internal Medicine

## 2020-12-22 ENCOUNTER — Encounter: Payer: Self-pay | Admitting: Internal Medicine

## 2020-12-22 ENCOUNTER — Other Ambulatory Visit: Payer: Self-pay

## 2020-12-22 VITALS — BP 122/82 | HR 52 | Temp 97.6°F | Ht 66.0 in | Wt 204.4 lb

## 2020-12-22 DIAGNOSIS — Z9225 Personal history of immunosupression therapy: Secondary | ICD-10-CM

## 2020-12-22 DIAGNOSIS — R0789 Other chest pain: Secondary | ICD-10-CM

## 2020-12-22 DIAGNOSIS — Z7185 Encounter for immunization safety counseling: Secondary | ICD-10-CM

## 2020-12-22 NOTE — Progress Notes (Signed)
OV 12/22/2020  Subjective:  Patient ID: Gregory Fuller, male , DOB: Sep 19, 1981 , age 40 y.o. , MRN: [id number] , ADDRESS: [place] [address] PCP Delilah Shan, MD Patient Care Team: Delilah Shan, MD as PCP - General (Family Medicine) Brand Males, MD as Consulting Physician (Pulmonary Disease) Carolan Shiver, MD as Consulting Physician (Rheumatology)  This Provider for this visit: Treatment Team:  Attending Provider: Brand Males, MD    12/22/2020 -   Chief Complaint  Patient presents with   Follow-up    Doing better     HPI Gregory Fuller 40 y.o. -neurosarcoidosis of the spine.  Presents to pulmonary clinic for follow-up because he is always had some kind of a chest pressure.  Most recently when seen in November 2020 when he had more chest pressure.  He then followed up with nurse practitioner in December.  Chest pressure persisted and then we got a CT scan of the chest and that is fairly clear he has this mild mediastinal adenopathy.  He is supposed to go to [place] around new year Christmas time but we checked IgG after his first 2 vaccine shots and he had no IgG.  Therefore he spent this time in [place] for the holidays.  Currently asymptomatic.  Doing well no respiratory complaints.  He is noted his Covid booster but he does not know his IgG status.    CT Chest data  No results found.    PFT  PFT Results Latest Ref Rng & Units 08/27/2014  FVC-Pre L 3.79  FVC-Predicted Pre % 96  FVC-Post L 3.97  FVC-Predicted Post % 100  Pre FEV1/FVC % % 89  Post FEV1/FCV % % 90  FEV1-Pre L 3.38  FEV1-Predicted Pre % 102  FEV1-Post L 3.59  DLCO uncorrected ml/min/mmHg 28.08  DLCO UNC% % 104  DLCO corrected ml/min/mmHg 28.16  DLCO COR %Predicted % 104  DLVA Predicted % 124  TLC L 5.67  TLC % Predicted % 92  RV % Predicted % 128       has a past medical history of Asthma, Bronchitis, GERD (gastroesophageal reflux disease),  Sarcoidosis, and Subacute transverse myelitis ([place]).   reports that he has never smoked. He has never used smokeless tobacco.  Past Surgical History:  Procedure Laterality Date   LYMPH NODE BIOPSY N/A 10/13/2014   Procedure: LYMPH NODE BIOPSY;  Surgeon: Melrose Nakayama, MD;  Location: [place];  Service: Thoracic;  Laterality: N/A;   MEDIASTINOSCOPY N/A 10/13/2014   Procedure: MEDIASTINOSCOPY;  Surgeon: Melrose Nakayama, MD;  Location: [place];  Service: Thoracic;  Laterality: N/A;   NO PAST SURGERIES     VIDEO BRONCHOSCOPY WITH ENDOBRONCHIAL ULTRASOUND N/A 09/08/2014   Procedure: VIDEO BRONCHOSCOPY WITH ENDOBRONCHIAL ULTRASOUND;  Surgeon: Brand Males, MD;  Location: [place];  Service: Thoracic;  Laterality: N/A;    No Known Allergies  Immunization History  Administered Date(s) Administered   Influenza Split 08/06/2015, 07/16/2017, 08/04/2018, 08/06/2019, 08/08/2020   Influenza,inj,Quad PF,6+ Mos 08/09/2014, 07/06/2016, 07/16/2017, 08/06/2019, 08/08/2020   Influenza-Unspecified 08/09/2014, 08/04/2018, 08/06/2019   PFIZER(Purple Top)SARS-COV-2 Vaccination 01/15/2020, 02/08/2020   Pneumococcal Conjugate-13 11/12/2014   Pneumococcal Polysaccharide-23 03/07/2015   Pneumococcal-Unspecified 03/07/2015   Tdap 11/15/2016    Family History  Problem Relation Age of Onset   Thyroid disease Mother    Seizures Father    Diabetes Maternal Grandmother    Seizures Maternal Grandfather      Current Outpatient Medications:  albuterol (PROVENTIL HFA) 108 (90 Base) MCG/ACT inhaler, INHALE 2 PUFFS EVERY 6 HOURS AS NEEDED FOR WHEEZING OR SHORTNESS OF BREATH, Disp: 18 g, Rfl: 5   Cholecalciferol (VITAMIN D3) 3000 units TABS, Take 4,000 Units by mouth daily., Disp: , Rfl:    docusate sodium (COLACE) 100 MG capsule, Take 100 mg by mouth daily as needed for mild constipation. Reported on 04/10/2016, Disp: , Rfl:    famotidine (PEPCID) 20 MG tablet, Take by mouth., Disp: , Rfl:     fluticasone (FLONASE) 50 MCG/ACT nasal spray, Place into the nose., Disp: , Rfl:    gabapentin (NEURONTIN) 400 MG capsule, Take 2 capsules by mouth 3 (three) times daily., Disp: , Rfl:    ibuprofen (ADVIL,MOTRIN) 200 MG tablet, Take 200 mg by mouth as needed for moderate pain. , Disp: , Rfl:    MAXITROL 3.5-10000-0.1 ophthalmic suspension, Apply 1 drop to eye 4 (four) times daily., Disp: , Rfl:    pantoprazole (PROTONIX) 40 MG tablet, Take 1 tablet (40 mg total) by mouth daily., Disp: 90 tablet, Rfl: 1   predniSONE (DELTASONE) 10 MG tablet, Take 3 pills/day (30mg ) for one month,then 20 mg/day for one month, then 15 mg/day until follow-up. After breakfast., Disp: , Rfl:    RiTUXimab (RITUXAN IV), Inject 1 each into the vein every 6 (six) months. , Disp: , Rfl:       Objective:   Vitals:   12/22/20 1444  BP: 122/82  Pulse: (!) 52  Temp: 97.6 F (36.4 C)  TempSrc: Oral  SpO2: 100%  Weight: 204 lb 6.4 oz (92.7 kg)  Height: 5\' 6"  (1.676 m)    Estimated body mass index is 32.99 kg/m as calculated from the following:   Height as of this encounter: 5\' 6"  (1.676 m).   Weight as of this encounter: 204 lb 6.4 oz (92.7 kg).  @WEIGHTCHANGE @  [employer]   12/22/20 1444  Weight: 204 lb 6.4 oz (92.7 kg)     Physical Exam   General: No distress. Looks well Neuro: Alert and Oriented x 3. GCS 15. Speech normal Psych: Pleasant Resp:  Barrel Chest - no.  Wheeze - no, Crackles - no, No overt respiratory distress CVS: Normal heart sounds. Murmurs - no Ext: Stigmata of Connective Tissue Disease - no HEENT: Normal upper airway. PEERL +. No post nasal drip        Assessment:       ICD-10-CM   1. Vaccine counseling  Z71.85 SAR CoV2 Serology (COVID 19)AB(IGG)IA  2. Increased infection risk status post immunosuppressive therapy  Z92.25 SAR CoV2 Serology (COVID 19)AB(IGG)IA  3. Sensation of chest pressure  R07.89        Plan:     Patient Instructions     ICD-10-CM    1. Vaccine counseling  Z71.85   2. Increased infection risk status post immunosuppressive therapy  Z92.25   3. Sensation of chest pressure  R07.89      1. Vaccine counseling 2.  Immunosuppression due to drug therapy  Today, we discussed that there is a good chance your body did not mount antibodies to the COVID-19 vaccine despite booster due to Rituximab.     Plan:  - Order IgG COVID-19 serology  - Follow COVID-19 precautions  -If there is no IgG response to Covid then we will refer you to monoclonal antibody prophylaxis EVUSHELD  3. Sensation of chest pressure Glad this is resolved.  Recent CT scan of the chest looks clear  Plan:  -As  needed follow-up  Followup - 1 as needed follow-up with Dr. Chase Caller who was on home      SIGNATURE    Dr. Brand Males, M.D., F.C.C.P,  Pulmonary and Critical Care Medicine Staff Physician, [place] Director - Interstitial Lung Disease  Program  Pulmonary [place] at [place], [place], [address]  Pager: [phone], If no answer or between  15:00h - 7:00h: call [phone] Telephone: [phone]  3:32 PM 12/22/2020

## 2020-12-22 NOTE — Patient Instructions (Addendum)
ICD-10-CM   1. Vaccine counseling  Z71.85   2. Increased infection risk status post immunosuppressive therapy  Z92.25   3. Sensation of chest pressure  R07.89      1. Vaccine counseling 2.  Immunosuppression due to drug therapy  Today, we discussed that there is a good chance your body did not mount antibodies to the COVID-19 vaccine despite booster due to Rituximab.     Plan:  - Order IgG COVID-19 serology  - Follow COVID-19 precautions  -If there is no IgG response to Covid then we will refer you to monoclonal antibody prophylaxis EVUSHELD  3. Sensation of chest pressure Glad this is resolved.  Recent CT scan of the chest looks clear  Plan:  -As needed follow-up  Followup - 1 as needed follow-up with Dr. Chase Caller who was on home

## 2020-12-23 ENCOUNTER — Telehealth: Payer: Self-pay | Admitting: Internal Medicine

## 2020-12-23 LAB — SARS-COV-2 ANTIBODY(IGG)SPIKE,SEMI-QUANTITATIVE: SARS COV1 AB(IGG)SPIKE,SEMI QN: <1 index (ref <1.00)

## 2020-12-23 NOTE — Telephone Encounter (Signed)
Attempted to call pt but line went straight to VM. Unable to leave VM due to mailbox being full. Will try to call back later.

## 2020-12-23 NOTE — Telephone Encounter (Signed)
    ple let Gregory Fuller that despite the third shot of Covid booster he has not responded at all to that.  His IgG for Covid continues to be negative.  Plan -He needs to be on monoclonal antibody prophylaxis - EVUSHELD.  Please refer to [name] because he was the director of the program by placing an order "AMB REF EVUSHELD:"

## 2020-12-23 NOTE — Progress Notes (Signed)
No antibody made to vaccine. Pls refer "amb ref evusheld"

## 2020-12-27 NOTE — Telephone Encounter (Signed)
Called and spoke with pt letting him know the results of labwork and stated to him due to him having no antibodies to covid vaccines, MR wants him to be referred to receive EVUSHELD. Pt stated that he has been doing some research on this and is hesitant on having referral placed. Stated to pt that I could schedule a phone visit with MR so he can further discuss this and he verbalized understanding. OV scheduled for pt for a televisit 3/2. Nothing further needed.

## 2020-12-28 DIAGNOSIS — D8689 Sarcoidosis of other sites: Secondary | ICD-10-CM | POA: Diagnosis not present

## 2020-12-28 DIAGNOSIS — G0491 Myelitis, unspecified: Secondary | ICD-10-CM | POA: Diagnosis not present

## 2020-12-28 DIAGNOSIS — D84821 Immunodeficiency due to drugs: Secondary | ICD-10-CM | POA: Diagnosis not present

## 2020-12-28 DIAGNOSIS — M792 Neuralgia and neuritis, unspecified: Secondary | ICD-10-CM | POA: Diagnosis not present

## 2021-01-04 ENCOUNTER — Other Ambulatory Visit: Payer: Self-pay

## 2021-01-04 ENCOUNTER — Ambulatory Visit: Payer: BC Managed Care – PPO | Admitting: Internal Medicine

## 2021-01-07 MED FILL — GABAPENTIN 400 MG CAPSULE: 400 | 30 days supply | Qty: 210 | Fill #3

## 2021-02-06 MED FILL — Gabapentin Cap 400 MG: ORAL | 60 days supply | Qty: 420 | Fill #0 | Status: AC

## 2021-02-07 ENCOUNTER — Other Ambulatory Visit (HOSPITAL_COMMUNITY): Payer: Self-pay

## 2021-02-13 NOTE — Progress Notes (Signed)
Cardiology Office Note:    Date:  02/22/2021   ID:  Gregory Fuller, DOB 01-16-1981, MRN [id number]  PCP:  Delilah Shan, MD  [facility] Cardiologist: Rudean Haskell MD [place] Electrophysiologist:  None   CC: chest pain follow up  History of Present Illness:    Gregory Fuller is a 40 y.o. male with a hx of sarcoidosis (neurosarcoidosis) who presents with chest pain 11/2020.  In interim of this visit, patient normal Echo.  Patient notes that he is doing good.  Since last visit notes no changes.  Relevant interval testing or therapy include normal echo and DC prednisone (4 weeks).  There are no interval hospital/ED visit.    No chest pain or pressure .  No SOB/DOE and no PND/Orthopnea.  No weight gain or leg swelling.  No palpitations or syncope.  Notes that heart races when working out.  Past Medical History:  Diagnosis Date  . Asthma   . Bronchitis   . GERD (gastroesophageal reflux disease)   . Sarcoidosis   . Subacute transverse myelitis [facility])     Past Surgical History:  Procedure Laterality Date  . LYMPH NODE BIOPSY N/A 10/13/2014   Procedure: LYMPH NODE BIOPSY;  Surgeon: Melrose Nakayama, MD;  Location: [place];  Service: Thoracic;  Laterality: N/A;  . MEDIASTINOSCOPY N/A 10/13/2014   Procedure: MEDIASTINOSCOPY;  Surgeon: Melrose Nakayama, MD;  Location: [place];  Service: Thoracic;  Laterality: N/A;  . NO PAST SURGERIES    . VIDEO BRONCHOSCOPY WITH ENDOBRONCHIAL ULTRASOUND N/A 09/08/2014   Procedure: VIDEO BRONCHOSCOPY WITH ENDOBRONCHIAL ULTRASOUND;  Surgeon: Brand Males, MD;  [facility];  Service: Thoracic;  Laterality: N/A;    Current Medications: Current Meds  Medication Sig  . albuterol (PROVENTIL HFA) 108 (90 Base) MCG/ACT inhaler INHALE 2 PUFFS EVERY 6 HOURS AS NEEDED FOR WHEEZING OR SHORTNESS OF BREATH  . ALPRAZolam (XANAX) 0.25 MG tablet TAKE 1 TABLET BY MOUTH AT BEDTIME AS NEEDED  . Cholecalciferol (VITAMIN D3) 3000 units TABS Take  4,000 Units by mouth daily.  Marland Kitchen docusate sodium (COLACE) 100 MG capsule Take 100 mg by mouth daily as needed for mild constipation. Reported on 04/10/2016  . fluticasone (FLONASE) 50 MCG/ACT nasal spray Place into the nose.  . gabapentin (NEURONTIN) 400 MG capsule TAKE 2 CAPSULES IN THE MORNING, 2 CAPSULES AT LUNCH AND 3 CAPSULES AT BEDTIME  . ibuprofen (ADVIL,MOTRIN) 200 MG tablet Take 200 mg by mouth as needed for moderate pain.   . pantoprazole (PROTONIX) 40 MG tablet Take 1 tablet (40 mg total) by mouth daily.  . RiTUXimab (RITUXAN IV) Inject 1 each into the vein every 6 (six) months.      Allergies:   Patient has no known allergies.   Social History   Socioeconomic History  . Marital status: Married    Spouse name: Not on file  . Number of children: 0  . Years of education: 2  . Highest education level: Not on file  Occupational History  . Occupation: unemployed  Tobacco Use  . Smoking status: Never Smoker  . Smokeless tobacco: Never Used  . Tobacco comment: pt states he smoked socially  Substance and Sexual Activity  . Alcohol use: Yes    Comment: occassional  . Drug use: No    Comment: used to smoke marijuana  . Sexual activity: Not on file  Other Topics Concern  . Not on file  Social History Narrative  . Not on file  Social Determinants of Health   Financial Resource Strain: Not on file  Food Insecurity: Not on file  Transportation Needs: Not on file  Physical Activity: Not on file  Stress: Not on file  Social Connections: Not on file   Social: works in Scientist, research (life sciences) estate   Family History: The patient's family history includes Diabetes in his maternal grandmother; Seizures in his father and maternal grandfather; Thyroid disease in his mother.  History of coronary artery disease notable for no members. History of heart failure notable for no members. No history of cardiomyopathies including hypertrophic cardiomyopathy, left ventricular non-compaction, or  arrhythmogenic right ventricular cardiomyopathy. History of arrhythmia notable for no members. Denies family history of sudden cardiac death including drowning, car accidents, or unexplained deaths in the family. No history of bicuspid aortic valve or aortic aneurysm or dissection.  ROS:   Please see the history of present illness.     All other systems reviewed and are negative.  EKGs/Labs/Other Studies Reviewed:    The following studies were reviewed today:  EKG:   11/17/2020 Sinus Bradycardia rate 53 nonspecific (III) 04/10/2019 Sinus Rhythm rate 74 WNL  Transthoracic Echocardiogram: Date: 12/07/20 Results: No atypical WMA 1. Left ventricular ejection fraction, by estimation, is 65 to 70%. The  left ventricle has normal function. The left ventricle has no regional  wall motion abnormalities. Left ventricular diastolic parameters were  normal.  2. Right ventricular systolic function is normal. The right ventricular  size is normal. Tricuspid regurgitation signal is inadequate for assessing  PA pressure.  3. The mitral valve is normal in structure. Trivial mitral valve  regurgitation. No evidence of mitral stenosis.  4. The aortic valve is tricuspid. Aortic valve regurgitation is not  visualized. No aortic stenosis is present.  5. The inferior vena cava is normal in size with greater than 50%  respiratory variability, suggesting right atrial pressure of 3 mmHg.   PET Scan: Date: 08/27/2014 Results: IMPRESSION:  1. Cluster of hypermetabolic right infrahilar lymph nodes. This  activity is greater than reactive adenopathy. Consider granulomatous  disease versus malignancy including lymphoma. Atypical pattern for  both.  2. Small mildly metabolic nodule along the pleural surface of the  right lower lobe favors a small site of infection or inflammation.  3. Long segments of abnormal metabolic activity involving the spinal  cord previous of cervical spine and thoracic spine.  This corresponds  to the abnormal dural enhancement on comparison MRI . Presumably  this relates to the hilar adenopathy which would favor granulomas  disease or lymphoma rather than a primary CNS process).   NonCardiac CT: Date:11/09/2020 Results: No coronary or aortic calcium  Recent Labs: 11/08/2020: ALT 13; BUN 13; Creatinine, Ser 1.07; Potassium 4.0; Sodium 138  Recent Lipid Panel No results found for: CHOL, TRIG, HDL, CHOLHDL, VLDL, LDLCALC, LDLDIRECT   Risk Assessment/Calculations:     N/A  Physical Exam:    VS:  BP 112/72   Pulse 67   Ht 5\' 5"  (1.651 m)   Wt 203 lb (92.1 kg)   SpO2 99%   BMI 33.78 kg/m     Wt Readings from Last 3 Encounters:  02/22/21 203 lb (92.1 kg)  12/22/20 204 lb 6.4 oz (92.7 kg)  11/17/20 203 lb 6.4 oz (92.3 kg)    GEN:  Well nourished, well developed in no acute distress HEENT: Normal NECK: No JVD; No carotid bruits LYMPHATICS: No lymphadenopathy CARDIAC: RRR, no murmurs, rubs, gallops RESPIRATORY:  Clear to auscultation without rales, wheezing  or rhonchi  ABDOMEN: Soft, non-tender, non-distended MUSCULOSKELETAL:  No edema; No deformity  SKIN: Warm and dry NEUROLOGIC:  Alert and oriented x 3 PSYCHIATRIC:  Normal affect   ASSESSMENT:    1. Sarcoidosis   2. Neurosarcoidosis in adult   3. Solitary pulmonary nodule    PLAN:    In order of problems listed above:   Cardiac Sarcoidosis Eval Neurosarcoidosis Pulmonary sarcoidosis - With extracardiac sarcoidosis (lung, brain) - with prior lymph node biopsy - without palpitations , presyncope, or syncope. - without WMA reviewed echo with patient - without  complete left or right bundle branch block; presence of unexplained pathologic Q waves in two or more leads; sustained first-, second-, or third-degree AV block - with chest pain that has resolved, will defer CMR  PRN follow up unless new symptoms or abnormal test results warranting change in plan      Medication  Adjustments/Labs and Tests Ordered: Current medicines are reviewed at length with the patient today.  Concerns regarding medicines are outlined above.  No orders of the defined types were placed in this encounter.  No orders of the defined types were placed in this encounter.   Patient Instructions  Medication Instructions:  Your physician recommends that you continue on your current medications as directed. Please refer to the Current Medication list given to you today.  *If you need a refill on your cardiac medications before your next appointment, please call your pharmacy*   Lab Work: NONE If you have labs (blood work) drawn today and your tests are completely normal, you will receive your results only by: Marland Kitchen MyChart Message (if you have MyChart) OR . A paper copy in the mail If you have any lab test that is abnormal or we need to change your treatment, we will call you to review the results.   Testing/Procedures: NONE   Follow-Up:As needed At [facility], you and your health needs are our priority.  As part of our continuing mission to provide you with exceptional heart care, we have created designated Provider Care Teams.  These Care Teams include your primary Cardiologist (physician) and Advanced Practice Providers (APPs -  Physician Assistants and Nurse Practitioners) who all work together to provide you with the care you need, when you need it.      Signed, Werner Lean, MD  02/22/2021 10:38 AM    [facility]

## 2021-02-22 ENCOUNTER — Other Ambulatory Visit: Payer: Self-pay

## 2021-02-22 ENCOUNTER — Ambulatory Visit (INDEPENDENT_AMBULATORY_CARE_PROVIDER_SITE_OTHER): Payer: BC Managed Care – PPO | Admitting: Internal Medicine

## 2021-02-22 ENCOUNTER — Encounter: Payer: Self-pay | Admitting: Internal Medicine

## 2021-02-22 VITALS — BP 112/72 | HR 67 | Ht 65.0 in | Wt 203.0 lb

## 2021-02-22 DIAGNOSIS — D8689 Sarcoidosis of other sites: Secondary | ICD-10-CM | POA: Diagnosis not present

## 2021-02-22 DIAGNOSIS — D869 Sarcoidosis, unspecified: Secondary | ICD-10-CM | POA: Diagnosis not present

## 2021-02-22 DIAGNOSIS — R911 Solitary pulmonary nodule: Secondary | ICD-10-CM | POA: Diagnosis not present

## 2021-02-22 NOTE — Patient Instructions (Signed)
Medication Instructions:  Your physician recommends that you continue on your current medications as directed. Please refer to the Current Medication list given to you today.  *If you need a refill on your cardiac medications before your next appointment, please call your pharmacy*   Lab Work: NONE If you have labs (blood work) drawn today and your tests are completely normal, you will receive your results only by: Marland Kitchen MyChart Message (if you have MyChart) OR . A paper copy in the mail If you have any lab test that is abnormal or we need to change your treatment, we will call you to review the results.   Testing/Procedures: NONE   Follow-Up:As needed At [facility], you and your health needs are our priority.  As part of our continuing mission to provide you with exceptional heart care, we have created designated Provider Care Teams.  These Care Teams include your primary Cardiologist (physician) and Advanced Practice Providers (APPs -  Physician Assistants and Nurse Practitioners) who all work together to provide you with the care you need, when you need it.

## 2021-03-20 DIAGNOSIS — G0491 Myelitis, unspecified: Secondary | ICD-10-CM | POA: Diagnosis not present

## 2021-03-20 DIAGNOSIS — D8689 Sarcoidosis of other sites: Secondary | ICD-10-CM | POA: Diagnosis not present

## 2021-04-05 ENCOUNTER — Other Ambulatory Visit (HOSPITAL_COMMUNITY): Payer: Self-pay

## 2021-04-07 ENCOUNTER — Other Ambulatory Visit (HOSPITAL_COMMUNITY): Payer: Self-pay

## 2021-04-08 ENCOUNTER — Other Ambulatory Visit (HOSPITAL_COMMUNITY): Payer: Self-pay

## 2021-04-08 MED ORDER — GABAPENTIN 400 MG PO CAPS
ORAL_CAPSULE | ORAL | 1 refills | Status: DC
  Filled 2021-04-08: qty 210, 30d supply, fill #0
  Filled 2021-05-03: qty 210, 30d supply, fill #1
  Filled 2021-06-07: qty 630, 90d supply, fill #2
  Filled 2021-09-05: qty 630, 90d supply, fill #3

## 2021-04-12 ENCOUNTER — Other Ambulatory Visit (HOSPITAL_COMMUNITY): Payer: Self-pay

## 2021-04-12 DIAGNOSIS — D869 Sarcoidosis, unspecified: Secondary | ICD-10-CM | POA: Diagnosis not present

## 2021-04-12 DIAGNOSIS — Z7952 Long term (current) use of systemic steroids: Secondary | ICD-10-CM | POA: Diagnosis not present

## 2021-04-12 DIAGNOSIS — D8689 Sarcoidosis of other sites: Secondary | ICD-10-CM | POA: Diagnosis not present

## 2021-04-12 DIAGNOSIS — F419 Anxiety disorder, unspecified: Secondary | ICD-10-CM | POA: Diagnosis not present

## 2021-04-12 MED ORDER — FAMOTIDINE 20 MG PO TABS
ORAL_TABLET | ORAL | 99 refills | Status: DC
  Filled 2021-04-12: qty 30, 30d supply, fill #0

## 2021-04-18 ENCOUNTER — Other Ambulatory Visit (HOSPITAL_COMMUNITY): Payer: Self-pay

## 2021-04-18 MED ORDER — ALPRAZOLAM 0.25 MG PO TABS
0.2500 mg | ORAL_TABLET | Freq: Every evening | ORAL | 0 refills | Status: DC | PRN
  Filled 2021-04-18: qty 30, 30d supply, fill #0

## 2021-04-25 ENCOUNTER — Other Ambulatory Visit (HOSPITAL_COMMUNITY): Payer: Self-pay

## 2021-05-03 ENCOUNTER — Other Ambulatory Visit (HOSPITAL_COMMUNITY): Payer: Self-pay

## 2021-05-06 ENCOUNTER — Other Ambulatory Visit (HOSPITAL_COMMUNITY): Payer: Self-pay

## 2021-05-11 DIAGNOSIS — Z13220 Encounter for screening for lipoid disorders: Secondary | ICD-10-CM | POA: Diagnosis not present

## 2021-05-15 ENCOUNTER — Other Ambulatory Visit (HOSPITAL_COMMUNITY): Payer: Self-pay

## 2021-05-15 DIAGNOSIS — J302 Other seasonal allergic rhinitis: Secondary | ICD-10-CM | POA: Diagnosis not present

## 2021-05-15 DIAGNOSIS — Z86010 Personal history of colonic polyps: Secondary | ICD-10-CM | POA: Diagnosis not present

## 2021-05-15 DIAGNOSIS — R933 Abnormal findings on diagnostic imaging of other parts of digestive tract: Secondary | ICD-10-CM | POA: Diagnosis not present

## 2021-05-15 MED ORDER — FLUTICASONE PROPIONATE 50 MCG/ACT NA SUSP
NASAL | 5 refills | Status: DC
  Filled 2021-05-15: qty 16, 31d supply, fill #0

## 2021-06-05 DIAGNOSIS — Z6832 Body mass index (BMI) 32.0-32.9, adult: Secondary | ICD-10-CM | POA: Diagnosis not present

## 2021-06-05 DIAGNOSIS — G939 Disorder of brain, unspecified: Secondary | ICD-10-CM | POA: Diagnosis not present

## 2021-06-05 DIAGNOSIS — R590 Localized enlarged lymph nodes: Secondary | ICD-10-CM | POA: Diagnosis not present

## 2021-06-05 DIAGNOSIS — D860 Sarcoidosis of lung: Secondary | ICD-10-CM | POA: Diagnosis not present

## 2021-06-05 DIAGNOSIS — D8689 Sarcoidosis of other sites: Secondary | ICD-10-CM | POA: Diagnosis not present

## 2021-06-05 DIAGNOSIS — G959 Disease of spinal cord, unspecified: Secondary | ICD-10-CM | POA: Diagnosis not present

## 2021-06-05 DIAGNOSIS — D869 Sarcoidosis, unspecified: Secondary | ICD-10-CM | POA: Diagnosis not present

## 2021-06-07 ENCOUNTER — Other Ambulatory Visit (HOSPITAL_COMMUNITY): Payer: Self-pay

## 2021-06-08 ENCOUNTER — Other Ambulatory Visit (HOSPITAL_COMMUNITY): Payer: Self-pay

## 2021-06-08 DIAGNOSIS — J069 Acute upper respiratory infection, unspecified: Secondary | ICD-10-CM | POA: Diagnosis not present

## 2021-06-08 DIAGNOSIS — R059 Cough, unspecified: Secondary | ICD-10-CM | POA: Diagnosis not present

## 2021-06-08 DIAGNOSIS — R0981 Nasal congestion: Secondary | ICD-10-CM | POA: Diagnosis not present

## 2021-06-08 DIAGNOSIS — Z20822 Contact with and (suspected) exposure to covid-19: Secondary | ICD-10-CM | POA: Diagnosis not present

## 2021-06-08 MED ORDER — PREDNISONE 20 MG PO TABS
ORAL_TABLET | ORAL | 0 refills | Status: DC
  Filled 2021-06-08: qty 10, 5d supply, fill #0

## 2021-06-08 MED ORDER — TRIAMCINOLONE ACETONIDE 55 MCG/ACT NA AERO
INHALATION_SPRAY | NASAL | 0 refills | Status: DC
  Filled 2021-06-08: qty 16.9, 30d supply, fill #0

## 2021-06-09 ENCOUNTER — Other Ambulatory Visit (HOSPITAL_COMMUNITY): Payer: Self-pay

## 2021-06-17 ENCOUNTER — Other Ambulatory Visit (HOSPITAL_COMMUNITY): Payer: Self-pay

## 2021-07-05 ENCOUNTER — Other Ambulatory Visit (HOSPITAL_COMMUNITY): Payer: Self-pay

## 2021-07-05 DIAGNOSIS — J40 Bronchitis, not specified as acute or chronic: Secondary | ICD-10-CM | POA: Diagnosis not present

## 2021-07-05 MED ORDER — ALBUTEROL SULFATE HFA 108 (90 BASE) MCG/ACT IN AERS
INHALATION_SPRAY | RESPIRATORY_TRACT | 0 refills | Status: AC
  Filled 2021-07-05: qty 8.5, 33d supply, fill #0

## 2021-07-07 ENCOUNTER — Other Ambulatory Visit (HOSPITAL_COMMUNITY): Payer: Self-pay

## 2021-08-04 ENCOUNTER — Other Ambulatory Visit: Payer: Self-pay

## 2021-08-04 ENCOUNTER — Other Ambulatory Visit (HOSPITAL_COMMUNITY): Payer: Self-pay

## 2021-08-04 ENCOUNTER — Encounter: Payer: Self-pay | Admitting: Internal Medicine

## 2021-08-04 ENCOUNTER — Ambulatory Visit (INDEPENDENT_AMBULATORY_CARE_PROVIDER_SITE_OTHER): Payer: BC Managed Care – PPO | Admitting: Internal Medicine

## 2021-08-04 ENCOUNTER — Ambulatory Visit: Payer: BC Managed Care – PPO | Admitting: Internal Medicine

## 2021-08-04 VITALS — BP 112/70 | HR 63 | Temp 98.4°F | Ht 65.0 in | Wt 201.4 lb

## 2021-08-04 DIAGNOSIS — Z9225 Personal history of immunosupression therapy: Secondary | ICD-10-CM | POA: Diagnosis not present

## 2021-08-04 DIAGNOSIS — J209 Acute bronchitis, unspecified: Secondary | ICD-10-CM

## 2021-08-04 DIAGNOSIS — Z7185 Encounter for immunization safety counseling: Secondary | ICD-10-CM | POA: Diagnosis not present

## 2021-08-04 MED ORDER — AZITHROMYCIN 250 MG PO TABS
250.0000 mg | ORAL_TABLET | Freq: Every day | ORAL | 0 refills | Status: AC
  Filled 2021-08-04: qty 5, 5d supply, fill #0

## 2021-08-04 NOTE — Patient Instructions (Addendum)
ICD-10-CM   1. Acute bronchitis, unspecified organism  J20.9     2. Vaccine counseling  Z71.85     3. Increased infection risk status post immunosuppressive therapy  Z92.25        Start Zithromax 250 mg daily for 5 days Call the office to let us know if you want the Evusheld Continue as needed albuterol Continue over-the-counter Mucinex or Robitussin Continue Flonase Recommend covid vaccine when able to get it. Continue to wear your mask daily, especially in large crowds

## 2021-08-04 NOTE — Progress Notes (Signed)
OV 12/22/2020  Subjective:  Patient ID: Gregory Fuller, male , DOB: 1980/11/28 , age 40 y.o. , MRN: [id number] , ADDRESS: [place] [id number] PCP Delilah Shan, MD Patient Care Team: Delilah Shan, MD as PCP - General (Family Medicine) Brand Males, MD as Consulting Physician (Pulmonary Disease) Carolan Shiver, MD as Consulting Physician (Rheumatology)  This Provider for this visit: Treatment Team:  Attending Provider: Brand Males, MD    12/22/2020 -   Chief Complaint  Patient presents with   Follow-up    Doing better     HPI Gregory Fuller 40 y.o. -neurosarcoidosis of the spine.  Presents to pulmonary clinic for follow-up because he is always had some kind of a chest pressure.  Most recently when seen in November 2020 when he had more chest pressure.  He then followed up with nurse practitioner in December.  Chest pressure persisted and then we got a CT scan of the chest and that is fairly clear he has this mild mediastinal adenopathy.  He is supposed to go to [place] around new year Christmas time but we checked IgG after his first 2 vaccine shots and he had no IgG.  Therefore he spent this time in [place] for the holidays.  Currently asymptomatic.  Doing well no respiratory complaints.  He is noted his Covid booster but he does not know his IgG status.    CT Chest data  No results found.    PFT  PFT Results Latest Ref Rng & Units 08/27/2014  FVC-Pre L 3.79  FVC-Predicted Pre % 96  FVC-Post L 3.97  FVC-Predicted Post % 100  Pre FEV1/FVC % % 89  Post FEV1/FCV % % 90  FEV1-Pre L 3.38  FEV1-Predicted Pre % 102  FEV1-Post L 3.59  DLCO uncorrected ml/min/mmHg 28.08  DLCO UNC% % 104  DLCO corrected ml/min/mmHg 28.16  DLCO COR %Predicted % 104  DLVA Predicted % 124  TLC L 5.67  TLC % Predicted % 92  RV % Predicted % 128    OV 08/04/2021  Subjective:  Patient ID: Gregory Fuller, male , DOB: September 07, 1981 , age 51 y.o. , MRN:  [id number] , ADDRESS: [place] [address] PCP Delilah Shan, MD Patient Care Team: Delilah Shan, MD as PCP - General (Family Medicine) Brand Males, MD as Consulting Physician (Pulmonary Disease) Carolan Shiver, MD as Consulting Physician (Rheumatology)  This Provider for this visit: Treatment Team:  Attending Provider: Brand Males, MD    08/04/2021 -   Chief Complaint  Patient presents with   Follow-up    Pt states he has been doing okay since last visit. States he developed a cold at beginning of month which has gotten better but states he is still coughing up phlegm in the mornings and also has had problems of hoarseness and congestion.    HPI SEBASTIN PERLMUTTER 40 y.o. -pleasant gentleman with a history of neurosarcoidosis and asthma here for evaluation of a productive cough that has not resolved since August. Patient reports that he went to [place] to visit at the end of July and slept in a room with cold AC. Had mild cough at the end of his visit which he managed with cough drops. The symptoms worsened with the cough becoming more productive with hoarseness and congestion. He describes the mucus as yellowish-green. The cough is worse at night and in the morning. He was seen in urgent care was  diagnosed with a URI and other conservative management was recommended.  The cough does not improve.  Back to the urgent care at the end of August and was diagnosed with bronchitis.  Since then he has tried Mucinex and Alka-Seltzer with some improvement in his cough. He uses his albuterol daily before sleep. He continues to have the cough that has now become less productive and less frequent.  He denies any fever, chills, sore throat, shortness of breath, congestion, wheezing, chest pain, nausea or vomiting.  He received his flu vaccine yesterday.  He was advised by his rheumatologist to get a COVID booster at least a week after his flu vaccine.   CT Chest data  No  results found.    PFT  PFT Results Latest Ref Rng & Units 08/27/2014  FVC-Pre L 3.79  FVC-Predicted Pre % 96  FVC-Post L 3.97  FVC-Predicted Post % 100  Pre FEV1/FVC % % 89  Post FEV1/FCV % % 90  FEV1-Pre L 3.38  FEV1-Predicted Pre % 102  FEV1-Post L 3.59  DLCO uncorrected ml/min/mmHg 28.08  DLCO UNC% % 104  DLCO corrected ml/min/mmHg 28.16  DLCO COR %Predicted % 104  DLVA Predicted % 124  TLC L 5.67  TLC % Predicted % 92  RV % Predicted % 128       has a past medical history of Asthma, Bronchitis, GERD (gastroesophageal reflux disease), Sarcoidosis, and Subacute transverse myelitis ([place]).   reports that he has never smoked. He has never used smokeless tobacco.  Past Surgical History:  Procedure Laterality Date   LYMPH NODE BIOPSY N/A 10/13/2014   Procedure: LYMPH NODE BIOPSY;  Surgeon: Melrose Nakayama, MD;  Location: [place];  Service: Thoracic;  Laterality: N/A;   MEDIASTINOSCOPY N/A 10/13/2014   Procedure: MEDIASTINOSCOPY;  Surgeon: Melrose Nakayama, MD;  Location: [place];  Service: Thoracic;  Laterality: N/A;   NO PAST SURGERIES     VIDEO BRONCHOSCOPY WITH ENDOBRONCHIAL ULTRASOUND N/A 09/08/2014   Procedure: VIDEO BRONCHOSCOPY WITH ENDOBRONCHIAL ULTRASOUND;  Surgeon: Brand Males, MD;  Location: [place];  Service: Thoracic;  Laterality: N/A;    No Known Allergies  Immunization History  Administered Date(s) Administered   Influenza Split 08/06/2015, 07/16/2017, 08/04/2018, 08/06/2019, 08/08/2020   Influenza,inj,Quad PF,6+ Mos 08/09/2014, 07/06/2016, 07/16/2017, 08/06/2019, 08/08/2020   Influenza-Unspecified 08/09/2014, 08/04/2018, 08/06/2019   PFIZER(Purple Top)SARS-COV-2 Vaccination 01/15/2020, 02/08/2020   Pneumococcal Conjugate-13 11/12/2014   Pneumococcal Polysaccharide-23 03/07/2015   Pneumococcal-Unspecified 03/07/2015   Tdap 11/15/2016    Family History  Problem Relation Age of Onset   Thyroid disease Mother    Seizures Father    Diabetes  Maternal Grandmother    Seizures Maternal Grandfather      Current Outpatient Medications:    albuterol (VENTOLIN HFA) 108 (90 Base) MCG/ACT inhaler, Inhale 1 puff into the lungs every 4 (four) hours as needed for up to 30 days for Wheezing or Shortness of Breath., Disp: 8.5 g, Rfl: 0   ALPRAZolam (XANAX) 0.25 MG tablet, TAKE 1 TABLET BY MOUTH AT BEDTIME AS NEEDED, Disp: 30 tablet, Rfl: 0   Cholecalciferol (VITAMIN D3) 3000 units TABS, Take 4,000 Units by mouth daily., Disp: , Rfl:    docusate sodium (COLACE) 100 MG capsule, Take 100 mg by mouth daily as needed for mild constipation. Reported on 04/10/2016, Disp: , Rfl:    famotidine (PEPCID) 20 MG tablet, Take 1 tablet (20 mg total) by mouth at bedtime as needed for heartburn., Disp: 30 tablet, Rfl: 99   fluticasone (FLONASE)  50 MCG/ACT nasal spray, Place into the nose., Disp: , Rfl:    gabapentin (NEURONTIN) 400 MG capsule, Take 2 capsules by mouth in the morning, 2 capsules by mouth at lunch and 3 capsules by mouth at bedtime, Disp: 630 capsule, Rfl: 1   ibuprofen (ADVIL,MOTRIN) 200 MG tablet, Take 200 mg by mouth as needed for moderate pain. , Disp: , Rfl:    pantoprazole (PROTONIX) 40 MG tablet, Take 1 tablet (40 mg total) by mouth daily., Disp: 90 tablet, Rfl: 1   RiTUXimab (RITUXAN IV), Inject 1 each into the vein every 6 (six) months. , Disp: , Rfl:    triamcinolone (NASACORT) 55 MCG/ACT AERO nasal inhaler, Use 2 sprays in each nostril daily for 7 days., Disp: 16.9 mL, Rfl: 0   gabapentin (NEURONTIN) 400 MG capsule, TAKE 2 CAPSULES IN THE MORNING, 2 CAPSULES AT LUNCH AND 3 CAPSULES AT BEDTIME, Disp: 630 capsule, Rfl: 1      Objective:   Vitals:   08/04/21 1534  BP: 112/70  Pulse: 63  Temp: 98.4 F (36.9 C)  TempSrc: Oral  SpO2: 98%  Weight: 201 lb 6.4 oz (91.4 kg)  Height: 5\' 5"  (1.651 m)    Estimated body mass index is 33.51 kg/m as calculated from the following:   Height as of this encounter: 5\' 5"  (1.651 m).   Weight as  of this encounter: 201 lb 6.4 oz (91.4 kg).  @WEIGHTCHANGE @  [employer]   08/04/21 1534  Weight: 201 lb 6.4 oz (91.4 kg)    Physical Exam General: Pleasant, well-appearing middle-age man.  No distress.  Neuro: Alert and Oriented x 3. GCS 15. Speech normal Psych: Normal mood and affect. Resp: Lungs clear to auscultation bilaterally.  No wheezing or rales.  No increased work of breathing. CVS: Normal heart sounds. Murmurs -regular rate and rhythm. Ext: Stigmata of Connective Tissue Disease - no HEENT: Normal upper airway. PEERL +. No post nasal drip.  Moist mucous membranes.  No tonsillar exudate.    Assessment:  Bronchitis  No diagnosis found. 40 year old with a history of neurosarcoidosis and asthma here for evaluation after a recent diagnosis of bronchitis. Patient reports exposure to cold air that trigger his symptoms.  Reports a productive cough of yellowish-greenish sputum that is improving slowly. Using his albuterol inhaler daily at night. Patient is immunocompromise which is likely the reason for the prolonged nature of his symptoms.   We will try a 5-day course of Zithromax to help improve symptoms.   Had another discussion with patient about getting Evushed infusion due to his immunocompromised state. Patient now interested in getting the infusion. Plan to do more research on the side effects before preceding.     Plan:  Start Zithromax 250 mg daily for 5 days Call the office to let us know if you want the Evusheld Continue as needed albuterol Continue over-the-counter Mucinex or Robitussin Continue Flonase Recommend covid vaccine when able to get it. Continue to wear your mask daily, especially in large crowds  Patient Instructions  Start Zithromax 250 mg daily for 5 days Call the office to let us know if you want the Evusheld Continue as needed albuterol Continue over-the-counter Mucinex or Robitussin Continue Flonase Recommend covid vaccine when able to get  it. Continue to wear your mask daily, especially in large crowds   Blenda Bridegroom, MD 08/04/2021, 5:07 PM IM Resident, PGY-2 [place] 41:10   3:43 PM 08/04/2021

## 2021-08-16 DIAGNOSIS — D1779 Benign lipomatous neoplasm of other sites: Secondary | ICD-10-CM | POA: Diagnosis not present

## 2021-08-16 DIAGNOSIS — D869 Sarcoidosis, unspecified: Secondary | ICD-10-CM | POA: Diagnosis not present

## 2021-08-16 DIAGNOSIS — D84821 Immunodeficiency due to drugs: Secondary | ICD-10-CM | POA: Diagnosis not present

## 2021-08-16 DIAGNOSIS — G0491 Myelitis, unspecified: Secondary | ICD-10-CM | POA: Diagnosis not present

## 2021-08-16 DIAGNOSIS — D8689 Sarcoidosis of other sites: Secondary | ICD-10-CM | POA: Diagnosis not present

## 2021-08-23 DIAGNOSIS — Z23 Encounter for immunization: Secondary | ICD-10-CM | POA: Diagnosis not present

## 2021-08-28 DIAGNOSIS — R5383 Other fatigue: Secondary | ICD-10-CM | POA: Diagnosis not present

## 2021-09-01 ENCOUNTER — Other Ambulatory Visit (HOSPITAL_COMMUNITY): Payer: Self-pay

## 2021-09-01 DIAGNOSIS — Z0000 Encounter for general adult medical examination without abnormal findings: Secondary | ICD-10-CM | POA: Diagnosis not present

## 2021-09-01 DIAGNOSIS — L989 Disorder of the skin and subcutaneous tissue, unspecified: Secondary | ICD-10-CM | POA: Diagnosis not present

## 2021-09-01 MED ORDER — MUPIROCIN 2 % EX OINT
TOPICAL_OINTMENT | CUTANEOUS | 0 refills | Status: DC
  Filled 2021-09-01: qty 22, 7d supply, fill #0

## 2021-09-01 MED ORDER — VALACYCLOVIR HCL 1 G PO TABS
ORAL_TABLET | ORAL | 0 refills | Status: DC
  Filled 2021-09-01: qty 14, 7d supply, fill #0

## 2021-09-05 ENCOUNTER — Other Ambulatory Visit (HOSPITAL_COMMUNITY): Payer: Self-pay

## 2021-09-06 ENCOUNTER — Other Ambulatory Visit (HOSPITAL_COMMUNITY): Payer: Self-pay

## 2021-09-06 MED ORDER — GABAPENTIN 400 MG PO CAPS
ORAL_CAPSULE | ORAL | 1 refills | Status: DC
  Filled 2021-09-06: qty 630, 90d supply, fill #0
  Filled 2021-12-03: qty 630, 90d supply, fill #1

## 2021-09-07 ENCOUNTER — Other Ambulatory Visit (HOSPITAL_COMMUNITY): Payer: Self-pay

## 2021-09-19 DIAGNOSIS — U071 COVID-19: Secondary | ICD-10-CM | POA: Diagnosis not present

## 2021-09-19 NOTE — Telephone Encounter (Signed)
  Dx is covid He is very immune suppressed and is on Rituxan-  He needs MAB - please make a referral to infusion center - this can be done next day or two But needs   PAXLOVID 09/19/2021    Paxlovid (nirmatelvir 300/Ritonavir100) - BID x 5 days - for GFR >= 60 (normal kidney function in Jan 2022 with [place] and ATrium 08/28/21)  PLEASE CHECK MED LIST for the following issues. Please check the 2 different condition related to concomitant medications in alphabetical order  If Gregory Fuller with DOB [birth year] is on any of the following Strong CYP3A inhibtors - this patient Gregory Fuller should withold these concomitant meds so they can start paxlovid stratight away. If taking any of these:  alfuzosin, amiodarone, clozapine, colchicine, dihydroergotamine, dronedarone, ergotamine, flecainide, lovastatin, lurasidone, methylergonovine, midazolam [oral], pethidine, pimozide, propafenone, propoxyphene, quinidine, ranolazine, sildenafil simvastatin, triazolam).   If   Gregory Fuller  with dob [birth year] Is on any of these other strong CYP3A inducers then starting paxlovid should be delayed and the following meds should wash out first. These are dapalutamide, carbamazepine, phenobarbital, phenytoin, rifampin, St John's wort) - let me know immediately and we should delay starting paxlovid by some days even if he stops these medication.    PLEASE INFORM Gregory Fuller  OF FOLLOWING SIDE EFFECTS  Side effects - all < 5%  - skin rash (and veyr rare a conditon called TEN) - angiomedia  - myalgia - jaundice - high bP (1%) - loss of taste  - diarrhea - rebound

## 2021-09-19 NOTE — Telephone Encounter (Signed)
MR please advise on this my chart message.  He stated that his fever was 104.9 and I advised him to seek emergency care.

## 2021-09-20 NOTE — Telephone Encounter (Signed)
Thanks. Even though he got infusion because he is immune supprssed I would still recommend paxlovid to help control viral replication. Pleas see earlier note on paxlovid

## 2021-09-27 DIAGNOSIS — Z6831 Body mass index (BMI) 31.0-31.9, adult: Secondary | ICD-10-CM | POA: Diagnosis not present

## 2021-09-27 DIAGNOSIS — D171 Benign lipomatous neoplasm of skin and subcutaneous tissue of trunk: Secondary | ICD-10-CM | POA: Diagnosis not present

## 2021-10-16 DIAGNOSIS — D8689 Sarcoidosis of other sites: Secondary | ICD-10-CM | POA: Diagnosis not present

## 2021-10-16 DIAGNOSIS — G0491 Myelitis, unspecified: Secondary | ICD-10-CM | POA: Diagnosis not present

## 2021-10-19 ENCOUNTER — Other Ambulatory Visit (HOSPITAL_COMMUNITY): Payer: Self-pay

## 2021-10-19 DIAGNOSIS — R059 Cough, unspecified: Secondary | ICD-10-CM | POA: Diagnosis not present

## 2021-10-19 DIAGNOSIS — R051 Acute cough: Secondary | ICD-10-CM | POA: Diagnosis not present

## 2021-10-19 DIAGNOSIS — J189 Pneumonia, unspecified organism: Secondary | ICD-10-CM | POA: Diagnosis not present

## 2021-10-19 DIAGNOSIS — Z20822 Contact with and (suspected) exposure to covid-19: Secondary | ICD-10-CM | POA: Diagnosis not present

## 2021-10-19 MED ORDER — AMOXICILLIN-POT CLAVULANATE 875-125 MG PO TABS
ORAL_TABLET | ORAL | 0 refills | Status: DC
  Filled 2021-10-19: qty 10, 5d supply, fill #0

## 2021-10-19 MED ORDER — AZITHROMYCIN 250 MG PO TABS
ORAL_TABLET | ORAL | 0 refills | Status: DC
  Filled 2021-10-19: qty 6, 5d supply, fill #0

## 2021-10-20 ENCOUNTER — Ambulatory Visit (INDEPENDENT_AMBULATORY_CARE_PROVIDER_SITE_OTHER): Payer: BC Managed Care – PPO | Admitting: Primary Care

## 2021-10-20 ENCOUNTER — Other Ambulatory Visit: Payer: Self-pay

## 2021-10-20 ENCOUNTER — Encounter: Payer: Self-pay | Admitting: Primary Care

## 2021-10-20 VITALS — BP 116/78 | HR 101 | Temp 102.3°F | Ht 65.0 in | Wt 194.6 lb

## 2021-10-20 DIAGNOSIS — J209 Acute bronchitis, unspecified: Secondary | ICD-10-CM

## 2021-10-20 DIAGNOSIS — J189 Pneumonia, unspecified organism: Secondary | ICD-10-CM

## 2021-10-20 LAB — BASIC METABOLIC PANEL
BUN: 12 mg/dL (ref 6–23)
CO2: 26 mEq/L (ref 19–32)
Calcium: 10.1 mg/dL (ref 8.4–10.5)
Chloride: 103 mEq/L (ref 96–112)
Creatinine, Ser: 1.12 mg/dL (ref 0.40–1.50)
GFR: 82.79 mL/min (ref >60.00)
Glucose, Bld: 95 mg/dL (ref 70–99)
Potassium: 4.4 mEq/L (ref 3.5–5.1)
Sodium: 138 mEq/L (ref 135–145)

## 2021-10-20 LAB — CBC WITH DIFFERENTIAL/PLATELET
Basophils Absolute: 0 10*3/uL (ref 0.0–0.1)
Basophils Relative: 0.4 % (ref 0.0–3.0)
Eosinophils Absolute: 0 10*3/uL (ref 0.0–0.7)
Eosinophils Relative: 0 % (ref 0.0–5.0)
HCT: 43.2 % (ref 39.0–52.0)
Hemoglobin: 14.5 g/dL (ref 13.0–17.0)
Lymphocytes Relative: 35 % (ref 12.0–46.0)
Lymphs Abs: 1.9 10*3/uL (ref 0.7–4.0)
MCHC: 33.5 g/dL (ref 30.0–36.0)
MCV: 80.6 fl (ref 78.0–100.0)
Monocytes Absolute: 0.8 10*3/uL (ref 0.1–1.0)
Monocytes Relative: 14.3 % — ABNORMAL HIGH (ref 3.0–12.0)
Neutro Abs: 2.7 10*3/uL (ref 1.4–7.7)
Neutrophils Relative %: 50.3 % (ref 43.0–77.0)
Platelets: 199 10*3/uL (ref 150.0–400.0)
RBC: 5.36 Mil/uL (ref 4.22–5.81)
RDW: 14.6 % (ref 11.5–15.5)
WBC: 5.3 10*3/uL (ref 4.0–10.5)

## 2021-10-20 LAB — POC COVID19 BINAXNOW: SARS Coronavirus 2 Ag: NEGATIVE

## 2021-10-20 NOTE — Patient Instructions (Addendum)
Recommendations: - Continue to take Augmentin and Azithromycin as prescribed - Take Tylenol 1,000mg  every 6 hours (do not exceed 4grams in 24 hours) - Start REGULAR mucinex 1,200mg  twice daily for 1 week (get this over the counter) - REST (no work of 3-5 days) and PUSH oral fluids (clear liquids, avoid caffeine)  - Recommend you get a pulse oximeter to monitor O2 level and HR. If temp remains elevated with tylenol and/or oxygen <88% and HR >120 need to go to ED  - Needs CXR in 4-6 weeks   Orders: - Labs today (ordered) - Covid test today/ unable to do flu swab (done)  Follow-up: - 4 weeks with Eustaquio Maize NP (CXR prior)

## 2021-10-20 NOTE — Progress Notes (Signed)
@Patient  ID: Gregory Fuller, male    DOB: 1981-07-30, 40 y.o.   MRN: [id number]  Chief Complaint  Patient presents with   Follow-up    Coughing, body aches    Referring provider: Delilah Shan, MD  HPI: 40 year old male, never smoked. PMH significant for asthma and neurosarcoidosis. Patient of Dr. Chase Caller, last seen on  08/04/21. Patient had covid in November 2022.   10/20/2021 Patient presents today for acute OV/cough. He first noticed bubbling sound when laying and developed productive cough with clear mucus and  2 days ago. Patient went to UC yesterday, covid and flu were negative. CXR showed possible PNA. He was given prescription for Augmentin and azithromycin which he started last night. He had covid in November 2022 and was treated with antiviral. He had Rituxin infusion Monday. When he takes deep breath he starts to cough. No significant shortness of breath or wheezing symptoms. No weight gain or leg swelling.    No Known Allergies  Immunization History  Administered Date(s) Administered   Influenza Split 08/06/2015, 07/16/2017, 08/04/2018, 08/06/2019, 08/08/2020   Influenza,inj,Quad PF,6+ Mos 08/09/2014, 07/06/2016, 07/16/2017, 08/06/2019, 08/08/2020, 08/03/2021   Influenza-Unspecified 08/09/2014, 08/04/2018, 08/06/2019   PFIZER Comirnaty(Gray Top)Covid-19 Tri-Sucrose Vaccine 11/17/2020   PFIZER(Purple Top)SARS-COV-2 Vaccination 01/15/2020, 02/08/2020   Pfizer Covid-19 Vaccine Bivalent Booster 52yrs & up 08/23/2021   Pneumococcal Conjugate-13 11/12/2014   Pneumococcal Polysaccharide-23 03/07/2015   Pneumococcal-Unspecified 03/07/2015   Tdap 11/15/2016    Past Medical History:  Diagnosis Date   Asthma    Bronchitis    GERD (gastroesophageal reflux disease)    Sarcoidosis    Subacute transverse myelitis ([place])     Tobacco History: Social History   Tobacco Use  Smoking Status Never  Smokeless Tobacco Never  Tobacco Comments   pt states he smoked socially    Counseling given: Not Answered Tobacco comments: pt states he smoked socially   Outpatient Medications Prior to Visit  Medication Sig Dispense Refill   albuterol (VENTOLIN HFA) 108 (90 Base) MCG/ACT inhaler Inhale 1 puff into the lungs every 4 (four) hours as needed for up to 30 days for Wheezing or Shortness of Breath. 8.5 g 0   ALPRAZolam (XANAX) 0.25 MG tablet TAKE 1 TABLET BY MOUTH AT BEDTIME AS NEEDED 30 tablet 0   amoxicillin-clavulanate (AUGMENTIN) 875-125 MG tablet Take 1 tablet by mouth 2 times daily for 5 days. 10 tablet 0   azithromycin (ZITHROMAX) 250 MG tablet Take 2 tablets (500 mg total) by mouth daily for 1 day, then 1 tablet (250 mg total) daily for 4 days. 6 tablet 0   Cholecalciferol (VITAMIN D3) 3000 units TABS Take 4,000 Units by mouth daily.     docusate sodium (COLACE) 100 MG capsule Take 100 mg by mouth daily as needed for mild constipation. Reported on 04/10/2016     famotidine (PEPCID) 20 MG tablet Take 1 tablet (20 mg total) by mouth at bedtime as needed for heartburn. 30 tablet 99   fluticasone (FLONASE) 50 MCG/ACT nasal spray Place into the nose.     gabapentin (NEURONTIN) 400 MG capsule Take 2 capsules by mouth in the morning, 2 capsules by mouth at lunch and 3 capsules by mouth at bedtime 630 capsule 1   gabapentin (NEURONTIN) 400 MG capsule Take 2 capsules in the morning, 2 capsules at lunch and 3 capsules at bedtime 630 capsule 1   ibuprofen (ADVIL,MOTRIN) 200 MG tablet Take 200 mg by mouth as needed for moderate pain.  mupirocin ointment (BACTROBAN) 2 % Apply to affected area 2 times daily. 22 g 0   pantoprazole (PROTONIX) 40 MG tablet Take 1 tablet (40 mg total) by mouth daily. 90 tablet 1   RiTUXimab (RITUXAN IV) Inject 1 each into the vein every 6 (six) months.      triamcinolone (NASACORT) 55 MCG/ACT AERO nasal inhaler Use 2 sprays in each nostril daily for 7 days. 16.9 mL 0   gabapentin (NEURONTIN) 400 MG capsule TAKE 2 CAPSULES IN THE MORNING, 2  CAPSULES AT LUNCH AND 3 CAPSULES AT BEDTIME 630 capsule 1   valACYclovir (VALTREX) 1000 MG tablet Take 1 tablet (1,000 mg total) by mouth 2 times daily for 7 days. (Patient not taking: Reported on 10/20/2021) 14 tablet 0   No facility-administered medications prior to visit.    Review of Systems  Review of Systems  Constitutional:  Positive for fatigue and fever.  HENT:  Positive for congestion.   Respiratory:  Positive for cough. Negative for chest tightness, shortness of breath and wheezing.   Cardiovascular: Negative.     Physical Exam  BP 116/78 (BP Location: Left Arm, Cuff Size: Normal)    Pulse (!) 101    Temp (!) 102.3 F (39.1 C) (Oral)    Ht 5\' 5"  (1.651 m)    Wt 194 lb 9.6 oz (88.3 kg)    SpO2 98%    BMI 32.38 kg/m  Physical Exam Constitutional:      General: He is not in acute distress.    Appearance: Normal appearance. He is ill-appearing. He is not toxic-appearing or diaphoretic.  HENT:     Head: Normocephalic and atraumatic.     Mouth/Throat:     Mouth: Mucous membranes are moist.     Pharynx: Oropharynx is clear.  Cardiovascular:     Rate and Rhythm: Regular rhythm. Tachycardia present.     Comments: Regular rhythm, HR 101 Pulmonary:     Effort: Pulmonary effort is normal. No respiratory distress.     Breath sounds: No stridor. No wheezing, rhonchi or rales.     Comments: Diminished t/o. NO resp distress Musculoskeletal:        General: Normal range of motion.  Skin:    General: Skin is warm and dry.  Neurological:     General: No focal deficit present.     Mental Status: He is alert and oriented to person, place, and time. Mental status is at baseline.  Psychiatric:        Mood and Affect: Mood normal.        Behavior: Behavior normal.        Thought Content: Thought content normal.        Judgment: Judgment normal.     Lab Results:  CBC    Component Value Date/Time   WBC 5.3 10/20/2021 1250   RBC 5.36 10/20/2021 1250   HGB 14.5 10/20/2021 1250    HCT 43.2 10/20/2021 1250   PLT 199.0 10/20/2021 1250   MCV 80.6 10/20/2021 1250   MCH 28.4 04/08/2019 1932   MCHC 33.5 10/20/2021 1250   RDW 14.6 10/20/2021 1250   LYMPHSABS 1.9 10/20/2021 1250   MONOABS 0.8 10/20/2021 1250   EOSABS 0.0 10/20/2021 1250   BASOSABS 0.0 10/20/2021 1250    BMET    Component Value Date/Time   NA 138 10/20/2021 1200   K 4.4 10/20/2021 1200   CL 103 10/20/2021 1200   CO2 26 10/20/2021 1200   GLUCOSE 95 10/20/2021 1200  BUN 12 10/20/2021 1200   CREATININE 1.12 10/20/2021 1200   CALCIUM 10.1 10/20/2021 1200   GFRNONAA >60 04/08/2019 1932   GFRAA >60 04/08/2019 1932    BNP No results found for: BNP  ProBNP No results found for: PROBNP  Imaging: No results found.   Assessment & Plan:   CAP (community acquired pneumonia) Patient developed a productive cough on 10/18/21. Dx with suspected CAP at UC, imaging not available. Currently being treated with Augmentin and Azithromycin. He appears well ut is still running low grade fevers. VSS, exam mostly normal. Heart rate regular, 101. Lungs were diminished.  - Continue to take Augmentin and Azithromycin as prescribed - Take Tylenol 1,000mg  every 6 hours (do not exceed 4grams in 24 hours) - Start REGULAR mucinex 1,200mg  twice daily for 1 week (get this over the counter) - REST (no work of 3-5 days) and PUSH oral fluids (clear liquids, avoid caffeine)  - Recommend patient get a pulse oximeter to monitor O2 level and HR. If temp remains elevated with tylenol and/or oxygen <88% and HR >120 need to go to ED  - Checking labs today (including cbc with diff, lactic acid and prolactin level) - POC Covid test was normal today in office (unable to do flu d/t supplies) - Needs CXR in 4-6 weeks  - Follow-up  4 weeks with Eustaquio Maize NP (CXR prior)     Martyn Ehrich, NP 10/24/2021

## 2021-10-24 ENCOUNTER — Encounter: Payer: Self-pay | Admitting: *Deleted

## 2021-10-24 DIAGNOSIS — J189 Pneumonia, unspecified organism: Secondary | ICD-10-CM | POA: Insufficient documentation

## 2021-10-24 NOTE — Telephone Encounter (Signed)
I already wrote back. It was mildly elevated. Its an indication for how sick someone is. How is he feeling? Does he still have a fever? Is he checking his oxygen level at home?

## 2021-10-24 NOTE — Telephone Encounter (Signed)
That is great news, nothing further needed at the time being. Keep me updated if something changes.

## 2021-10-24 NOTE — Telephone Encounter (Signed)
EW please advise on message returned from patient, thanks:   No more fevers, Oxygen is at 97 and 98. I'm feeling much better, I went back to work today.

## 2021-10-24 NOTE — Progress Notes (Signed)
Its just a measure of how sick someone is. His is mildly elevated. How is he feeling? Has he had any more fevers, has he been checking oxygen level at home?

## 2021-10-24 NOTE — Telephone Encounter (Signed)
EW patient is wanting to know about his lab results.  Please advise. Thanks

## 2021-10-24 NOTE — Assessment & Plan Note (Signed)
Patient developed a productive cough on 10/18/21. Dx with suspected CAP at UC, imaging not available. Currently being treated with Augmentin and Azithromycin. He appears well ut is still running low grade fevers. VSS, exam mostly normal. Heart rate regular, 101. Lungs were diminished.  - Continue to take Augmentin and Azithromycin as prescribed - Take Tylenol 1,000mg  every 6 hours (do not exceed 4grams in 24 hours) - Start REGULAR mucinex 1,200mg  twice daily for 1 week (get this over the counter) - REST (no work of 3-5 days) and PUSH oral fluids (clear liquids, avoid caffeine)  - Recommend patient get a pulse oximeter to monitor O2 level and HR. If temp remains elevated with tylenol and/or oxygen <88% and HR >120 need to go to ED  - Checking labs today (including cbc with diff, lactic acid and prolactin level) - POC Covid test was normal today in office (unable to do flu d/t supplies) - Needs CXR in 4-6 weeks  - Follow-up  4 weeks with Eustaquio Maize NP (CXR prior)

## 2021-10-25 LAB — PROCALCITONIN

## 2021-10-25 LAB — LACTIC ACID, PLASMA: LACTIC ACID: 2.2 mmol/L — ABNORMAL HIGH (ref 0.4–1.8)

## 2021-10-25 LAB — EXTRA SPECIMEN

## 2021-11-20 ENCOUNTER — Encounter: Payer: Self-pay | Admitting: Primary Care

## 2021-11-20 ENCOUNTER — Ambulatory Visit (INDEPENDENT_AMBULATORY_CARE_PROVIDER_SITE_OTHER): Payer: BC Managed Care – PPO

## 2021-11-20 ENCOUNTER — Ambulatory Visit (INDEPENDENT_AMBULATORY_CARE_PROVIDER_SITE_OTHER): Payer: BC Managed Care – PPO | Admitting: Primary Care

## 2021-11-20 ENCOUNTER — Other Ambulatory Visit: Payer: Self-pay

## 2021-11-20 VITALS — BP 112/56 | HR 80 | Temp 98.5°F | Ht 66.0 in | Wt 197.2 lb

## 2021-11-20 DIAGNOSIS — J4520 Mild intermittent asthma, uncomplicated: Secondary | ICD-10-CM | POA: Diagnosis not present

## 2021-11-20 DIAGNOSIS — J189 Pneumonia, unspecified organism: Secondary | ICD-10-CM | POA: Diagnosis not present

## 2021-11-20 DIAGNOSIS — J440 Chronic obstructive pulmonary disease with acute lower respiratory infection: Secondary | ICD-10-CM

## 2021-11-20 DIAGNOSIS — J40 Bronchitis, not specified as acute or chronic: Secondary | ICD-10-CM | POA: Diagnosis not present

## 2021-11-20 DIAGNOSIS — J209 Acute bronchitis, unspecified: Secondary | ICD-10-CM

## 2021-11-20 NOTE — Assessment & Plan Note (Addendum)
-   Dx UC on 10/18/21, treated with Augmentin and Azithromycin. Cough has improved and he remains afebrile. Lungs were clear on exam and VSS. Advised he take mucinex only as needed for chest congestion. Repeat CXR today showed no suspicious pulmonary opacity.

## 2021-11-20 NOTE — Progress Notes (Signed)
@Patient  ID: Gregory Fuller, male    DOB: 1981-03-07, 41 y.o.   MRN: [id number]  Chief Complaint  Patient presents with   Follow-up    Patient is here to go over Cxr results. Patient has no complaints.     Referring provider: Delilah Shan, MD  HPI: 41 year old male, never smoked. PMH significant for asthma and neurosarcoidosis. Patient of Dr. Chase Caller, last seen on  08/04/21. Patient had covid in November 2022.   Previous [facility] pulmonary encounter: 10/20/2021 Patient presents today for acute OV/cough. He first noticed bubbling sound when laying and developed productive cough with clear mucus and  2 days ago. Patient went to UC yesterday, covid and flu were negative. CXR showed possible PNA. He was given prescription for Augmentin and azithromycin which he started last night. He had covid in November 2022 and was treated with antiviral. He had Rituxin infusion Monday. When he takes deep breath he starts to cough. No significant shortness of breath or wheezing symptoms. No weight gain or leg swelling.   11/20/2021- Interim hx  Patient presents today for 4 week follow-up. He developed cough on 10/18/21, dx suspected CAP at [facility]. Treated with Augmentin and Azithromycin. Recommended he start regular mucinex 1200mg  twice daily, take Tylenol 1gm q 6 hours for fevers/chills. Covid was negative. He is doing a lot better today. He has a mild residual dry cough. He has been able to go to the gym without coughing. Asthma symptoms are typically well controlled throughout the year. During heavy exercise he may experience dyspnea symptoms. He has not needed to [place] Albuterol rescue inhaler in the past week. No fevers. Needs repeat CXR today.    No Known Allergies  Immunization History  Administered Date(s) Administered   Influenza Split 08/06/2015, 07/16/2017, 08/04/2018, 08/06/2019, 08/08/2020   Influenza,inj,Quad PF,6+ Mos 08/09/2014, 07/06/2016, 07/16/2017, 08/06/2019, 08/08/2020, 08/03/2021    Influenza-Unspecified 08/09/2014, 08/04/2018, 08/06/2019   PFIZER Comirnaty(Gray Top)Covid-19 Tri-Sucrose Vaccine 02/08/2020, 11/17/2020   PFIZER(Purple Top)SARS-COV-2 Vaccination 01/15/2020, 02/08/2020   Pfizer Covid-19 Vaccine Bivalent Booster 36yrs & up 08/23/2021   Pneumococcal Conjugate-13 11/12/2014   Pneumococcal Polysaccharide-23 03/07/2015   Pneumococcal-Unspecified 03/07/2015   Tdap 11/15/2016    Past Medical History:  Diagnosis Date   Asthma    Bronchitis    GERD (gastroesophageal reflux disease)    Sarcoidosis    Subacute transverse myelitis ([place])     Tobacco History: Social History   Tobacco Use  Smoking Status Never  Smokeless Tobacco Never  Tobacco Comments   pt states he smoked socially   Counseling given: Not Answered Tobacco comments: pt states he smoked socially   Outpatient Medications Prior to Visit  Medication Sig Dispense Refill   albuterol (VENTOLIN HFA) 108 (90 Base) MCG/ACT inhaler Inhale 1 puff into the lungs every 4 (four) hours as needed for up to 30 days for Wheezing or Shortness of Breath. 8.5 g 0   ALPRAZolam (XANAX) 0.25 MG tablet TAKE 1 TABLET BY MOUTH AT BEDTIME AS NEEDED 30 tablet 0   amoxicillin-clavulanate (AUGMENTIN) 875-125 MG tablet Take 1 tablet by mouth 2 times daily for 5 days. 10 tablet 0   azithromycin (ZITHROMAX) 250 MG tablet Take 2 tablets (500 mg total) by mouth daily for 1 day, then 1 tablet (250 mg total) daily for 4 days. 6 tablet 0   Cholecalciferol (VITAMIN D3) 3000 units TABS Take 4,000 Units by mouth daily.     docusate sodium (COLACE) 100 MG capsule Take 100 mg by mouth daily  as needed for mild constipation. Reported on 04/10/2016     famotidine (PEPCID) 20 MG tablet Take 1 tablet (20 mg total) by mouth at bedtime as needed for heartburn. 30 tablet 99   fluticasone (FLONASE) 50 MCG/ACT nasal spray Place into the nose.     gabapentin (NEURONTIN) 400 MG capsule Take 2 capsules by mouth in the morning, 2 capsules by mouth  at lunch and 3 capsules by mouth at bedtime 630 capsule 1   gabapentin (NEURONTIN) 400 MG capsule Take 2 capsules in the morning, 2 capsules at lunch and 3 capsules at bedtime 630 capsule 1   ibuprofen (ADVIL,MOTRIN) 200 MG tablet Take 200 mg by mouth as needed for moderate pain.      mupirocin ointment (BACTROBAN) 2 % Apply to affected area 2 times daily. 22 g 0   pantoprazole (PROTONIX) 40 MG tablet Take 1 tablet (40 mg total) by mouth daily. 90 tablet 1   RiTUXimab (RITUXAN IV) Inject 1 each into the vein every 6 (six) months.      triamcinolone (NASACORT) 55 MCG/ACT AERO nasal inhaler Use 2 sprays in each nostril daily for 7 days. 16.9 mL 0   valACYclovir (VALTREX) 1000 MG tablet Take 1 tablet (1,000 mg total) by mouth 2 times daily for 7 days. 14 tablet 0   gabapentin (NEURONTIN) 400 MG capsule TAKE 2 CAPSULES IN THE MORNING, 2 CAPSULES AT LUNCH AND 3 CAPSULES AT BEDTIME 630 capsule 1   No facility-administered medications prior to visit.      Review of Systems  Review of Systems  Constitutional: Negative.   HENT: Negative.    Respiratory:  Positive for cough and shortness of breath. Negative for chest tightness and wheezing.     Physical Exam  BP (!) 112/56 (BP Location: Left Arm, Patient Position: Sitting, Cuff Size: Large)    Pulse 80    Temp 98.5 F (36.9 C) (Oral)    Ht 5\' 6"  (1.676 m)    Wt 197 lb 3.2 oz (89.4 kg)    SpO2 98%    BMI 31.83 kg/m  Physical Exam Constitutional:      Appearance: Normal appearance.  HENT:     Head: Normocephalic and atraumatic.     Mouth/Throat:     Mouth: Mucous membranes are moist.     Pharynx: Oropharynx is clear.  Cardiovascular:     Rate and Rhythm: Normal rate and regular rhythm.  Pulmonary:     Effort: Pulmonary effort is normal.     Breath sounds: Normal breath sounds.  Musculoskeletal:        General: Normal range of motion.  Skin:    General: Skin is warm and dry.  Neurological:     General: No focal deficit present.      Mental Status: He is alert and oriented to person, place, and time. Mental status is at baseline.  Psychiatric:        Mood and Affect: Mood normal.        Behavior: Behavior normal.        Thought Content: Thought content normal.        Judgment: Judgment normal.     Lab Results:  CBC    Component Value Date/Time   WBC 5.3 10/20/2021 1250   RBC 5.36 10/20/2021 1250   HGB 14.5 10/20/2021 1250   HCT 43.2 10/20/2021 1250   PLT 199.0 10/20/2021 1250   MCV 80.6 10/20/2021 1250   MCH 28.4 04/08/2019 1932   MCHC 33.5  10/20/2021 1250   RDW 14.6 10/20/2021 1250   LYMPHSABS 1.9 10/20/2021 1250   MONOABS 0.8 10/20/2021 1250   EOSABS 0.0 10/20/2021 1250   BASOSABS 0.0 10/20/2021 1250    BMET    Component Value Date/Time   NA 138 10/20/2021 1200   K 4.4 10/20/2021 1200   CL 103 10/20/2021 1200   CO2 26 10/20/2021 1200   GLUCOSE 95 10/20/2021 1200   BUN 12 10/20/2021 1200   CREATININE 1.12 10/20/2021 1200   CALCIUM 10.1 10/20/2021 1200   GFRNONAA >60 04/08/2019 1932   GFRAA >60 04/08/2019 1932    BNP No results found for: BNP  ProBNP No results found for: PROBNP  Imaging: DG Chest 2 View  Result Date: 11/20/2021 CLINICAL DATA:  Bronchitis EXAM: CHEST - 2 VIEW COMPARISON:  Chest x-ray 04/08/2019 FINDINGS: Heart size and mediastinal contours are within normal limits. No suspicious pulmonary opacities identified. No pleural effusion or pneumothorax visualized. No acute osseous abnormality appreciated. IMPRESSION: No acute intrathoracic process identified. Electronically Signed   By: Ofilia Neas M.D.   On: 11/20/2021 09:18     Assessment & Plan:   CAP (community acquired pneumonia) - Dx UC on 10/18/21, treated with Augmentin and Azithromycin. Cough has improved and he remains afebrile. Lungs were clear on exam and VSS. Advised he take mucinex only as needed for chest congestion. Repeat CXR today showed no suspicious pulmonary opacity.   Asthma, chronic - Typically  well controlled throughout the year except when acutely ill. NO acute symptoms. Denies sob/wheezing. Continue PRN albuterol 2 puffs every 4-6 hours as needed/    Martyn Ehrich, NP 11/20/2021

## 2021-11-20 NOTE — Patient Instructions (Signed)
CXR today looked alright, I do no see any evidence of active pneumonia You no longer need to take mucinex unless you develop congested cough  Continue albuterol inhaler 2 puffs every 6 hours as needed for breakthrough shortness of breath/wheezing   Follow-up: 6 months with Dr. Chase Caller or sooner

## 2021-11-20 NOTE — Assessment & Plan Note (Addendum)
-   Typically well controlled throughout the year except when acutely ill. NO acute symptoms. Denies sob/wheezing. Continue PRN albuterol 2 puffs every 4-6 hours as needed/

## 2021-11-20 NOTE — Progress Notes (Signed)
Please let patient know CXR today showed no residual opacities/pneumonia. No acute process

## 2021-12-04 ENCOUNTER — Other Ambulatory Visit (HOSPITAL_COMMUNITY): Payer: Self-pay

## 2021-12-08 ENCOUNTER — Other Ambulatory Visit (HOSPITAL_COMMUNITY): Payer: Self-pay

## 2021-12-18 DIAGNOSIS — D869 Sarcoidosis, unspecified: Secondary | ICD-10-CM | POA: Diagnosis not present

## 2021-12-18 DIAGNOSIS — Z6830 Body mass index (BMI) 30.0-30.9, adult: Secondary | ICD-10-CM | POA: Diagnosis not present

## 2022-02-19 DIAGNOSIS — Z6829 Body mass index (BMI) 29.0-29.9, adult: Secondary | ICD-10-CM | POA: Diagnosis not present

## 2022-02-19 DIAGNOSIS — D8689 Sarcoidosis of other sites: Secondary | ICD-10-CM | POA: Diagnosis not present

## 2022-03-08 DIAGNOSIS — J45909 Unspecified asthma, uncomplicated: Secondary | ICD-10-CM | POA: Diagnosis not present

## 2022-03-08 DIAGNOSIS — D171 Benign lipomatous neoplasm of skin and subcutaneous tissue of trunk: Secondary | ICD-10-CM | POA: Diagnosis not present

## 2022-03-09 ENCOUNTER — Other Ambulatory Visit (HOSPITAL_COMMUNITY): Payer: Self-pay

## 2022-03-13 ENCOUNTER — Other Ambulatory Visit (HOSPITAL_COMMUNITY): Payer: Self-pay

## 2022-03-13 DIAGNOSIS — Z23 Encounter for immunization: Secondary | ICD-10-CM | POA: Diagnosis not present

## 2022-03-14 ENCOUNTER — Other Ambulatory Visit (HOSPITAL_COMMUNITY): Payer: Self-pay

## 2022-03-14 MED ORDER — GABAPENTIN 400 MG PO CAPS
ORAL_CAPSULE | ORAL | 1 refills | Status: DC
  Filled 2022-03-14 – 2022-03-17 (×2): qty 210, 30d supply, fill #0
  Filled 2022-04-15: qty 630, 90d supply, fill #1
  Filled 2022-07-15: qty 420, 60d supply, fill #2

## 2022-03-17 ENCOUNTER — Other Ambulatory Visit (HOSPITAL_COMMUNITY): Payer: Self-pay

## 2022-04-16 ENCOUNTER — Other Ambulatory Visit (HOSPITAL_COMMUNITY): Payer: Self-pay

## 2022-04-16 DIAGNOSIS — G0491 Myelitis, unspecified: Secondary | ICD-10-CM | POA: Diagnosis not present

## 2022-04-16 DIAGNOSIS — D8689 Sarcoidosis of other sites: Secondary | ICD-10-CM | POA: Diagnosis not present

## 2022-05-24 ENCOUNTER — Other Ambulatory Visit (HOSPITAL_COMMUNITY): Payer: Self-pay

## 2022-05-24 DIAGNOSIS — M48061 Spinal stenosis, lumbar region without neurogenic claudication: Secondary | ICD-10-CM | POA: Diagnosis not present

## 2022-05-24 DIAGNOSIS — J189 Pneumonia, unspecified organism: Secondary | ICD-10-CM | POA: Diagnosis not present

## 2022-05-24 DIAGNOSIS — Q069 Congenital malformation of spinal cord, unspecified: Secondary | ICD-10-CM | POA: Diagnosis not present

## 2022-05-24 DIAGNOSIS — R911 Solitary pulmonary nodule: Secondary | ICD-10-CM | POA: Diagnosis not present

## 2022-05-24 DIAGNOSIS — R918 Other nonspecific abnormal finding of lung field: Secondary | ICD-10-CM | POA: Diagnosis not present

## 2022-05-24 DIAGNOSIS — M5030 Other cervical disc degeneration, unspecified cervical region: Secondary | ICD-10-CM | POA: Diagnosis not present

## 2022-05-24 DIAGNOSIS — D8689 Sarcoidosis of other sites: Secondary | ICD-10-CM | POA: Diagnosis not present

## 2022-05-24 DIAGNOSIS — D869 Sarcoidosis, unspecified: Secondary | ICD-10-CM | POA: Diagnosis not present

## 2022-05-24 DIAGNOSIS — J341 Cyst and mucocele of nose and nasal sinus: Secondary | ICD-10-CM | POA: Diagnosis not present

## 2022-05-24 MED ORDER — AZITHROMYCIN 500 MG PO TABS: ORAL_TABLET | ORAL | 0 refills | Status: DC

## 2022-05-24 MED ORDER — AZITHROMYCIN 600 MG PO TABS
ORAL_TABLET | ORAL | 0 refills | Status: DC
  Filled 2022-05-24: qty 5, 5d supply, fill #0

## 2022-05-25 ENCOUNTER — Telehealth: Payer: Self-pay | Admitting: Internal Medicine

## 2022-05-25 NOTE — Telephone Encounter (Signed)
Called patient and he informed me that his PCP told him he has PNA in left lung. Patient states he has cough but no mucus production. Patient states he is on zpak. No fever. Got him scheduled to see   TP 06/08/22 11:30  For follow up for PNA. Nothing further needed

## 2022-06-08 ENCOUNTER — Ambulatory Visit (INDEPENDENT_AMBULATORY_CARE_PROVIDER_SITE_OTHER): Payer: BC Managed Care – PPO

## 2022-06-08 ENCOUNTER — Ambulatory Visit (INDEPENDENT_AMBULATORY_CARE_PROVIDER_SITE_OTHER): Payer: BC Managed Care – PPO | Admitting: Adult Health

## 2022-06-08 ENCOUNTER — Other Ambulatory Visit (HOSPITAL_COMMUNITY): Payer: Self-pay

## 2022-06-08 ENCOUNTER — Encounter: Payer: Self-pay | Admitting: Adult Health

## 2022-06-08 VITALS — BP 100/64 | HR 55 | Temp 98.1°F | Ht 66.0 in | Wt 178.0 lb

## 2022-06-08 DIAGNOSIS — J189 Pneumonia, unspecified organism: Secondary | ICD-10-CM

## 2022-06-08 DIAGNOSIS — D869 Sarcoidosis, unspecified: Secondary | ICD-10-CM | POA: Diagnosis not present

## 2022-06-08 DIAGNOSIS — J4520 Mild intermittent asthma, uncomplicated: Secondary | ICD-10-CM | POA: Diagnosis not present

## 2022-06-08 DIAGNOSIS — D8689 Sarcoidosis of other sites: Secondary | ICD-10-CM

## 2022-06-08 MED ORDER — ALBUTEROL SULFATE HFA 108 (90 BASE) MCG/ACT IN AERS
2.0000 | INHALATION_SPRAY | Freq: Four times a day (QID) | RESPIRATORY_TRACT | 6 refills | Status: AC | PRN
  Filled 2022-06-08: qty 13.4, 50d supply, fill #0

## 2022-06-08 NOTE — Progress Notes (Signed)
$'@Patient'V$  ID: Gregory Fuller, male    DOB: Jan 16, 1981, 41 y.o.   MRN: [id number]  Chief Complaint  Patient presents with   Follow-up    Referring provider: Delilah Shan, MD  HPI: 41 year old male followed for asthma and underlying pulmonary sarcoidosis (hilar and mediastinal adenopathy) and neurosarcoidosis, sarcoid myelitis (Followed by [facility] Neuro , Rituxan IV every 6 mon)   TEST/EVENTS :  CT chest November 09, 2020 lungs are clear, Mild mediastinal and hilar adenopathy  PFTs August 27, 2014 was normal lung function FEV1 108%, ratio 90, FVC 100%, DLCO 104%  Neuro Notes -Gregory Fuller   Neurosarcoidosis (probable)/Myelitis:  Gregory Fuller suffers from biopsy-proven sarcoidosis with myelitis as neurological involvement. His neurosarcoidosis presents through recurrent myelitis with the typical 'trident sign" on the spinal cord MRI, that has resolved on consecutive MRI images. However, since we are lacking spinal cord biopsy result, his diagnosis is still "probable neurosarcoidosis".   03/19/2019 : Brain MRI with and w/o contrast, report (COMPARISON: MRI brain 01/09/2018): New peripherally enhancing lesion in the left cerebral peduncle with surrounding T2/FLAIR hyperintensity, which may represent a focal neurosarcoid lesion.Other unchanged foci of abnormal signal in the white matter compared to MRI from 01/09/2018.   03/19/2019 : Cervical spine MRI with and w/o contrast, report: Unchanged abnormal signal in the dorsal cord from C5 to T2 compared to 01/09/2018. No enhancing lesions. 03/19/2019 : Thoracic spine MRI with and w/o contrast, report: No interval change in abnormal signal in dorsal cord from C5 to T2 and T4-T6 when compared to 01/09/2018. No enhancing lesions. 07/07/2020 : Brain MRI with and w/o contrast, report (COMPARISON: Brain MRI 03/19/2019): The previously described rim-enhancing lesion in the left cerebral peduncle has resolved, with trace residual FLAIR signal abnormality noted. Additional  scattered foci of FLAIR signal abnormality in the subcortical and periventricular white matter are unchanged and no new areas of signal abnormality or enhancement are seen. 07/07/2020 : Cervical spine MRI with and w/o contrast, report: (COMPARISON: Brain MRI 03/19/2019): Similar appearance of patchy signal abnormalities in the dorsal cervical cord consistent with neurosarcoidosis. No new or enhancing lesions 07/07/2020 : Thoracic spine MRI with and w/o contrast, report: (COMPARISON: Brain MRI 03/19/2019): Similar appearance of posterior cord signal abnormalities at T5-T7 and partially visualized at T1-T2 "consistent with the diagnosis of neurosarcoidosis. No new or enhancing lesions seen. 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 08/16/2021: Brain MRI with and w/o contrast, radiology report: COMPARISON: Brain MRI 07/07/2020. No specific evidence of neurosarcoidosis. No significant interval change from prior MRI. 08/16/2021: Cervical spine MRI with and w/o contrast, radiology report: Stable patchy signal normality within the dorsal cervical cord from C5-T1. No new or enhancing lesions. COMPARISON: Same day MRI of the brain and thoracic spine, MRI of the cervical spine from 07/07/2020 08/16/2021: Thoracic spine MRI with and w/o contrast, radiology report: -Similar signal normality within the posterior cord at T1-T2 and T4-T7. No new signal abnormalities. Comparison: MRI of the thoracic spine from 07/07/2020. -Interval increase in size of lipomatous lesion within the right latissimus dorsi muscle, consistent with intramuscular lipoma. Given interval increase in size however no other suspicious features recommend physical exam and dedicated imaging/tissue sampling if clinically indicated.    06/08/2022 Follow up : Pneumonia , pulmonary sarcoidosis, asthma Patient returns for a follow-up visit.  Patient says he was recently diagnosed with pneumonia 2 weeks ago.  Patient was  treated with antibiotics with azithromycin for 5 days.  Says he had mild  cough and chills. Was found on routine scans for Sarcoid. He says he is starting to feel better.  Overall says he has been doing better is trying to be more active and exercise.   Care everywhere shows strep pneumoniae antigen and Legionella antigen were negative.  CT chest May 24, 2022 showed new areas of groundglass attenuation in the left upper lobe and right lower lobe and right upper lobe.  Unchanged hilar and mediastinal lymph nodes Patient has mild intermittent asthma.  Is on albuterol inhaler as needed.  Not on maintenance therapy.  Has chronic allergies and is on Flonase daily as needed  Remains active, exercising . Works Biochemist, clinical .   Follows with Neuro at [facility] on Rituxan IV every 6 months   No Known Allergies  Immunization History  Administered Date(s) Administered   Influenza Split 08/06/2015, 07/16/2017, 08/04/2018, 08/06/2019, 08/08/2020   Influenza,inj,Quad PF,6+ Mos 08/09/2014, 07/06/2016, 07/16/2017, 08/06/2019, 08/08/2020, 08/03/2021   Influenza-Unspecified 08/09/2014, 08/06/2015, 08/05/2016, 07/16/2017, 08/04/2018, 08/06/2019, 08/08/2020   PFIZER Comirnaty(Gray Top)Covid-19 Tri-Sucrose Vaccine 02/08/2020, 11/17/2020   PFIZER(Purple Top)SARS-COV-2 Vaccination 01/15/2020, 02/08/2020   Pfizer Covid-19 Vaccine Bivalent Booster 71yr & up 08/23/2021   Pneumococcal Conjugate-13 11/12/2014   Pneumococcal Polysaccharide-23 03/07/2015   Pneumococcal-Unspecified 03/07/2015   Tdap 11/15/2016    Past Medical History:  Diagnosis Date   Asthma    Bronchitis    GERD (gastroesophageal reflux disease)    Sarcoidosis    Subacute transverse myelitis ([facility]     Tobacco History: Social History   Tobacco Use  Smoking Status Never  Smokeless Tobacco Never  Tobacco Comments   pt states he smoked socially   Counseling given: Not Answered Tobacco comments: pt states he smoked socially   Outpatient  Medications Prior to Visit  Medication Sig Dispense Refill   albuterol (VENTOLIN HFA) 108 (90 Base) MCG/ACT inhaler Inhale 1 puff into the lungs every 4 (four) hours as needed for up to 30 days for Wheezing or Shortness of Breath. 8.5 g 0   fluticasone (FLONASE) 50 MCG/ACT nasal spray Place into the nose.     ALPRAZolam (XANAX) 0.25 MG tablet TAKE 1 TABLET BY MOUTH AT BEDTIME AS NEEDED 30 tablet 0   amoxicillin-clavulanate (AUGMENTIN) 875-125 MG tablet Take 1 tablet by mouth 2 times daily for 5 days. (Patient not taking: Reported on 06/08/2022) 10 tablet 0   azithromycin (ZITHROMAX) 250 MG tablet Take 2 tablets (500 mg total) by mouth daily for 1 day, then 1 tablet (250 mg total) daily for 4 days. (Patient not taking: Reported on 06/08/2022) 6 tablet 0   azithromycin (ZITHROMAX) 500 MG tablet Take 1 tablet by mouth daily for 5 days. (Patient not taking: Reported on 06/08/2022) 5 tablet 0   azithromycin (ZITHROMAX) 600 MG tablet Take 1 tablet by mouth daily for 5 days. (Patient not taking: Reported on 06/08/2022) 5 tablet 0   Cholecalciferol (VITAMIN D3) 3000 units TABS Take 4,000 Units by mouth daily.     docusate sodium (COLACE) 100 MG capsule Take 100 mg by mouth daily as needed for mild constipation. Reported on 04/10/2016     gabapentin (NEURONTIN) 400 MG capsule Take 2 capsules by mouth in the morning, 2 capsules at lunch and 3 capsules at bedtime 630 capsule 1   ibuprofen (ADVIL,MOTRIN) 200 MG tablet Take 200 mg by mouth as needed for moderate pain.      mupirocin ointment (BACTROBAN) 2 % Apply to affected area 2 times daily. 22 g 0   pantoprazole (PROTONIX) 40  MG tablet Take 1 tablet (40 mg total) by mouth daily. 90 tablet 1   RiTUXimab (RITUXAN IV) Inject 1 each into the vein every 6 (six) months.      triamcinolone (NASACORT) 55 MCG/ACT AERO nasal inhaler Use 2 sprays in each nostril daily for 7 days. (Patient not taking: Reported on 06/08/2022) 16.9 mL 0   valACYclovir (VALTREX) 1000 MG tablet Take 1  tablet (1,000 mg total) by mouth 2 times daily for 7 days. 14 tablet 0   gabapentin (NEURONTIN) 400 MG capsule TAKE 2 CAPSULES IN THE MORNING, 2 CAPSULES AT LUNCH AND 3 CAPSULES AT BEDTIME 630 capsule 1   gabapentin (NEURONTIN) 400 MG capsule Take 2 capsules by mouth in the morning, 2 capsules by mouth at lunch and 3 capsules by mouth at bedtime 630 capsule 1   No facility-administered medications prior to visit.     Review of Systems:   Constitutional:   No  weight loss, night sweats,  Fevers, chills,  +fatigue, or  lassitude.  HEENT:   No headaches,  Difficulty swallowing,  Tooth/dental problems, or  Sore throat,                No sneezing, itching, ear ache,  +nasal congestion, post nasal drip,   CV:  No chest pain,  Orthopnea, PND, swelling in lower extremities, anasarca, dizziness, palpitations, syncope.   GI  No heartburn, indigestion, abdominal pain, nausea, vomiting, diarrhea, change in bowel habits, loss of appetite, bloody stools.   Resp: .  No chest wall deformity  Skin: no rash or lesions.  GU: no dysuria, change in color of urine, no urgency or frequency.  No flank pain, no hematuria   MS:  No joint pain or swelling.  No decreased range of motion.  No back pain.    Physical Exam  BP 100/64 (BP Location: Left Arm)   Pulse (!) 55   Temp 98.1 F (36.7 C) (Oral)   Ht '5\' 6"'$  (1.676 m)   Wt 178 lb (80.7 kg)   SpO2 100%   BMI 28.73 kg/m   GEN: A/Ox3; pleasant , NAD, well nourished    HEENT:  [place]/AT,  NOSE-clear, THROAT-clear, no lesions, no postnasal drip or exudate noted.   NECK:  Supple w/ fair ROM; no JVD; normal carotid impulses w/o bruits; no thyromegaly or nodules palpated; no lymphadenopathy.    RESP  Clear  P & A; w/o, wheezes/ rales/ or rhonchi. no accessory muscle use, no dullness to percussion  CARD:  RRR, no m/r/g, no peripheral edema, pulses intact, no cyanosis or clubbing.  GI:   Soft & nt; nml bowel sounds; no organomegaly or masses detected.    Musco: Warm bil, no deformities or joint swelling noted.   Neuro: alert, no focal deficits noted.    Skin: Warm, no lesions or rashes    Lab Results:    BNP No results found for: "BNP"  ProBNP No results found for: "PROBNP"  Imaging: No results found.       Latest Ref Rng & Units 08/27/2014    6:19 AM  PFT Results  FVC-Pre L 3.79   FVC-Predicted Pre % 96   FVC-Post L 3.97   FVC-Predicted Post % 100   Pre FEV1/FVC % % 89   Post FEV1/FCV % % 90   FEV1-Pre L 3.38   FEV1-Predicted Pre % 102   FEV1-Post L 3.59   DLCO uncorrected ml/min/mmHg 28.08   DLCO UNC% % 104   DLCO corrected  ml/min/mmHg 28.16   DLCO COR %Predicted % 104   DLVA Predicted % 124   TLC L 5.67   TLC % Predicted % 92   RV % Predicted % 128     No results found for: "NITRICOXIDE"      Assessment & Plan:   Sarcoidosis Pulmonary sarcoidosis with hilar and mediastinal adenopathy, pulmonary sarcoidosis with hilar and mediastinal adenopathy, neurosarcoidosis and sarcoid myelitis followed by [facility] neurology on right toxin IV every 6 months. Patient did get treated recently for a pneumonia.  CT chest showed groundglass opacities in the left upper lobe and right lower and right upper lobe.  Unchanged hilar and mediastinal lymph nodes. Patient went to complete a course of antibiotics and had improvement in clinical symptoms.  Had some mild cough and chills.  We will check chest x-ray today.  Pending those results decide if further imaging is indicated Clinically improved and near baseline. Check PFTs on return  Plan  Patient Instructions   Continue on current regimen  Activity as tolerated.  Albuterol inhaler As needed   Flonase daily As needed   Follow up with Dr. Chase Caller or Devyn Griffing NP in 4-6 months with PFT  Please contact office for sooner follow up if symptoms do not improve or worsen or seek emergency care          Asthma, chronic Mild intermittent asthma.  Continue  with albuterol as needed. PFTs on return  CAP (community acquired pneumonia) Recent left upper lobe and right lower and right upper lobe pneumonia noted on CT scan.  Patient is immunosuppressed.  Clinically improved after antibiotics.  Follow-up chest x-ray today.  Neurosarcoidosis in adult Presumed neurosarcoid, sarcoid myelitis continue follow-up with neurology at [facility].     Rexene Edison, NP 06/08/2022

## 2022-06-08 NOTE — Assessment & Plan Note (Signed)
Pulmonary sarcoidosis with hilar and mediastinal adenopathy, pulmonary sarcoidosis with hilar and mediastinal adenopathy, neurosarcoidosis and sarcoid myelitis followed by [facility] neurology on right toxin IV every 6 months. Patient did get treated recently for a pneumonia.  CT chest showed groundglass opacities in the left upper lobe and right lower and right upper lobe.  Unchanged hilar and mediastinal lymph nodes. Patient went to complete a course of antibiotics and had improvement in clinical symptoms.  Had some mild cough and chills.  We will check chest x-ray today.  Pending those results decide if further imaging is indicated Clinically improved and near baseline. Check PFTs on return  Plan  Patient Instructions   Continue on current regimen  Activity as tolerated.  Albuterol inhaler As needed   Flonase daily As needed   Follow up with Dr. Chase Caller or Azavion Bouillon NP in 4-6 months with PFT  Please contact office for sooner follow up if symptoms do not improve or worsen or seek emergency care

## 2022-06-08 NOTE — Assessment & Plan Note (Signed)
Recent left upper lobe and right lower and right upper lobe pneumonia noted on CT scan.  Patient is immunosuppressed.  Clinically improved after antibiotics.  Follow-up chest x-ray today.

## 2022-06-08 NOTE — Patient Instructions (Addendum)
  Continue on current regimen  Activity as tolerated.  Albuterol inhaler As needed   Flonase daily As needed   Follow up with Dr. Chase Caller or John Williamsen NP in 4-6 months with PFT  Please contact office for sooner follow up if symptoms do not improve or worsen or seek emergency care

## 2022-06-08 NOTE — Assessment & Plan Note (Signed)
Mild intermittent asthma.  Continue with albuterol as needed. PFTs on return

## 2022-06-08 NOTE — Assessment & Plan Note (Signed)
Presumed neurosarcoid, sarcoid myelitis continue follow-up with neurology at [facility].

## 2022-06-11 ENCOUNTER — Telehealth: Payer: Self-pay | Admitting: Adult Health

## 2022-06-11 NOTE — Telephone Encounter (Signed)
Spoke with pt and reviewed CXR results as dictated by Tammy. Pt stated understanding. Nothing further needed at this time. 

## 2022-06-11 NOTE — Telephone Encounter (Signed)
Please refer to phone encounter from 8/7 as this is a duplicate.

## 2022-06-11 NOTE — Telephone Encounter (Signed)
ATC LVMTCB x 1  

## 2022-06-13 ENCOUNTER — Telehealth: Payer: Self-pay | Admitting: Adult Health

## 2022-06-13 NOTE — Telephone Encounter (Signed)
See result note dated 06/13/22- pt has already been given results.

## 2022-07-16 ENCOUNTER — Other Ambulatory Visit (HOSPITAL_COMMUNITY): Payer: Self-pay

## 2022-07-18 ENCOUNTER — Other Ambulatory Visit (HOSPITAL_COMMUNITY): Payer: Self-pay

## 2022-07-27 ENCOUNTER — Other Ambulatory Visit (HOSPITAL_COMMUNITY): Payer: Self-pay

## 2022-07-27 MED ORDER — GABAPENTIN 400 MG PO CAPS
ORAL_CAPSULE | ORAL | 1 refills | Status: DC
  Filled 2022-07-27 – 2022-09-16 (×2): qty 630, 90d supply, fill #0
  Filled 2022-12-07: qty 630, 90d supply, fill #1

## 2022-08-01 ENCOUNTER — Other Ambulatory Visit (HOSPITAL_COMMUNITY): Payer: Self-pay

## 2022-08-01 DIAGNOSIS — G0491 Myelitis, unspecified: Secondary | ICD-10-CM | POA: Diagnosis not present

## 2022-08-01 DIAGNOSIS — D8689 Sarcoidosis of other sites: Secondary | ICD-10-CM | POA: Diagnosis not present

## 2022-08-01 DIAGNOSIS — M792 Neuralgia and neuritis, unspecified: Secondary | ICD-10-CM | POA: Diagnosis not present

## 2022-08-01 DIAGNOSIS — Z6828 Body mass index (BMI) 28.0-28.9, adult: Secondary | ICD-10-CM | POA: Diagnosis not present

## 2022-08-01 DIAGNOSIS — Z23 Encounter for immunization: Secondary | ICD-10-CM | POA: Diagnosis not present

## 2022-08-01 MED ORDER — GABAPENTIN 400 MG PO CAPS: 800.0000 mg | ORAL_CAPSULE | Freq: Three times a day (TID) | ORAL | 1 refills | Status: AC

## 2022-08-22 DIAGNOSIS — Z86010 Personal history of colonic polyps: Secondary | ICD-10-CM | POA: Diagnosis not present

## 2022-08-22 DIAGNOSIS — Z1211 Encounter for screening for malignant neoplasm of colon: Secondary | ICD-10-CM | POA: Diagnosis not present

## 2022-08-29 DIAGNOSIS — D8689 Sarcoidosis of other sites: Secondary | ICD-10-CM | POA: Diagnosis not present

## 2022-08-29 DIAGNOSIS — Z6829 Body mass index (BMI) 29.0-29.9, adult: Secondary | ICD-10-CM | POA: Diagnosis not present

## 2022-09-17 ENCOUNTER — Other Ambulatory Visit (HOSPITAL_COMMUNITY): Payer: Self-pay

## 2022-09-22 ENCOUNTER — Other Ambulatory Visit (HOSPITAL_COMMUNITY): Payer: Self-pay

## 2022-10-08 ENCOUNTER — Encounter: Payer: Self-pay | Admitting: Adult Health

## 2022-10-08 ENCOUNTER — Ambulatory Visit (INDEPENDENT_AMBULATORY_CARE_PROVIDER_SITE_OTHER): Payer: BC Managed Care – PPO | Admitting: Adult Health

## 2022-10-08 ENCOUNTER — Ambulatory Visit (INDEPENDENT_AMBULATORY_CARE_PROVIDER_SITE_OTHER): Payer: BC Managed Care – PPO | Admitting: Internal Medicine

## 2022-10-08 VITALS — BP 110/60 | HR 72 | Temp 98.2°F | Ht 66.0 in | Wt 183.0 lb

## 2022-10-08 DIAGNOSIS — D869 Sarcoidosis, unspecified: Secondary | ICD-10-CM | POA: Diagnosis not present

## 2022-10-08 DIAGNOSIS — J4520 Mild intermittent asthma, uncomplicated: Secondary | ICD-10-CM | POA: Diagnosis not present

## 2022-10-08 DIAGNOSIS — D8689 Sarcoidosis of other sites: Secondary | ICD-10-CM

## 2022-10-08 DIAGNOSIS — J189 Pneumonia, unspecified organism: Secondary | ICD-10-CM | POA: Diagnosis not present

## 2022-10-08 LAB — PULMONARY FUNCTION TEST
DL/VA % pred: 102 %
DL/VA: 4.86 ml/min/mmHg/L
DLCO cor % pred: 85 %
DLCO cor: 23.46 ml/min/mmHg
DLCO unc % pred: 85 %
DLCO unc: 23.46 ml/min/mmHg
FEF 25-75 Post: 4.47 L/sec
FEF 25-75 Pre: 3.67 L/sec
FEF2575-%Change-Post: 21 %
FEF2575-%Pred-Post: 123 %
FEF2575-%Pred-Pre: 101 %
FEV1-%Change-Post: 5 %
FEV1-%Pred-Post: 85 %
FEV1-%Pred-Pre: 81 %
FEV1-Post: 3.18 L
FEV1-Pre: 3.02 L
FEV1FVC-%Change-Post: 1 %
FEV1FVC-%Pred-Pre: 106 %
FEV6-%Change-Post: 3 %
FEV6-%Pred-Post: 81 %
FEV6-%Pred-Pre: 78 %
FEV6-Post: 3.68 L
FEV6-Pre: 3.54 L
FEV6FVC-%Change-Post: 0 %
FEV6FVC-%Pred-Post: 102 %
FEV6FVC-%Pred-Pre: 102 %
FVC-%Change-Post: 3 %
FVC-%Pred-Post: 79 %
FVC-%Pred-Pre: 76 %
FVC-Post: 3.68 L
FVC-Pre: 3.55 L
Post FEV1/FVC ratio: 86 %
Post FEV6/FVC ratio: 100 %
Pre FEV1/FVC ratio: 85 %
Pre FEV6/FVC Ratio: 100 %
RV % pred: 75 %
RV: 1.22 L
TLC % pred: 80 %
TLC: 4.91 L

## 2022-10-08 NOTE — Progress Notes (Signed)
$'@Patient'd$  ID: Gregory Fuller, male    DOB: [birth year], 41 y.o.   MRN: [id number]  Chief Complaint  Patient presents with   Follow-up    Referring provider: Delilah Shan, MD  HPI: 41 year old male followed for asthma and underlying pulmonary sarcoidosis (hilar and mediastinal adenopathy) and neurosarcoidosis, sarcoid myelitis (followed by [facility] neurology on Rituxan IV every 6 months)  TEST/EVENTS :  CT chest November 09, 2020 lungs are clear, Mild mediastinal and hilar adenopathy   PFTs August 27, 2014 was normal lung function FEV1 108%, ratio 90, FVC 100%, DLCO 104%   Neuro Notes -[place]   Neurosarcoidosis (probable)/Myelitis:  [name] suffers from biopsy-proven sarcoidosis with myelitis as neurological involvement. His neurosarcoidosis presents through recurrent myelitis with the typical 'trident sign" on the spinal cord MRI, that has resolved on consecutive MRI images. However, since we are lacking spinal cord biopsy result, his diagnosis is still "probable neurosarcoidosis".    03/19/2019 : Brain MRI with and w/o contrast, report (COMPARISON: MRI brain 01/09/2018): New peripherally enhancing lesion in the left cerebral peduncle with surrounding T2/FLAIR hyperintensity, which may represent a focal neurosarcoid lesion.Other unchanged foci of abnormal signal in the white matter compared to MRI from 01/09/2018.   03/19/2019 : Cervical spine MRI with and w/o contrast, report: Unchanged abnormal signal in the dorsal cord from C5 to T2 compared to 01/09/2018. No enhancing lesions. 03/19/2019 : Thoracic spine MRI with and w/o contrast, report: No interval change in abnormal signal in dorsal cord from C5 to T2 and T4-T6 when compared to 01/09/2018. No enhancing lesions. 07/07/2020 : Brain MRI with and w/o contrast, report (COMPARISON: Brain MRI 03/19/2019): The previously described rim-enhancing lesion in the left cerebral peduncle has resolved, with trace residual FLAIR signal abnormality  noted. Additional scattered foci of FLAIR signal abnormality in the subcortical and periventricular white matter are unchanged and no new areas of signal abnormality or enhancement are seen. 07/07/2020 : Cervical spine MRI with and w/o contrast, report: (COMPARISON: Brain MRI 03/19/2019): Similar appearance of patchy signal abnormalities in the dorsal cervical cord consistent with neurosarcoidosis. No new or enhancing lesions 07/07/2020 : Thoracic spine MRI with and w/o contrast, report: (COMPARISON: Brain MRI 03/19/2019): Similar appearance of posterior cord signal abnormalities at T5-T7 and partially visualized at T1-T2 "consistent with the diagnosis of neurosarcoidosis. No new or enhancing lesions seen. 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 08/16/2021: Brain MRI with and w/o contrast, radiology report: COMPARISON: Brain MRI 07/07/2020. No specific evidence of neurosarcoidosis. No significant interval change from prior MRI. 08/16/2021: Cervical spine MRI with and w/o contrast, radiology report: Stable patchy signal normality within the dorsal cervical cord from C5-T1. No new or enhancing lesions. COMPARISON: Same day MRI of the brain and thoracic spine, MRI of the cervical spine from 07/07/2020 08/16/2021: Thoracic spine MRI with and w/o contrast, radiology report: -Similar signal normality within the posterior cord at T1-T2 and T4-T7. No new signal abnormalities. Comparison: MRI of the thoracic spine from 07/07/2020. -Interval increase in size of lipomatous lesion within the right latissimus dorsi muscle, consistent with intramuscular lipoma. Given interval increase in size however no other suspicious features recommend physical exam and dedicated imaging/tissue sampling if clinically indicated.   10/08/2022 Follow up : Pulmonary sarcoidosis, asthma Patient returns for a 79-monthfollow-up.  Patient says overall since last visit he has been feeling good.  Activity  level is back to baseline.  He denies any increased albuterol use.  Says  he is not really short of breath at all.  Patient says he is active does exercise on a routine basis.  Denies any cough, rash, joint swelling.  Does get yearly eye exams. Continues to follow-up with neurology on Rituxan every 6 months for neurosarcoidosis.  Denies any change in gait.  Feels that his neuropathy has been fairly stable.  Denies any syncope or tremor.  Flu shot and Pneumovax are up-to-date. Patient says he has an upcoming Rituxan infusion later this month.  Says that he has been told this could be his last infusion.  Patient was set up for pulmonary function testing that was done today that shows slightly decreased diffusing capacity and FVC.  FEV 185%, ratio 86, FVC 79%, DLCO at 85%.  Previously DLCO was 104%.  And FVC was 100%. We reviewed his PFTs in detail .      No Known Allergies  Immunization History  Administered Date(s) Administered   Influenza Split 08/06/2015, 07/16/2017, 08/04/2018, 08/06/2019, 08/08/2020   Influenza,inj,Quad PF,6+ Mos 08/09/2014, 07/06/2016, 07/16/2017, 08/06/2019, 08/08/2020, 08/03/2021, 08/01/2022   Influenza-Unspecified 08/09/2014, 08/06/2015, 08/05/2016, 07/16/2017, 08/04/2018, 08/06/2019, 08/08/2020   PFIZER Comirnaty(Gray Top)Covid-19 Tri-Sucrose Vaccine 02/08/2020, 11/17/2020   PFIZER(Purple Top)SARS-COV-2 Vaccination 01/15/2020, 02/08/2020   PNEUMOCOCCAL CONJUGATE-20 03/13/2022   Pfizer Covid-19 Vaccine Bivalent Booster 51yr & up 08/23/2021   Pneumococcal Conjugate-13 11/12/2014   Pneumococcal Polysaccharide-23 03/07/2015   Pneumococcal-Unspecified 03/07/2015   Tdap 11/15/2016    Past Medical History:  Diagnosis Date   Asthma    Bronchitis    GERD (gastroesophageal reflux disease)    Sarcoidosis    [place]     Tobacco History: Social History   Tobacco Use  Smoking Status Never  Smokeless Tobacco Never  Tobacco Comments   pt  states he smoked socially   Counseling given: Not Answered Tobacco comments: pt states he smoked socially   Outpatient Medications Prior to Visit  Medication Sig Dispense Refill   acetaminophen (TYLENOL) 325 MG tablet Take 325 mg by mouth every 6 (six) hours as needed. 2 as needed     albuterol (VENTOLIN HFA) 108 (90 Base) MCG/ACT inhaler Inhale 1 puff into the lungs every 4 (four) hours as needed for up to 30 days for Wheezing or Shortness of Breath. 8.5 g 0   albuterol (VENTOLIN HFA) 108 (90 Base) MCG/ACT inhaler Inhale 2 puffs into the lungs every 6 hours as needed for wheezing or shortness of breath. 8 g 6   Cholecalciferol (VITAMIN D3) 3000 units TABS Take 4,000 Units by mouth daily.     fluticasone (FLONASE) 50 MCG/ACT nasal spray Place 1 spray into both nostrils daily as needed.     gabapentin (NEURONTIN) 400 MG capsule Take 2 capsules (800 mg total) by mouth in the morning, at lunch, and at bedtime. 540 capsule 1   ibuprofen (ADVIL,MOTRIN) 200 MG tablet Take 200 mg by mouth as needed for moderate pain.      pantoprazole (PROTONIX) 40 MG tablet Take 1 tablet (40 mg total) by mouth daily. (Patient taking differently: Take 40 mg by mouth daily as needed.) 90 tablet 1   RiTUXimab (RITUXAN IV) Inject 1 each into the vein every 6 (six) months.      gabapentin (NEURONTIN) 400 MG capsule Take 2 capsules by mouth in the morning, 2 capsules at lunch and 3 capsules at bedtime (Patient not taking: Reported on 10/08/2022) 630 capsule 1   ALPRAZolam (XANAX) 0.25 MG tablet TAKE 1 TABLET BY MOUTH AT BEDTIME AS NEEDED (Patient  not taking: Reported on 10/08/2022) 30 tablet 0   amoxicillin-clavulanate (AUGMENTIN) 875-125 MG tablet Take 1 tablet by mouth 2 times daily for 5 days. (Patient not taking: Reported on 06/08/2022) 10 tablet 0   azithromycin (ZITHROMAX) 250 MG tablet Take 2 tablets (500 mg total) by mouth daily for 1 day, then 1 tablet (250 mg total) daily for 4 days. (Patient not taking: Reported on  06/08/2022) 6 tablet 0   azithromycin (ZITHROMAX) 500 MG tablet Take 1 tablet by mouth daily for 5 days. (Patient not taking: Reported on 06/08/2022) 5 tablet 0   azithromycin (ZITHROMAX) 600 MG tablet Take 1 tablet by mouth daily for 5 days. (Patient not taking: Reported on 06/08/2022) 5 tablet 0   docusate sodium (COLACE) 100 MG capsule Take 100 mg by mouth daily as needed for mild constipation. Reported on 04/10/2016 (Patient not taking: Reported on 10/08/2022)     mupirocin ointment (BACTROBAN) 2 % Apply to affected area 2 times daily. (Patient not taking: Reported on 10/08/2022) 22 g 0   triamcinolone (NASACORT) 55 MCG/ACT AERO nasal inhaler Use 2 sprays in each nostril daily for 7 days. (Patient not taking: Reported on 10/08/2022) 16.9 mL 0   valACYclovir (VALTREX) 1000 MG tablet Take 1 tablet (1,000 mg total) by mouth 2 times daily for 7 days. (Patient not taking: Reported on 10/08/2022) 14 tablet 0   No facility-administered medications prior to visit.     Review of Systems:   Constitutional:   No  weight loss, night sweats,  Fevers, chills, +fatigue, or  lassitude.  HEENT:   No headaches,  Difficulty swallowing,  Tooth/dental problems, or  Sore throat,                No sneezing, itching, ear ache, nasal congestion, post nasal drip,   CV:  No chest pain,  Orthopnea, PND, swelling in lower extremities, anasarca, dizziness, palpitations, syncope.   GI  No heartburn, indigestion, abdominal pain, nausea, vomiting, diarrhea, change in bowel habits, loss of appetite, bloody stools.   Resp: No shortness of breath with exertion or at rest.  No excess mucus, no productive cough,  No non-productive cough,  No coughing up of blood.  No change in color of mucus.  No wheezing.  No chest wall deformity  Skin: no rash or lesions.  GU: no dysuria, change in color of urine, no urgency or frequency.  No flank pain, no hematuria   MS:  No joint pain or swelling.  No decreased range of motion.  No back  pain.    Physical Exam  BP 110/60 (BP Location: Left Arm, Patient Position: Sitting, Cuff Size: Normal)   Pulse 72   Temp 98.2 F (36.8 C) (Oral)   Ht '5\' 6"'$  (1.676 m)   Wt 183 lb (83 kg)   SpO2 100%   BMI 29.54 kg/m   GEN: A/Ox3; pleasant , NAD, well nourished    HEENT:  [place]/AT,   NOSE-clear, THROAT-clear, no lesions, no postnasal drip or exudate noted.   NECK:  Supple w/ fair ROM; no JVD; normal carotid impulses w/o bruits; no thyromegaly or nodules palpated; no lymphadenopathy.    RESP  Clear  P & A; w/o, wheezes/ rales/ or rhonchi. no accessory muscle use, no dullness to percussion  CARD:  RRR, no m/r/g, no peripheral edema, pulses intact, no cyanosis or clubbing.  GI:   Soft & nt; nml bowel sounds; no organomegaly or masses detected.   Musco: Warm bil, no deformities or  joint swelling noted.   Neuro: alert, no focal deficits noted.    Skin: Warm, no lesions or rashes    Lab Results:    BMET   BNP No results found for: "BNP"  ProBNP No results found for: "PROBNP"  Imaging: No results found.       Latest Ref Rng & Units 10/08/2022    9:56 AM 08/27/2014    6:19 AM  PFT Results  FVC-Pre L 3.55  P 3.79   FVC-Predicted Pre % 76  P 96   FVC-Post L 3.68  P 3.97   FVC-Predicted Post % 79  P 100   Pre FEV1/FVC % % 85  P 89   Post FEV1/FCV % % 86  P 90   FEV1-Pre L 3.02  P 3.38   FEV1-Predicted Pre % 81  P 102   FEV1-Post L 3.18  P 3.59   DLCO uncorrected ml/min/mmHg 23.46  P 28.08   DLCO UNC% % 85  P 104   DLCO corrected ml/min/mmHg 23.46  P 28.16   DLCO COR %Predicted % 85  P 104   DLVA Predicted % 102  P 124   TLC L 4.91  P 5.67   TLC % Predicted % 80  P 92   RV % Predicted % 75  P 128     P Preliminary result    No results found for: "NITRICOXIDE"      Assessment & Plan:   Sarcoidosis Pulmonary sarcoidosis with hilar and mediastinal adenopathy .  Multisystem involvement with probable neurosarcoidosis with sarcoid myelitis followed by  [facility] neurology.  Clinically patient appears to be stable.  PFTs do show a slight drop in FVC and DLCO.  Has upcoming Rituxan infusion later this month.  Previous CT scan showed no parenchymal involvement only hilar and mediastinal adenopathy.  We will check high-resolution CT chest since decreased diffusing capacity to make sure no parenchymal involvement. Activity as tolerated flu and pneumonia vaccines are up-to-date  Plan  Patient Instructions  Set up for HRCT Chest .  Continue on current regimen  Activity as tolerated.  Albuterol inhaler As needed   Flonase daily As needed   Follow up with Dr. Chase Caller or Eugenie Harewood NP in 6 months and As needed   Please contact office for sooner follow up if symptoms do not improve or worsen or seek emergency care       Neurosarcoidosis in adult Continue follow-up with [facility] neurology.  Patient is maintained on Rituxan infusions every 6 months.  Has upcoming infusion later this month.  Asthma, chronic Mild intermittent asthma.  Continue with albuterol as needed.  PFTs show no significant obstruction     Rexene Edison, NP 10/08/2022

## 2022-10-08 NOTE — Assessment & Plan Note (Signed)
Mild intermittent asthma.  Continue with albuterol as needed.  PFTs show no significant obstruction

## 2022-10-08 NOTE — Patient Instructions (Addendum)
Set up for HRCT Chest .  Continue on current regimen  Activity as tolerated.  Albuterol inhaler As needed   Flonase daily As needed   Follow up with Dr. Chase Caller or Ande Therrell NP in 6 months and As needed   Please contact office for sooner follow up if symptoms do not improve or worsen or seek emergency care

## 2022-10-08 NOTE — Progress Notes (Signed)
Full PFT performed today. °

## 2022-10-08 NOTE — Assessment & Plan Note (Signed)
Continue follow-up with [facility] neurology.  Patient is maintained on Rituxan infusions every 6 months.  Has upcoming infusion later this month.

## 2022-10-08 NOTE — Assessment & Plan Note (Signed)
Pulmonary sarcoidosis with hilar and mediastinal adenopathy .  Multisystem involvement with probable neurosarcoidosis with sarcoid myelitis followed by [facility] neurology.  Clinically patient appears to be stable.  PFTs do show a slight drop in FVC and DLCO.  Has upcoming Rituxan infusion later this month.  Previous CT scan showed no parenchymal involvement only hilar and mediastinal adenopathy.  We will check high-resolution CT chest since decreased diffusing capacity to make sure no parenchymal involvement. Activity as tolerated flu and pneumonia vaccines are up-to-date  Plan  Patient Instructions  Set up for HRCT Chest .  Continue on current regimen  Activity as tolerated.  Albuterol inhaler As needed   Flonase daily As needed   Follow up with Dr. Chase Caller or Lenon Kuennen NP in 6 months and As needed   Please contact office for sooner follow up if symptoms do not improve or worsen or seek emergency care

## 2022-10-08 NOTE — Patient Instructions (Signed)
Full PFT performed today. °

## 2022-10-15 DIAGNOSIS — G0491 Myelitis, unspecified: Secondary | ICD-10-CM | POA: Diagnosis not present

## 2022-10-15 DIAGNOSIS — R590 Localized enlarged lymph nodes: Secondary | ICD-10-CM | POA: Diagnosis not present

## 2022-10-15 DIAGNOSIS — D8689 Sarcoidosis of other sites: Secondary | ICD-10-CM | POA: Diagnosis not present

## 2022-10-15 DIAGNOSIS — D869 Sarcoidosis, unspecified: Secondary | ICD-10-CM | POA: Diagnosis not present

## 2022-10-16 ENCOUNTER — Ambulatory Visit
Admission: RE | Admit: 2022-10-16 | Discharge: 2022-10-16 | Disposition: A | Discharge status: Home / Self Care | Payer: BC Managed Care – PPO | Source: Ambulatory Visit | Attending: Adult Health | Admitting: Adult Health

## 2022-10-16 DIAGNOSIS — D869 Sarcoidosis, unspecified: Secondary | ICD-10-CM

## 2022-10-16 DIAGNOSIS — R918 Other nonspecific abnormal finding of lung field: Secondary | ICD-10-CM | POA: Diagnosis not present

## 2022-10-23 ENCOUNTER — Encounter: Payer: Self-pay | Admitting: Internal Medicine

## 2022-10-23 ENCOUNTER — Other Ambulatory Visit (HOSPITAL_COMMUNITY): Payer: Self-pay

## 2022-10-23 ENCOUNTER — Encounter: Payer: Self-pay | Admitting: Adult Health

## 2022-10-23 MED ORDER — MOLNUPIRAVIR EUA 200MG CAPSULE
4.0000 | ORAL_CAPSULE | Freq: Two times a day (BID) | ORAL | 0 refills | Status: AC
  Filled 2022-10-23: qty 40, 5d supply, fill #0

## 2022-10-23 MED ORDER — MOLNUPIRAVIR 200 MG PO CAPS
4.0000 | ORAL_CAPSULE | Freq: Two times a day (BID) | ORAL | 0 refills | Status: AC
  Filled 2022-10-23: qty 40, 5d supply, fill #0

## 2022-10-23 NOTE — Telephone Encounter (Signed)
Tested positive 10/22/22 , Sx x 1-2 days nasal congestion and sore throat , fatigue.  Rituxan infusion last week.   Covid vaccine x 3 , no booster in last 6 months  High risk .(Sarcoid)   Recommend Molnupiravir x 5 days  Support care with fluids and rest  Tylenol As needed    Please contact office for sooner follow up if symptoms do not improve or worsen or seek emergency care  Quarantine as discussed .   OV in 2 weeks and As needed  -sooner ov if not improving .   Needs oow note sent to my chart , out of work 11/02/22, may return on 10/30/22.

## 2022-10-23 NOTE — Telephone Encounter (Signed)
Tammy, please advise on pt's message regarding his recent positive covid test. Thanks.

## 2022-10-23 NOTE — Telephone Encounter (Signed)
I called and spoke with the Gregory Fuller  He already had spoken with Tammy and is aware of the plan outlined below from her  I sent in [facility], sent work letter through [employer] and scheduled rov with TP for 11/15/22

## 2022-10-23 NOTE — Telephone Encounter (Signed)
See other pt email.

## 2022-10-24 ENCOUNTER — Other Ambulatory Visit (HOSPITAL_COMMUNITY): Payer: Self-pay

## 2022-11-15 ENCOUNTER — Ambulatory Visit (INDEPENDENT_AMBULATORY_CARE_PROVIDER_SITE_OTHER): Payer: BC Managed Care – PPO | Admitting: Adult Health

## 2022-11-15 ENCOUNTER — Encounter: Payer: Self-pay | Admitting: Adult Health

## 2022-11-15 VITALS — BP 105/60 | HR 53 | Temp 98.2°F | Ht 66.0 in | Wt 189.0 lb

## 2022-11-15 DIAGNOSIS — D869 Sarcoidosis, unspecified: Secondary | ICD-10-CM

## 2022-11-15 DIAGNOSIS — J4520 Mild intermittent asthma, uncomplicated: Secondary | ICD-10-CM

## 2022-11-15 DIAGNOSIS — Z7689 Persons encountering health services in other specified circumstances: Secondary | ICD-10-CM

## 2022-11-15 NOTE — Assessment & Plan Note (Signed)
Mild intermittent asthma appears to be well-controlled.  Albuterol as needed.

## 2022-11-15 NOTE — Patient Instructions (Addendum)
Continue on current regimen  Activity as tolerated.  Albuterol inhaler As needed   Flonase daily As needed   Referral to ophthalmology (sarcoid) Refer to PCP .  Follow up with Dr. Chase Caller or Othell Diluzio NP in 4  months with PFTs  and As needed   Please contact office for sooner follow up if symptoms do not improve or worsen or seek emergency care

## 2022-11-15 NOTE — Progress Notes (Addendum)
@Patient  ID: Rexene Edison, male    DOB: 06-06-1981, 42 y.o.   MRN: [id number]  Chief Complaint  Patient presents with   Follow-up    Referring provider: Wilburn Mylar, MD  HPI: 42 year old male followed for asthma and underlying pulmonary sarcoidosis (hilar and mediastinal adenopathy and neurosarcoidosis, sarcoid myelitis (followed by [facility] neurology on Rituxan  IV every 6 months)  TEST/EVENTS :  CT chest November 09, 2020 lungs are clear, Mild mediastinal and hilar adenopathy   PFTs August 27, 2014 was normal lung function FEV1 108%, ratio 90, FVC 100%, DLCO 104%   Neuro Notes -[place]   Neurosarcoidosis (probable)/Myelitis:  Mr. Halliday suffers from biopsy-proven sarcoidosis with myelitis as neurological involvement. His neurosarcoidosis presents through recurrent myelitis with the typical 'trident sign" on the spinal cord MRI, that has resolved on consecutive MRI images. However, since we are lacking spinal cord biopsy result, his diagnosis is still "probable neurosarcoidosis".    03/19/2019 : Brain MRI with and w/o contrast, report (COMPARISON: MRI brain 01/09/2018): New peripherally enhancing lesion in the left cerebral peduncle with surrounding T2/FLAIR hyperintensity, which may represent a focal neurosarcoid lesion.Other unchanged foci of abnormal signal in the white matter compared to MRI from 01/09/2018.   03/19/2019 : Cervical spine MRI with and w/o contrast, report: Unchanged abnormal signal in the dorsal cord from C5 to T2 compared to 01/09/2018. No enhancing lesions. 03/19/2019 : Thoracic spine MRI with and w/o contrast, report: No interval change in abnormal signal in dorsal cord from C5 to T2 and T4-T6 when compared to 01/09/2018. No enhancing lesions. 07/07/2020 : Brain MRI with and w/o contrast, report (COMPARISON: Brain MRI 03/19/2019): The previously described rim-enhancing lesion in the left cerebral peduncle has resolved, with trace residual FLAIR signal abnormality  noted. Additional scattered foci of FLAIR signal abnormality in the subcortical and periventricular white matter are unchanged and no new areas of signal abnormality or enhancement are seen. 07/07/2020 : Cervical spine MRI with and w/o contrast, report: (COMPARISON: Brain MRI 03/19/2019): Similar appearance of patchy signal abnormalities in the dorsal cervical cord consistent with neurosarcoidosis. No new or enhancing lesions 07/07/2020 : Thoracic spine MRI with and w/o contrast, report: (COMPARISON: Brain MRI 03/19/2019): Similar appearance of posterior cord signal abnormalities at T5-T7 and partially visualized at T1-T2 "consistent with the diagnosis of neurosarcoidosis. No new or enhancing lesions seen. 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 08/16/2021: Brain MRI with and w/o contrast, radiology report: COMPARISON: Brain MRI 07/07/2020. No specific evidence of neurosarcoidosis. No significant interval change from prior MRI. 08/16/2021: Cervical spine MRI with and w/o contrast, radiology report: Stable patchy signal normality within the dorsal cervical cord from C5-T1. No new or enhancing lesions. COMPARISON: Same day MRI of the brain and thoracic spine, MRI of the cervical spine from 07/07/2020 08/16/2021: Thoracic spine MRI with and w/o contrast, radiology report: -Similar signal normality within the posterior cord at T1-T2 and T4-T7. No new signal abnormalities. Comparison: MRI of the thoracic spine from 07/07/2020. -Interval increase in size of lipomatous lesion within the right latissimus dorsi muscle, consistent with intramuscular lipoma. Given interval increase in size however no other suspicious features recommend physical exam and dedicated imaging/tissue sampling if clinically indicated.  10/2022  .pulmonary function testing that was done today that shows slightly decreased diffusing capacity and FVC. FEV 1 85%, ratio 86, FVC 79%, DLCO at 85%. Previously DLCO  was 104%. And FVC was 100%.    11/15/2022 Follow up :  Sarcoid , Asthma  Patient presents for 1 month follow-up.  Patient has underlying sarcoidosis with multisystem involvement including pulmonary and neuro sarcoid.  He is followed by neurology at [facility].  He is on right toxin IV every 6 months.  Last infusion was October 15, 2022.  Last visit patient was set up for PFTs that showed mild restriction with slightly decreased diffusing capacity.  Patient had minimum symptoms with no cough or shortness of breath.  Is active but does not exercise on a routine basis. Patient did have COVID-19 in November 2022.  He also had 2 episodes of pneumonia last being July 2023.  CT chest May 24, 2022 showed new areas of groundglass attenuation in the left upper lobe and right lower and right upper lobe.  And unchanged hilar and mediastinal lymph nodes.  Clinically patient improved after this.  Patient was set up for a repeat CT chest that was done October 16, 2022 that showed a few scattered areas of mild groundglass attenuation bilaterally most evident in the right upper lobe and left lower lobe.  Looking at previous scan in July definitely decreased groundglass attenuation has improved on the left lung.  Patient received right toxin the day before his CT scan and also tested positive for COVID-19 later that week.  Patient says he is fully recovered and only had minimum symptoms with joint pain.  Patient denies any rash, chest pain, orthopnea.  Does have chronic back pain.  Is controlled with Neurontin.  Needs ophthalmology referral for routine eye evaluations. Also would like a referral for new primary care provider.     No Known Allergies  Immunization History  Administered Date(s) Administered   Influenza Split 08/06/2015, 07/16/2017, 08/04/2018, 08/06/2019, 08/08/2020   Influenza,inj,Quad PF,6+ Mos 08/09/2014, 07/06/2016, 07/16/2017, 08/06/2019, 08/08/2020, 08/03/2021, 08/01/2022   Influenza-Unspecified  08/09/2014, 08/06/2015, 08/05/2016, 07/16/2017, 08/04/2018, 08/06/2019, 08/08/2020   PFIZER Comirnaty(Gray Top)Covid-19 Tri-Sucrose Vaccine 02/08/2020, 11/17/2020   PFIZER(Purple Top)SARS-COV-2 Vaccination 01/15/2020, 02/08/2020   PNEUMOCOCCAL CONJUGATE-20 03/13/2022   Pfizer Covid-19 Vaccine Bivalent Booster 59yrs & up 08/23/2021   Pneumococcal Conjugate-13 11/12/2014   Pneumococcal Polysaccharide-23 03/07/2015   Pneumococcal-Unspecified 03/07/2015   Tdap 11/15/2016    Past Medical History:  Diagnosis Date   Asthma    Bronchitis    GERD (gastroesophageal reflux disease)    Sarcoidosis    Subacute transverse myelitis (HCC)     Tobacco History: Social History   Tobacco Use  Smoking Status Never  Smokeless Tobacco Never  Tobacco Comments   pt states he smoked socially   Counseling given: Not Answered Tobacco comments: pt states he smoked socially   Outpatient Medications Prior to Visit  Medication Sig Dispense Refill   acetaminophen (TYLENOL) 325 MG tablet Take 325 mg by mouth every 6 (six) hours as needed. 2 as needed     albuterol (VENTOLIN HFA) 108 (90 Base) MCG/ACT inhaler Inhale 1 puff into the lungs every 4 (four) hours as needed for up to 30 days for Wheezing or Shortness of Breath. 8.5 g 0   albuterol (VENTOLIN HFA) 108 (90 Base) MCG/ACT inhaler Inhale 2 puffs into the lungs every 6 hours as needed for wheezing or shortness of breath. 8 g 6   Cholecalciferol (VITAMIN D3) 3000 units TABS Take 4,000 Units by mouth daily.     fluticasone (FLONASE) 50 MCG/ACT nasal spray Place 1 spray into both nostrils daily as needed.     gabapentin (NEURONTIN) 400 MG capsule Take 2 capsules by mouth in the morning, 2 capsules  at lunch and 3 capsules at bedtime 630 capsule 1   gabapentin (NEURONTIN) 400 MG capsule Take 2 capsules (800 mg total) by mouth in the morning, at lunch, and at bedtime. 540 capsule 1   ibuprofen (ADVIL,MOTRIN) 200 MG tablet Take 200 mg by mouth as needed for  moderate pain.      pantoprazole (PROTONIX) 40 MG tablet Take 1 tablet (40 mg total) by mouth daily. (Patient taking differently: Take 40 mg by mouth daily as needed.) 90 tablet 1   RiTUXimab (RITUXAN IV) Inject 1 each into the vein every 6 (six) months.      No facility-administered medications prior to visit.     Review of Systems:   Constitutional:   No  weight loss, night sweats,  Fevers, chills,  +fatigue, or  lassitude.  HEENT:   No headaches,  Difficulty swallowing,  Tooth/dental problems, or  Sore throat,                No sneezing, itching, ear ache, nasal congestion, post nasal drip,   CV:  No chest pain,  Orthopnea, PND, swelling in lower extremities, anasarca, dizziness, palpitations, syncope.   GI  No heartburn, indigestion, abdominal pain, nausea, vomiting, diarrhea, change in bowel habits, loss of appetite, bloody stools.   Resp:   No chest wall deformity  Skin: no rash or lesions.  GU: no dysuria, change in color of urine, no urgency or frequency.  No flank pain, no hematuria   MS:  No joint pain or swelling.  No decreased range of motion.  No back pain.    Physical Exam  BP 105/60 (BP Location: Left Arm, Patient Position: Sitting, Cuff Size: Large)   Pulse (!) 53   Temp 98.2 F (36.8 C) (Oral)   Ht 5\' 6"  (1.676 m)   Wt 189 lb (85.7 kg)   BMI 30.51 kg/m   GEN: A/Ox3; pleasant , NAD, well nourished    HEENT:  [place]/AT,   NOSE-clear, THROAT-clear, no lesions, no postnasal drip or exudate noted.   NECK:  Supple w/ fair ROM; no JVD; normal carotid impulses w/o bruits; no thyromegaly or nodules palpated; no lymphadenopathy.    RESP  Clear  P & A; w/o, wheezes/ rales/ or rhonchi. no accessory muscle use, no dullness to percussion  CARD:  RRR, no m/r/g, no peripheral edema, pulses intact, no cyanosis or clubbing.  GI:   Soft & nt; nml bowel sounds; no organomegaly or masses detected.   Musco: Warm bil, no deformities or joint swelling noted.   Neuro:  alert, no focal deficits noted.    Skin: Warm, no lesions or rashes    Lab Results:  CBC   BNP No results found for: "BNP"  ProBNP No results found for: "PROBNP"  Imaging: CT Chest High Resolution  Result Date: 10/17/2022 CLINICAL DATA:  42 year old male history of sarcoidosis. EXAM: CT CHEST WITHOUT CONTRAST TECHNIQUE: Multidetector CT imaging of the chest was performed following the standard protocol without intravenous contrast. High resolution imaging of the lungs, as well as inspiratory and expiratory imaging, was performed. RADIATION DOSE REDUCTION: This exam was performed according to the departmental dose-optimization program which includes automated exposure control, adjustment of the mA and/or kV according to patient size and/or use of iterative reconstruction technique. COMPARISON:  Chest CT 11/09/2020. FINDINGS: Cardiovascular: Heart size is normal. There is no significant pericardial fluid, thickening or pericardial calcification. No atherosclerotic calcifications are noted in the thoracic aorta or the coronary arteries. Mediastinum/Nodes: No pathologically  enlarged mediastinal or hilar lymph nodes. Please note that accurate exclusion of hilar adenopathy is limited on noncontrast CT scans. Esophagus is unremarkable in appearance. No axillary lymphadenopathy. Lungs/Pleura: A few scattered areas of peribronchovascular predominant mild ground-glass attenuation are noted throughout the lungs bilaterally, randomly distributed, but most evident in the right upper lobe and superior segment of the left lower lobe. No confluent consolidative airspace disease. No pleural effusions. 3 mm subpleural nodule in the anterior aspect of the left lower lobe abutting the major fissure (axial image 65 of series 8), stable compared to the prior study considered definitively benign. No other larger more suspicious appearing pulmonary nodules or masses are noted. Upper Abdomen: Unremarkable.  Musculoskeletal: There are no aggressive appearing lytic or blastic lesions noted in the visualized portions of the skeleton. IMPRESSION: 1. Randomly distributed regions of peribronchovascular ground-glass attenuation in the lungs bilaterally. This finding is nonspecific, but favored to be of infectious or inflammatory etiology. Electronically Signed   By: Trudie Reed M.D.   On: 10/17/2022 10:51         Latest Ref Rng & Units 10/08/2022    9:56 AM 08/27/2014    6:19 AM  PFT Results  FVC-Pre L 3.55  P 3.79   FVC-Predicted Pre % 76  P 96   FVC-Post L 3.68  P 3.97   FVC-Predicted Post % 79  P 100   Pre FEV1/FVC % % 85  P 89   Post FEV1/FCV % % 86  P 90   FEV1-Pre L 3.02  P 3.38   FEV1-Predicted Pre % 81  P 102   FEV1-Post L 3.18  P 3.59   DLCO uncorrected ml/min/mmHg 23.46  P 28.08   DLCO UNC% % 85  P 104   DLCO corrected ml/min/mmHg 23.46  P 28.16   DLCO COR %Predicted % 85  P 104   DLVA Predicted % 102  P 124   TLC L 4.91  P 5.67   TLC % Predicted % 80  P 92   RV % Predicted % 75  P 128     P Preliminary result    No results found for: "NITRICOXIDE"      Assessment & Plan:   Asthma, chronic Mild intermittent asthma appears to be well-controlled.  Albuterol as needed.  Sarcoidosis Pulmonary sarcoidosis with hilar and mediastinal lymphadenopathy.  Has a multisystem involvement with probable neurosarcoidosis with sarcoid myelitis followed by [facility] neurology.  On Rituxan infusions.  CT chest has shown some groundglass attenuation during acute illness in July.  Has some residual areas on most recent scan in December.  Definitely decreased on the left side.  Possibly related to acute illness.  He has no significant symptoms such as cough or shortness of breath.  Could represent sarcoid parenchymal involvement.  Also was on Rituxan but does not appear to be a drug-induced pneumonitis.. For now clinically he appears to be stable.  Would recommend follow-up PFTs in 4  months.  Could consider a repeat scan in June-as most recent scan was done right Rituxan and also tested positive for COVID-19 Refer to ophthalmology for the eye exams  Plan  Patient Instructions  Continue on current regimen  Activity as tolerated.  Albuterol inhaler As needed   Flonase daily As needed   Referral to ophthalmology (sarcoid) Refer to PCP .  Follow up with Dr. Marchelle Gearing or Geremy Rister NP in 4  months with PFTs  and As needed   Please contact office for  sooner follow up if symptoms do not improve or worsen or seek emergency care        Rexene Edison, NP 11/15/2022

## 2022-11-15 NOTE — Assessment & Plan Note (Signed)
Pulmonary sarcoidosis with hilar and mediastinal lymphadenopathy.  Has a multisystem involvement with probable neurosarcoidosis with sarcoid myelitis followed by [facility] neurology.  On Rituxan infusions.  CT chest has shown some groundglass attenuation during acute illness in July.  Has some residual areas on most recent scan in December.  Definitely decreased on the left side.  Possibly related to acute illness.  He has no significant symptoms such as cough or shortness of breath.  Could represent sarcoid parenchymal involvement.  Also was on Rituxan but does not appear to be a drug-induced pneumonitis.. For now clinically he appears to be stable.  Would recommend follow-up PFTs in 4 months.  Could consider a repeat scan in June-as most recent scan was done right Rituxan and also tested positive for COVID-19 Refer to ophthalmology for the eye exams  Plan  Patient Instructions  Continue on current regimen  Activity as tolerated.  Albuterol inhaler As needed   Flonase daily As needed   Referral to ophthalmology (sarcoid) Refer to PCP .  Follow up with Dr. Chase Caller or Izaiah Tabb NP in 4  months with PFTs  and As needed   Please contact office for sooner follow up if symptoms do not improve or worsen or seek emergency care

## 2022-11-15 NOTE — Addendum Note (Signed)
Addended by: Vanessa Barbara on: 11/15/2022 03:13 PM   Modules accepted: Orders

## 2022-12-07 ENCOUNTER — Encounter (HOSPITAL_COMMUNITY): Payer: Self-pay

## 2022-12-07 ENCOUNTER — Other Ambulatory Visit (HOSPITAL_COMMUNITY): Payer: Self-pay

## 2022-12-07 ENCOUNTER — Other Ambulatory Visit: Payer: Self-pay

## 2022-12-08 ENCOUNTER — Other Ambulatory Visit (HOSPITAL_COMMUNITY): Payer: Self-pay

## 2022-12-12 ENCOUNTER — Other Ambulatory Visit (HOSPITAL_COMMUNITY): Payer: Self-pay

## 2022-12-19 ENCOUNTER — Other Ambulatory Visit (HOSPITAL_COMMUNITY): Payer: Self-pay

## 2022-12-19 DIAGNOSIS — D869 Sarcoidosis, unspecified: Secondary | ICD-10-CM | POA: Diagnosis not present

## 2022-12-19 DIAGNOSIS — H5713 Ocular pain, bilateral: Secondary | ICD-10-CM | POA: Diagnosis not present

## 2022-12-19 MED ORDER — THERATEARS 0.25 % OP SOLN: 2.0000 [drp] | Freq: Four times a day (QID) | OPHTHALMIC | 0 refills | Status: AC | PRN

## 2022-12-20 ENCOUNTER — Other Ambulatory Visit (HOSPITAL_COMMUNITY): Payer: Self-pay

## 2022-12-21 ENCOUNTER — Other Ambulatory Visit (HOSPITAL_COMMUNITY): Payer: Self-pay

## 2023-01-28 DIAGNOSIS — D869 Sarcoidosis, unspecified: Secondary | ICD-10-CM | POA: Diagnosis not present

## 2023-01-28 DIAGNOSIS — H04123 Dry eye syndrome of bilateral lacrimal glands: Secondary | ICD-10-CM | POA: Diagnosis not present

## 2023-03-05 NOTE — Telephone Encounter (Signed)
I have received the following message.   "Good morning, Doc, This is a message from my neurologist, she and I had talked about getting me off rituximab and she is needing your input on this. Could you please reach out to her.    "Hi, the plan from your last visit was "Receive Rituximab in December 2023 and after that we can discontinue if your pulmonology and rheumatology agrees".   Rheumatology per their note did not seem to mind Rituximab discontinuation, but pulmonology note is not saying anything about what they think should you stay on Rituximab.  Please ask your lung doctor what they think and if they are OK with discontinuing Rituximab you soul cancel the next rituximab infusion." Best, Dr Johnnye Lana"   Dr. Marchelle Gearing please advise

## 2023-03-06 NOTE — Telephone Encounter (Signed)
Since the Rituxan was mainly for neuro sarcoid and your pulmonary burden ws minimal I never made a comment about it. But ok to stop Rituxan from my stand point

## 2023-03-12 ENCOUNTER — Other Ambulatory Visit (HOSPITAL_COMMUNITY): Payer: Self-pay

## 2023-03-12 MED ORDER — GABAPENTIN 400 MG PO CAPS
ORAL_CAPSULE | ORAL | 1 refills | Status: DC
  Filled 2023-03-12 – 2023-03-25 (×2): qty 630, 90d supply, fill #0
  Filled 2023-06-10: qty 630, 90d supply, fill #1

## 2023-03-18 NOTE — Telephone Encounter (Signed)
Copy of Dr. Jane Canary recommendations were faxed to pt's Neurologist at pt's request. Nothing further needed at this time.

## 2023-03-20 ENCOUNTER — Other Ambulatory Visit (HOSPITAL_COMMUNITY): Payer: Self-pay

## 2023-03-25 ENCOUNTER — Other Ambulatory Visit (HOSPITAL_COMMUNITY): Payer: Self-pay

## 2023-04-09 ENCOUNTER — Ambulatory Visit: Payer: BC Managed Care – PPO | Admitting: Adult Health

## 2023-04-17 DIAGNOSIS — D8689 Sarcoidosis of other sites: Secondary | ICD-10-CM | POA: Diagnosis not present

## 2023-04-18 ENCOUNTER — Encounter: Payer: Self-pay | Admitting: Adult Health

## 2023-04-18 ENCOUNTER — Ambulatory Visit (INDEPENDENT_AMBULATORY_CARE_PROVIDER_SITE_OTHER): Payer: BC Managed Care – PPO | Admitting: Internal Medicine

## 2023-04-18 ENCOUNTER — Ambulatory Visit (INDEPENDENT_AMBULATORY_CARE_PROVIDER_SITE_OTHER): Payer: BC Managed Care – PPO | Admitting: Adult Health

## 2023-04-18 VITALS — BP 104/60 | HR 56 | Temp 97.0°F | Ht 66.5 in | Wt 187.2 lb

## 2023-04-18 DIAGNOSIS — D869 Sarcoidosis, unspecified: Secondary | ICD-10-CM

## 2023-04-18 DIAGNOSIS — J4520 Mild intermittent asthma, uncomplicated: Secondary | ICD-10-CM

## 2023-04-18 LAB — PULMONARY FUNCTION TEST
DL/VA % pred: 131 %
DL/VA: 6.19 ml/min/mmHg/L
DLCO cor % pred: 106 %
DLCO cor: 29.74 ml/min/mmHg
DLCO unc % pred: 106 %
DLCO unc: 29.74 ml/min/mmHg
FEF 25-75 Post: 4.49 L/sec
FEF 25-75 Pre: 3.5 L/sec
FEF2575-%Change-Post: 28 %
FEF2575-%Pred-Post: 122 %
FEF2575-%Pred-Pre: 95 %
FEV1-%Change-Post: 5 %
FEV1-%Pred-Post: 84 %
FEV1-%Pred-Pre: 79 %
FEV1-Post: 3.19 L
FEV1-Pre: 3.02 L
FEV1FVC-%Change-Post: 4 %
FEV1FVC-%Pred-Pre: 104 %
FEV6-%Change-Post: 1 %
FEV6-%Pred-Post: 79 %
FEV6-%Pred-Pre: 78 %
FEV6-Post: 3.67 L
FEV6-Pre: 3.61 L
FEV6FVC-%Pred-Post: 102 %
FEV6FVC-%Pred-Pre: 102 %
FVC-%Change-Post: 1 %
FVC-%Pred-Post: 77 %
FVC-%Pred-Pre: 76 %
FVC-Post: 3.67 L
FVC-Pre: 3.61 L
Post FEV1/FVC ratio: 87 %
Post FEV6/FVC ratio: 100 %
Pre FEV1/FVC ratio: 84 %
Pre FEV6/FVC Ratio: 100 %
RV % pred: 101 %
RV: 1.67 L
TLC % pred: 90 %
TLC: 5.64 L

## 2023-04-18 NOTE — Progress Notes (Signed)
Full PFT performed today. °

## 2023-04-18 NOTE — Patient Instructions (Signed)
Continue on current regimen  Activity as tolerated.  Albuterol inhaler As needed   Flonase daily As needed   Follow up with Dr. Marchelle Gearing in 6 months and As needed   Please contact office for sooner follow up if symptoms do not improve or worsen or seek emergency care

## 2023-04-18 NOTE — Progress Notes (Signed)
@Patient  ID: Gregory Fuller, male    DOB: [birth year], 42 y.o.   MRN: [id number]  Chief Complaint  Patient presents with   Follow-up    Referring provider: Wilburn Mylar, MD  HPI: 42 year old male followed for asthma and pulmonary sarcoidosis (hilar and mediastinal adenopathy), has multisystem involvement with neurosarcoidosis and sarcoid myelitis followed by [facility] neurology previously on Rituxan (stopped 10/2022)   TEST/EVENTS :  CT chest November 09, 2020 lungs are clear, Mild mediastinal and hilar adenopathy  High-resolution CT chest October 16, 2022 scattered groundglass attenuation findings felt to be nonspecific.  No enlarged mediastinal or hilar adenopathy.   PFTs August 27, 2014 was normal lung function FEV1 108%, ratio 90, FVC 100%, DLCO 104%   Neuro Notes -[place]   Neurosarcoidosis (probable)/Myelitis:  [name] suffers from biopsy-proven sarcoidosis with myelitis as neurological involvement. His neurosarcoidosis presents through recurrent myelitis with the typical 'trident sign" on the spinal cord MRI, that has resolved on consecutive MRI images. However, since we are lacking spinal cord biopsy result, his diagnosis is still "probable neurosarcoidosis".    03/19/2019 : Brain MRI with and w/o contrast, report (COMPARISON: MRI brain 01/09/2018): New peripherally enhancing lesion in the left cerebral peduncle with surrounding T2/FLAIR hyperintensity, which may represent a focal neurosarcoid lesion.Other unchanged foci of abnormal signal in the white matter compared to MRI from 01/09/2018.   03/19/2019 : Cervical spine MRI with and w/o contrast, report: Unchanged abnormal signal in the dorsal cord from C5 to T2 compared to 01/09/2018. No enhancing lesions. 03/19/2019 : Thoracic spine MRI with and w/o contrast, report: No interval change in abnormal signal in dorsal cord from C5 to T2 and T4-T6 when compared to 01/09/2018. No enhancing lesions. 07/07/2020 : Brain MRI with and  w/o contrast, report (COMPARISON: Brain MRI 03/19/2019): The previously described rim-enhancing lesion in the left cerebral peduncle has resolved, with trace residual FLAIR signal abnormality noted. Additional scattered foci of FLAIR signal abnormality in the subcortical and periventricular white matter are unchanged and no new areas of signal abnormality or enhancement are seen. 07/07/2020 : Cervical spine MRI with and w/o contrast, report: (COMPARISON: Brain MRI 03/19/2019): Similar appearance of patchy signal abnormalities in the dorsal cervical cord consistent with neurosarcoidosis. No new or enhancing lesions 07/07/2020 : Thoracic spine MRI with and w/o contrast, report: (COMPARISON: Brain MRI 03/19/2019): Similar appearance of posterior cord signal abnormalities at T5-T7 and partially visualized at T1-T2 "consistent with the diagnosis of neurosarcoidosis. No new or enhancing lesions seen. 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 08/16/2021: Brain MRI with and w/o contrast, radiology report: COMPARISON: Brain MRI 07/07/2020. No specific evidence of neurosarcoidosis. No significant interval change from prior MRI. 08/16/2021: Cervical spine MRI with and w/o contrast, radiology report: Stable patchy signal normality within the dorsal cervical cord from C5-T1. No new or enhancing lesions. COMPARISON: Same day MRI of the brain and thoracic spine, MRI of the cervical spine from 07/07/2020 08/16/2021: Thoracic spine MRI with and w/o contrast, radiology report: -Similar signal normality within the posterior cord at T1-T2 and T4-T7. No new signal abnormalities. Comparison: MRI of the thoracic spine from 07/07/2020. -Interval increase in size of lipomatous lesion within the right latissimus dorsi muscle, consistent with intramuscular lipoma. Given interval increase in size however no other suspicious features recommend physical exam and dedicated imaging/tissue sampling if  clinically indicated.   10/2022  .pulmonary function testing that was done today that shows slightly decreased diffusing capacity and  FVC. FEV 1 85%, ratio 86, FVC 79%, DLCO at 85%. Previously DLCO was 104%. And FVC was 100%.    04/18/2023 Follow up ; Sarcoid and Asthma  Patient presents for a 60-month follow-up.  Patient has underlying pulmonary sarcoidosis with multisystem involvement including pulmonary and neuro sarcoid.  He is followed by neurology at [facility].  Previously on Rituxan , last infusion 10/2022. Neurology has stopped as he has been doing well. Has MRI later this year.  Patient says he has been doing well since his last visit.  He feels good.  Remains active.  Has had no increased joint pain or swelling.  No rash.  No cough . Gets yearly eye exams.  PFTs today showed stable lung function with FEV1 at 84%, ratio 87, FVC 77%, DLCO 106%, no significant bronchodilator response. Says he has several upcoming trips this summer.  No Known Allergies  Immunization History  Administered Date(s) Administered   Influenza Split 08/06/2015, 07/16/2017, 08/04/2018, 08/06/2019, 08/08/2020   Influenza,inj,Quad PF,6+ Mos 08/09/2014, 07/06/2016, 07/16/2017, 08/06/2019, 08/08/2020, 08/03/2021, 08/01/2022   Influenza-Unspecified 08/09/2014, 08/06/2015, 08/05/2016, 07/16/2017, 08/04/2018, 08/06/2019, 08/08/2020   PFIZER Comirnaty(Gray Top)Covid-19 Tri-Sucrose Vaccine 02/08/2020, 11/17/2020   PFIZER(Purple Top)SARS-COV-2 Vaccination 01/15/2020, 02/08/2020   PNEUMOCOCCAL CONJUGATE-20 03/13/2022   Pfizer Covid-19 Vaccine Bivalent Booster 69yrs & up 08/23/2021   Pneumococcal Conjugate-13 11/12/2014   Pneumococcal Polysaccharide-23 03/07/2015   Pneumococcal-Unspecified 03/07/2015   Tdap 11/15/2016    Past Medical History:  Diagnosis Date   Asthma    Bronchitis    GERD (gastroesophageal reflux disease)    Sarcoidosis    Subacute transverse myelitis (HCC)     Tobacco History: Social History    Tobacco Use  Smoking Status Never  Smokeless Tobacco Never  Tobacco Comments   pt states he smoked socially   Counseling given: Not Answered Tobacco comments: pt states he smoked socially   Outpatient Medications Prior to Visit  Medication Sig Dispense Refill   acetaminophen (TYLENOL) 325 MG tablet Take 325 mg by mouth every 6 (six) hours as needed. 2 as needed     albuterol (VENTOLIN HFA) 108 (90 Base) MCG/ACT inhaler Inhale 1 puff into the lungs every 4 (four) hours as needed for up to 30 days for Wheezing or Shortness of Breath. 8.5 g 0   albuterol (VENTOLIN HFA) 108 (90 Base) MCG/ACT inhaler Inhale 2 puffs into the lungs every 6 hours as needed for wheezing or shortness of breath. 8 g 6   Carboxymethylcellulose Sodium (THERATEARS) 0.25 % SOLN Place 2 drops into both eyes 4 (four) times daily as needed. 30 mL 0   Cholecalciferol (VITAMIN D3) 3000 units TABS Take 4,000 Units by mouth daily.     fluticasone (FLONASE) 50 MCG/ACT nasal spray Place 1 spray into both nostrils daily as needed.     gabapentin (NEURONTIN) 400 MG capsule Take 2 capsules (800 mg total) by mouth in the morning, at lunch, and at bedtime. 540 capsule 1   gabapentin (NEURONTIN) 400 MG capsule Take 2 capsules by mouth in the morning, 2 capsules at lunch and 3 capsules at bedtime 630 capsule 1   ibuprofen (ADVIL,MOTRIN) 200 MG tablet Take 200 mg by mouth as needed for moderate pain.      pantoprazole (PROTONIX) 40 MG tablet Take 1 tablet (40 mg total) by mouth daily. (Patient taking differently: Take 40 mg by mouth daily as needed.) 90 tablet 1   RiTUXimab (RITUXAN IV) Inject 1 each into the vein every 6 (six) months.  No facility-administered medications prior to visit.     Review of Systems:   Constitutional:   No  weight loss, night sweats,  Fevers, chills, fatigue, or  lassitude.  HEENT:   No headaches,  Difficulty swallowing,  Tooth/dental problems, or  Sore throat,                No sneezing,  itching, ear ache, nasal congestion, post nasal drip,   CV:  No chest pain,  Orthopnea, PND, swelling in lower extremities, anasarca, dizziness, palpitations, syncope.   GI  No heartburn, indigestion, abdominal pain, nausea, vomiting, diarrhea, change in bowel habits, loss of appetite, bloody stools.   Resp: No shortness of breath with exertion or at rest.  No excess mucus, no productive cough,  No non-productive cough,  No coughing up of blood.  No change in color of mucus.  No wheezing.  No chest wall deformity  Skin: no rash or lesions.  GU: no dysuria, change in color of urine, no urgency or frequency.  No flank pain, no hematuria   MS:  No joint pain or swelling.  No decreased range of motion.  No back pain.    Physical Exam  BP 104/60 (BP Location: Left Arm, Patient Position: Sitting, Cuff Size: Large)   Pulse (!) 56   Temp (!) 97 F (36.1 C) (Temporal)   Ht 5' 6.5" (1.689 m)   Wt 187 lb 3.2 oz (84.9 kg)   SpO2 100%   BMI 29.76 kg/m   GEN: A/Ox3; pleasant , NAD, well nourished    HEENT:  [place]/AT,   NOSE-clear, THROAT-clear, no lesions, no postnasal drip or exudate noted.   NECK:  Supple w/ fair ROM; no JVD; normal carotid impulses w/o bruits; no thyromegaly or nodules palpated; no lymphadenopathy.    RESP  Clear  P & A; w/o, wheezes/ rales/ or rhonchi. no accessory muscle use, no dullness to percussion  CARD:  RRR, no m/r/g, no peripheral edema, pulses intact, no cyanosis or clubbing.  GI:   Soft & nt; nml bowel sounds; no organomegaly or masses detected.   Musco: Warm bil, no deformities or joint swelling noted.   Neuro: alert, no focal deficits noted.    Skin: Warm, no lesions or rashes    Lab Results:  CBC    Component Value Date/Time   WBC 5.3 10/20/2021 1250   RBC 5.36 10/20/2021 1250   HGB 14.5 10/20/2021 1250   HCT 43.2 10/20/2021 1250   PLT 199.0 10/20/2021 1250   MCV 80.6 10/20/2021 1250   MCH 28.4 04/08/2019 1932   MCHC 33.5 10/20/2021 1250    RDW 14.6 10/20/2021 1250   LYMPHSABS 1.9 10/20/2021 1250   MONOABS 0.8 10/20/2021 1250   EOSABS 0.0 10/20/2021 1250   BASOSABS 0.0 10/20/2021 1250    BMET    Component Value Date/Time   NA 138 10/20/2021 1200   K 4.4 10/20/2021 1200   CL 103 10/20/2021 1200   CO2 26 10/20/2021 1200   GLUCOSE 95 10/20/2021 1200   BUN 12 10/20/2021 1200   CREATININE 1.12 10/20/2021 1200   CALCIUM 10.1 10/20/2021 1200   GFRNONAA >60 04/08/2019 1932   GFRAA >60 04/08/2019 1932    BNP No results found for: "BNP"  ProBNP No results found for: "PROBNP"  Imaging: No results found.       Latest Ref Rng & Units 04/18/2023    9:48 AM 10/08/2022    9:56 AM 08/27/2014    6:19 AM  PFT Results  FVC-Pre L 3.61  P 3.55  3.79   FVC-Predicted Pre % 76  P 76  96   FVC-Post L 3.67  P 3.68  3.97   FVC-Predicted Post % 77  P 79  100   Pre FEV1/FVC % % 84  P 85  89   Post FEV1/FCV % % 87  P 86  90   FEV1-Pre L 3.02  P 3.02  3.38   FEV1-Predicted Pre % 79  P 81  102   FEV1-Post L 3.19  P 3.18  3.59   DLCO uncorrected ml/min/mmHg 29.74  P 23.46  28.08   DLCO UNC% % 106  P 85  104   DLCO corrected ml/min/mmHg 29.74  P 23.46  28.16   DLCO COR %Predicted % 106  P 85  104   DLVA Predicted % 131  P 102  124   TLC L 5.64  P 4.91  5.67   TLC % Predicted % 90  P 80  92   RV % Predicted % 101  P 75  128     P Preliminary result    No results found for: "NITRICOXIDE"      Assessment & Plan:   Sarcoidosis Pulmonary sarcoidosis with previous hilar and mediastinal adenopathy.  CT chest in December showed no significant adenopathy.  Some mild groundglass attenuation noted.  PFTs today show stable lung function.  Patient clinically is doing well.  Will see back in 6 months.  Continue with yearly eye exams.  Does have multisystem involvement follows with neurology for probable neurosarcoidosis with sarcoid myelitis.  Has been doing well and is now off of Rituxan   Plan  Patient Instructions  Continue on  current regimen  Activity as tolerated.  Albuterol inhaler As needed   Flonase daily As needed   Follow up with Dr. Marchelle Gearing in 6 months and As needed   Please contact office for sooner follow up if symptoms do not improve or worsen or seek emergency care     Asthma, chronic Mild intermittent asthma appears to be well-controlled.  Control for triggers.  Albuterol as needed     Rubye Oaks, NP 04/18/2023

## 2023-04-18 NOTE — Assessment & Plan Note (Signed)
Pulmonary sarcoidosis with previous hilar and mediastinal adenopathy.  CT chest in December showed no significant adenopathy.  Some mild groundglass attenuation noted.  PFTs today show stable lung function.  Patient clinically is doing well.  Will see back in 6 months.  Continue with yearly eye exams.  Does have multisystem involvement follows with neurology for probable neurosarcoidosis with sarcoid myelitis.  Has been doing well and is now off of Rituxan   Plan  Patient Instructions  Continue on current regimen  Activity as tolerated.  Albuterol inhaler As needed   Flonase daily As needed   Follow up with Dr. Marchelle Gearing in 6 months and As needed   Please contact office for sooner follow up if symptoms do not improve or worsen or seek emergency care

## 2023-04-18 NOTE — Assessment & Plan Note (Signed)
Mild intermittent asthma appears to be well-controlled.  Control for triggers.  Albuterol as needed

## 2023-04-18 NOTE — Patient Instructions (Signed)
Full PFT performed today. °

## 2023-06-10 ENCOUNTER — Other Ambulatory Visit (HOSPITAL_COMMUNITY): Payer: Self-pay

## 2023-06-13 ENCOUNTER — Other Ambulatory Visit (HOSPITAL_COMMUNITY): Payer: Self-pay

## 2023-06-27 DIAGNOSIS — Z8042 Family history of malignant neoplasm of prostate: Secondary | ICD-10-CM | POA: Diagnosis not present

## 2023-06-27 DIAGNOSIS — Z13220 Encounter for screening for lipoid disorders: Secondary | ICD-10-CM | POA: Diagnosis not present

## 2023-06-27 DIAGNOSIS — K4090 Unilateral inguinal hernia, without obstruction or gangrene, not specified as recurrent: Secondary | ICD-10-CM | POA: Diagnosis not present

## 2023-06-27 DIAGNOSIS — Z125 Encounter for screening for malignant neoplasm of prostate: Secondary | ICD-10-CM | POA: Diagnosis not present

## 2023-06-27 DIAGNOSIS — Z0000 Encounter for general adult medical examination without abnormal findings: Secondary | ICD-10-CM | POA: Diagnosis not present

## 2023-07-04 DIAGNOSIS — D8689 Sarcoidosis of other sites: Secondary | ICD-10-CM | POA: Diagnosis not present

## 2023-07-04 DIAGNOSIS — M4802 Spinal stenosis, cervical region: Secondary | ICD-10-CM | POA: Diagnosis not present

## 2023-07-04 DIAGNOSIS — N281 Cyst of kidney, acquired: Secondary | ICD-10-CM | POA: Diagnosis not present

## 2023-07-04 DIAGNOSIS — M47812 Spondylosis without myelopathy or radiculopathy, cervical region: Secondary | ICD-10-CM | POA: Diagnosis not present

## 2023-08-14 DIAGNOSIS — Z23 Encounter for immunization: Secondary | ICD-10-CM | POA: Diagnosis not present

## 2023-09-03 DIAGNOSIS — D860 Sarcoidosis of lung: Secondary | ICD-10-CM | POA: Diagnosis not present

## 2023-09-03 DIAGNOSIS — K4090 Unilateral inguinal hernia, without obstruction or gangrene, not specified as recurrent: Secondary | ICD-10-CM | POA: Diagnosis not present

## 2023-09-15 ENCOUNTER — Other Ambulatory Visit (HOSPITAL_COMMUNITY): Payer: Self-pay

## 2023-09-18 ENCOUNTER — Other Ambulatory Visit (HOSPITAL_COMMUNITY): Payer: Self-pay

## 2023-09-18 MED ORDER — GABAPENTIN 400 MG PO CAPS
ORAL_CAPSULE | ORAL | 1 refills | Status: DC
  Filled 2023-09-18: qty 540, 90d supply, fill #0
  Filled 2023-12-07: qty 540, 90d supply, fill #1

## 2023-09-19 DIAGNOSIS — Z79620 Long term (current) use of immunosuppressive biologic: Secondary | ICD-10-CM | POA: Diagnosis not present

## 2023-09-19 DIAGNOSIS — Z5181 Encounter for therapeutic drug level monitoring: Secondary | ICD-10-CM | POA: Diagnosis not present

## 2023-09-19 DIAGNOSIS — G0491 Myelitis, unspecified: Secondary | ICD-10-CM | POA: Diagnosis not present

## 2023-09-25 ENCOUNTER — Other Ambulatory Visit (HOSPITAL_COMMUNITY): Payer: Self-pay

## 2023-10-23 DIAGNOSIS — D8689 Sarcoidosis of other sites: Secondary | ICD-10-CM | POA: Diagnosis not present

## 2023-10-23 DIAGNOSIS — G0491 Myelitis, unspecified: Secondary | ICD-10-CM | POA: Diagnosis not present

## 2023-11-20 DIAGNOSIS — K4090 Unilateral inguinal hernia, without obstruction or gangrene, not specified as recurrent: Secondary | ICD-10-CM | POA: Diagnosis not present

## 2023-11-20 DIAGNOSIS — Z01818 Encounter for other preprocedural examination: Secondary | ICD-10-CM | POA: Diagnosis not present

## 2023-11-20 DIAGNOSIS — M792 Neuralgia and neuritis, unspecified: Secondary | ICD-10-CM | POA: Diagnosis not present

## 2023-11-20 DIAGNOSIS — J4520 Mild intermittent asthma, uncomplicated: Secondary | ICD-10-CM | POA: Diagnosis not present

## 2023-11-20 DIAGNOSIS — D869 Sarcoidosis, unspecified: Secondary | ICD-10-CM | POA: Diagnosis not present

## 2023-12-02 DIAGNOSIS — Z9109 Other allergy status, other than to drugs and biological substances: Secondary | ICD-10-CM | POA: Diagnosis not present

## 2023-12-02 DIAGNOSIS — Z87891 Personal history of nicotine dependence: Secondary | ICD-10-CM | POA: Diagnosis not present

## 2023-12-02 DIAGNOSIS — F419 Anxiety disorder, unspecified: Secondary | ICD-10-CM | POA: Diagnosis not present

## 2023-12-02 DIAGNOSIS — Z7951 Long term (current) use of inhaled steroids: Secondary | ICD-10-CM | POA: Diagnosis not present

## 2023-12-02 DIAGNOSIS — Z79899 Other long term (current) drug therapy: Secondary | ICD-10-CM | POA: Diagnosis not present

## 2023-12-02 DIAGNOSIS — J45909 Unspecified asthma, uncomplicated: Secondary | ICD-10-CM | POA: Diagnosis not present

## 2023-12-02 DIAGNOSIS — K4090 Unilateral inguinal hernia, without obstruction or gangrene, not specified as recurrent: Secondary | ICD-10-CM | POA: Diagnosis not present

## 2023-12-07 ENCOUNTER — Other Ambulatory Visit (HOSPITAL_COMMUNITY): Payer: Self-pay

## 2023-12-20 ENCOUNTER — Other Ambulatory Visit (HOSPITAL_COMMUNITY): Payer: Self-pay

## 2023-12-20 ENCOUNTER — Ambulatory Visit (HOSPITAL_BASED_OUTPATIENT_CLINIC_OR_DEPARTMENT_OTHER): Payer: Self-pay | Admitting: Internal Medicine

## 2023-12-20 ENCOUNTER — Telehealth: Payer: Self-pay | Admitting: Internal Medicine

## 2023-12-20 DIAGNOSIS — J069 Acute upper respiratory infection, unspecified: Secondary | ICD-10-CM | POA: Diagnosis not present

## 2023-12-20 MED ORDER — PSEUDOEPH-BROMPHEN-DM 30-2-10 MG/5ML PO SYRP
ORAL_SOLUTION | ORAL | 0 refills | Status: AC
Start: 2023-12-20 — End: ?
  Filled 2023-12-20: qty 120, 6d supply, fill #0

## 2023-12-20 NOTE — Telephone Encounter (Signed)
From MyChart scheduling. There are no Acute appointments available for today:   Coughing up mucus, heart rate may be elevated. Raspy sounds when I'm taking a deep breath.

## 2023-12-20 NOTE — Telephone Encounter (Signed)
Fredric Mare transferred patient who reported coughing up mucous and an elevated heart rate.  She advised that there are no same day pulmonary appointments available.    At time of triage, his heart rate was 78 per his [employer] watch.  It briefly reached 100 and went down to 81 when he sat up out of bed.    The patient has had a productive cough, chest tightness, and a runny nose, and upper back pain due to coughing for 2-3 days.  He has been taking over the counter Mucinex and Tylenol for symptom relief.  The patient hears a raspy sound when he breaths in.  He has a history of asthma and sarcoidosis.  He reported chest tightness that is 2-3/10 and relieved with use of his rescue inhaler.  He had a telehealth visit with his pcp this morning regarding these symptoms and was prescribed brompheniramine-pseudoephedrine-DM 2-30-10 mg/5 mL syrup  and was advised to take it in combination with Mucinex and advised to use his albuterol every 4 hours as needed.  His temperature at time of triage was 100.   The patient reported that he had pneumonia the year before last and he is concerned that this may progress.  He said he does not feel as bad as he did then and he thinks it's a common cold.  He was advised to call back Monday if symptoms do not improve.  He was advised to go to urgent care over the weekend if his symptoms worsen.  Routed to pulmonary to advise regarding further treatment options.    Reason for Disposition  [1] Continuous (nonstop) coughing interferes with work or school AND [2] no improvement using cough treatment per Care Advice  Answer Assessment - Initial Assessment Questions 1. ONSET: "When did the cough begin?"      2 days 2. SEVERITY: "How bad is the cough today?"      Comes and goes throughout the day; worse at night  3. SPUTUM: "Describe the color of your sputum" (none, dry cough; clear, white, yellow, green)     Yellow  4. HEMOPTYSIS: "Are you coughing up any blood?" If so ask: "How  much?" (flecks, streaks, tablespoons, etc.)     None 5. DIFFICULTY BREATHING: "Are you having difficulty breathing?" If Yes, ask: "How bad is it?" (e.g., mild, moderate, severe)    - MILD: No SOB at rest, mild SOB with walking, speaks normally in sentences, can lie down, no retractions, pulse < 100.    - MODERATE: SOB at rest, SOB with minimal exertion and prefers to sit, cannot lie down flat, speaks in phrases, mild retractions, audible wheezing, pulse 100-120.    - SEVERE: Very SOB at rest, speaks in single words, struggling to breathe, sitting hunched forward, retractions, pulse > 120      No shortness of breath  6. FEVER: "Do you have a fever?" If Yes, ask: "What is your temperature, how was it measured, and when did it start?"     No  8. LUNG HISTORY: "Do you have any history of lung disease?"  (e.g., pulmonary embolus, asthma, emphysema)     Asthma, Sarcoidosis  10. OTHER SYMPTOMS: "Do you have any other symptoms?" (e.g., runny nose, wheezing, chest pain)       Chest feels tight - uncomfortable at times possibly from coughing 2-3/10 and back is hurting, top is uncomfortable Used rescue inhaler and relieved chest tightness  felt like he couldn't get warm, extremities Tuesday or Wednesday and cough started  the next day Runny nose  Protocols used: Cough - Acute Productive-A-AH

## 2023-12-20 NOTE — Telephone Encounter (Signed)
LOV in 05/2023 Needs to be seen , if no ov will need to go to urgent care or ER as tachycardia and Sx could be Pneumonia   Can work into my schedule next week if needed can double book

## 2023-12-20 NOTE — Telephone Encounter (Signed)
NA. Lmtcb.

## 2023-12-20 NOTE — Telephone Encounter (Signed)
I called the pt and there was no answer- LMTCB. ?

## 2023-12-20 NOTE — Telephone Encounter (Signed)
Please advise

## 2023-12-20 NOTE — Telephone Encounter (Signed)
Patient called back- transferred to nurse triage

## 2023-12-22 ENCOUNTER — Other Ambulatory Visit (HOSPITAL_COMMUNITY): Payer: Self-pay

## 2023-12-22 DIAGNOSIS — J40 Bronchitis, not specified as acute or chronic: Secondary | ICD-10-CM | POA: Diagnosis not present

## 2023-12-22 MED ORDER — AZITHROMYCIN 250 MG PO TABS
ORAL_TABLET | ORAL | 0 refills | Status: AC
  Filled 2023-12-22: qty 6, 5d supply, fill #0

## 2023-12-22 MED ORDER — PREDNISONE 20 MG PO TABS
ORAL_TABLET | ORAL | 0 refills | Status: DC
  Filled 2023-12-22: qty 10, 5d supply, fill #0

## 2023-12-23 ENCOUNTER — Other Ambulatory Visit (HOSPITAL_COMMUNITY): Payer: Self-pay

## 2023-12-23 NOTE — Telephone Encounter (Signed)
ATC x2, LVM to return call.

## 2023-12-24 DIAGNOSIS — Z9889 Other specified postprocedural states: Secondary | ICD-10-CM | POA: Diagnosis not present

## 2023-12-24 DIAGNOSIS — Z8719 Personal history of other diseases of the digestive system: Secondary | ICD-10-CM | POA: Diagnosis not present

## 2023-12-24 DIAGNOSIS — Z09 Encounter for follow-up examination after completed treatment for conditions other than malignant neoplasm: Secondary | ICD-10-CM | POA: Diagnosis not present

## 2023-12-24 NOTE — Telephone Encounter (Signed)
Closing per protocol 

## 2024-01-14 DIAGNOSIS — R509 Fever, unspecified: Secondary | ICD-10-CM | POA: Diagnosis not present

## 2024-01-14 DIAGNOSIS — U071 COVID-19: Secondary | ICD-10-CM | POA: Diagnosis not present

## 2024-01-14 DIAGNOSIS — R051 Acute cough: Secondary | ICD-10-CM | POA: Diagnosis not present

## 2024-01-28 DIAGNOSIS — D869 Sarcoidosis, unspecified: Secondary | ICD-10-CM | POA: Diagnosis not present

## 2024-01-28 DIAGNOSIS — H5213 Myopia, bilateral: Secondary | ICD-10-CM | POA: Diagnosis not present

## 2024-01-28 DIAGNOSIS — H04123 Dry eye syndrome of bilateral lacrimal glands: Secondary | ICD-10-CM | POA: Diagnosis not present

## 2024-02-26 ENCOUNTER — Other Ambulatory Visit (HOSPITAL_COMMUNITY): Payer: Self-pay

## 2024-02-27 ENCOUNTER — Other Ambulatory Visit (HOSPITAL_COMMUNITY): Payer: Self-pay

## 2024-02-27 MED ORDER — GABAPENTIN 400 MG PO CAPS
800.0000 mg | ORAL_CAPSULE | Freq: Three times a day (TID) | ORAL | 1 refills | Status: AC
  Filled 2024-02-27: qty 540, 90d supply, fill #0
  Filled 2024-06-17 – 2024-07-08 (×4): qty 540, 90d supply, fill #1
  Filled ????-??-??: fill #1

## 2024-02-29 ENCOUNTER — Other Ambulatory Visit (HOSPITAL_COMMUNITY): Payer: Self-pay

## 2024-04-16 ENCOUNTER — Other Ambulatory Visit (HOSPITAL_COMMUNITY): Payer: Self-pay

## 2024-04-16 DIAGNOSIS — R051 Acute cough: Secondary | ICD-10-CM | POA: Diagnosis not present

## 2024-04-16 DIAGNOSIS — R41840 Attention and concentration deficit: Secondary | ICD-10-CM | POA: Diagnosis not present

## 2024-04-16 DIAGNOSIS — J4521 Mild intermittent asthma with (acute) exacerbation: Secondary | ICD-10-CM | POA: Diagnosis not present

## 2024-04-16 MED ORDER — BENZONATATE 100 MG PO CAPS
100.0000 mg | ORAL_CAPSULE | Freq: Three times a day (TID) | ORAL | 0 refills | Status: AC | PRN
  Filled 2024-04-16: qty 30, 5d supply, fill #0

## 2024-04-16 MED ORDER — BUDESONIDE-FORMOTEROL FUMARATE 80-4.5 MCG/ACT IN AERO
2.0000 | INHALATION_SPRAY | Freq: Four times a day (QID) | RESPIRATORY_TRACT | 11 refills | Status: AC
  Filled 2024-04-16: qty 10.2, 30d supply, fill #0
  Filled 2024-12-07: qty 10.2, 30d supply, fill #1

## 2024-04-16 MED ORDER — ALBUTEROL SULFATE HFA 108 (90 BASE) MCG/ACT IN AERS
INHALATION_SPRAY | RESPIRATORY_TRACT | 1 refills | Status: AC
  Filled 2024-04-16: qty 6.7, 30d supply, fill #0
  Filled 2024-12-04: qty 6.7, 30d supply, fill #1

## 2024-04-22 ENCOUNTER — Other Ambulatory Visit (HOSPITAL_COMMUNITY): Payer: Self-pay

## 2024-04-22 DIAGNOSIS — D8689 Sarcoidosis of other sites: Secondary | ICD-10-CM | POA: Diagnosis not present

## 2024-04-22 DIAGNOSIS — E559 Vitamin D deficiency, unspecified: Secondary | ICD-10-CM | POA: Diagnosis not present

## 2024-04-22 DIAGNOSIS — M792 Neuralgia and neuritis, unspecified: Secondary | ICD-10-CM | POA: Diagnosis not present

## 2024-04-22 DIAGNOSIS — G0491 Myelitis, unspecified: Secondary | ICD-10-CM | POA: Diagnosis not present

## 2024-04-22 MED ORDER — GABAPENTIN 400 MG PO CAPS
ORAL_CAPSULE | ORAL | 0 refills | Status: DC
  Filled 2024-04-22 – 2024-05-02 (×3): qty 210, 49d supply, fill #0
  Filled 2024-05-14: qty 210, 147d supply, fill #0
  Filled 2024-05-23: qty 112, 63d supply, fill #0
  Filled 2024-05-23: qty 98, 84d supply, fill #0

## 2024-04-23 ENCOUNTER — Other Ambulatory Visit (HOSPITAL_COMMUNITY): Payer: Self-pay

## 2024-05-01 ENCOUNTER — Other Ambulatory Visit (HOSPITAL_COMMUNITY): Payer: Self-pay

## 2024-05-02 ENCOUNTER — Other Ambulatory Visit (HOSPITAL_COMMUNITY): Payer: Self-pay

## 2024-05-14 ENCOUNTER — Other Ambulatory Visit (HOSPITAL_COMMUNITY): Payer: Self-pay

## 2024-05-23 ENCOUNTER — Other Ambulatory Visit (HOSPITAL_COMMUNITY): Payer: Self-pay

## 2024-06-10 DIAGNOSIS — M47812 Spondylosis without myelopathy or radiculopathy, cervical region: Secondary | ICD-10-CM | POA: Diagnosis not present

## 2024-06-10 DIAGNOSIS — M48061 Spinal stenosis, lumbar region without neurogenic claudication: Secondary | ICD-10-CM | POA: Diagnosis not present

## 2024-06-10 DIAGNOSIS — D8689 Sarcoidosis of other sites: Secondary | ICD-10-CM | POA: Diagnosis not present

## 2024-06-10 DIAGNOSIS — R9082 White matter disease, unspecified: Secondary | ICD-10-CM | POA: Diagnosis not present

## 2024-06-17 ENCOUNTER — Other Ambulatory Visit (HOSPITAL_COMMUNITY): Payer: Self-pay

## 2024-06-17 DIAGNOSIS — J4520 Mild intermittent asthma, uncomplicated: Secondary | ICD-10-CM | POA: Diagnosis not present

## 2024-06-17 DIAGNOSIS — Z13220 Encounter for screening for lipoid disorders: Secondary | ICD-10-CM | POA: Diagnosis not present

## 2024-06-17 DIAGNOSIS — E559 Vitamin D deficiency, unspecified: Secondary | ICD-10-CM | POA: Diagnosis not present

## 2024-06-17 DIAGNOSIS — Z0000 Encounter for general adult medical examination without abnormal findings: Secondary | ICD-10-CM | POA: Diagnosis not present

## 2024-06-17 DIAGNOSIS — Z113 Encounter for screening for infections with a predominantly sexual mode of transmission: Secondary | ICD-10-CM | POA: Diagnosis not present

## 2024-06-30 ENCOUNTER — Other Ambulatory Visit (HOSPITAL_COMMUNITY): Payer: Self-pay

## 2024-07-01 DIAGNOSIS — D869 Sarcoidosis, unspecified: Secondary | ICD-10-CM | POA: Diagnosis not present

## 2024-07-09 ENCOUNTER — Other Ambulatory Visit (HOSPITAL_COMMUNITY): Payer: Self-pay

## 2024-07-20 ENCOUNTER — Other Ambulatory Visit (HOSPITAL_COMMUNITY): Payer: Self-pay

## 2024-07-22 ENCOUNTER — Other Ambulatory Visit (HOSPITAL_COMMUNITY): Payer: Self-pay

## 2024-07-24 ENCOUNTER — Other Ambulatory Visit (HOSPITAL_COMMUNITY): Payer: Self-pay

## 2024-07-29 ENCOUNTER — Other Ambulatory Visit (HOSPITAL_COMMUNITY): Payer: Self-pay

## 2024-08-03 ENCOUNTER — Other Ambulatory Visit (HOSPITAL_COMMUNITY): Payer: Self-pay

## 2024-08-03 MED ORDER — ALPRAZOLAM 0.25 MG PO TABS
0.2500 mg | ORAL_TABLET | Freq: Once | ORAL | 0 refills | Status: DC
  Filled 2024-08-03: qty 60, 30d supply, fill #0

## 2024-08-04 ENCOUNTER — Other Ambulatory Visit (HOSPITAL_COMMUNITY): Payer: Self-pay

## 2024-08-06 ENCOUNTER — Other Ambulatory Visit (HOSPITAL_COMMUNITY): Payer: Self-pay

## 2024-08-13 ENCOUNTER — Other Ambulatory Visit (HOSPITAL_COMMUNITY): Payer: Self-pay

## 2024-08-28 ENCOUNTER — Other Ambulatory Visit (HOSPITAL_COMMUNITY): Payer: Self-pay

## 2024-08-28 DIAGNOSIS — R051 Acute cough: Secondary | ICD-10-CM | POA: Diagnosis not present

## 2024-08-28 DIAGNOSIS — Z8709 Personal history of other diseases of the respiratory system: Secondary | ICD-10-CM | POA: Diagnosis not present

## 2024-08-28 MED ORDER — BUDESONIDE-FORMOTEROL FUMARATE 80-4.5 MCG/ACT IN AERO
2.0000 | INHALATION_SPRAY | Freq: Every morning | RESPIRATORY_TRACT | 0 refills | Status: AC
  Filled 2024-08-28: qty 10.2, 30d supply, fill #0

## 2024-08-28 MED ORDER — AZITHROMYCIN 250 MG PO TABS
ORAL_TABLET | ORAL | 0 refills | Status: DC
  Filled 2024-08-28: qty 6, 5d supply, fill #0

## 2024-08-28 MED ORDER — DEXAMETHASONE 6 MG PO TABS
6.0000 mg | ORAL_TABLET | Freq: Every morning | ORAL | 0 refills | Status: DC
  Filled 2024-08-28: qty 5, 5d supply, fill #0

## 2024-09-04 ENCOUNTER — Other Ambulatory Visit (HOSPITAL_COMMUNITY): Payer: Self-pay

## 2024-09-04 MED ORDER — FLUZONE 0.5 ML IM SUSY
0.5000 mL | PREFILLED_SYRINGE | Freq: Once | INTRAMUSCULAR | 0 refills | Status: AC
  Filled 2024-09-04: qty 0.5, 1d supply, fill #0

## 2024-10-04 ENCOUNTER — Other Ambulatory Visit (HOSPITAL_COMMUNITY): Payer: Self-pay

## 2024-10-06 ENCOUNTER — Other Ambulatory Visit (HOSPITAL_COMMUNITY): Payer: Self-pay

## 2024-11-11 ENCOUNTER — Other Ambulatory Visit (HOSPITAL_COMMUNITY): Payer: Self-pay

## 2024-11-11 MED ORDER — GABAPENTIN 400 MG PO CAPS
400.0000 mg | ORAL_CAPSULE | Freq: Three times a day (TID) | ORAL | 1 refills | Status: AC
  Filled 2024-11-11: qty 270, 90d supply, fill #0

## 2024-11-13 ENCOUNTER — Other Ambulatory Visit (HOSPITAL_COMMUNITY): Payer: Self-pay

## 2024-11-19 ENCOUNTER — Ambulatory Visit

## 2024-11-19 ENCOUNTER — Encounter: Payer: Self-pay | Admitting: Adult Health

## 2024-11-19 ENCOUNTER — Ambulatory Visit: Admitting: Adult Health

## 2024-11-19 VITALS — BP 138/84 | HR 56 | Temp 98.2°F | Ht 66.0 in | Wt 194.8 lb

## 2024-11-19 DIAGNOSIS — R059 Cough, unspecified: Secondary | ICD-10-CM

## 2024-11-19 DIAGNOSIS — J4520 Mild intermittent asthma, uncomplicated: Secondary | ICD-10-CM

## 2024-11-19 DIAGNOSIS — D869 Sarcoidosis, unspecified: Secondary | ICD-10-CM

## 2024-11-19 DIAGNOSIS — J310 Chronic rhinitis: Secondary | ICD-10-CM

## 2024-11-19 DIAGNOSIS — R0982 Postnasal drip: Secondary | ICD-10-CM | POA: Diagnosis not present

## 2024-11-19 NOTE — Patient Instructions (Addendum)
 Continue on current regimen  Activity as tolerated.  Albuterol  inhaler As needed   Restart Symbicort  2 puffs Twice daily  if cough is not resolving .  Claritin 10mg  At bedtime As needed   Flonase  daily As needed   Chest xray today  Delsym 2 tsp Twice daily As needed   Pepcid  20mg  At bedtime  for 2 weeks and then As needed   Follow up with Dr. Geronimo in 1 year and As needed   Please contact office for sooner follow up if symptoms do not improve or worsen or seek emergency care

## 2024-11-19 NOTE — Progress Notes (Signed)
 "  @Patient  ID: Gregory Fuller, male    DOB: [birth year], 43 y.o.   MRN: [id number]  Chief Complaint  Patient presents with   Medical Management of Chronic Issues    Asthma/sarcoidosis    Referring provider: No ref. provider found  HPI: 44 year old male followed for asthma and pulmonary sarcoidosis (hilar and mediastinal adenopathy), multisystem involvement with neurosarcoidosis, sarcoid myelitis followed by [facility] neurology previously on Rituxan  stopped in December 2023    TEST/EVENTS : Reviewed 11/19/2024  CT chest November 09, 2020 lungs are clear, Mild mediastinal and hilar adenopathy   High-resolution CT chest October 16, 2022 scattered groundglass attenuation findings felt to be nonspecific.  No enlarged mediastinal or hilar adenopathy.   PFTs August 27, 2014 was normal lung function FEV1 108%, ratio 90, FVC 100%, DLCO 104%   Neuro Notes -[place]   Neurosarcoidosis (probable)/Myelitis:  Mr. Pietila suffers from biopsy-proven sarcoidosis with myelitis as neurological involvement. His neurosarcoidosis presents through recurrent myelitis with the typical 'trident sign on the spinal cord MRI, that has resolved on consecutive MRI images. However, since we are lacking spinal cord biopsy result, his diagnosis is still probable neurosarcoidosis.    03/19/2019 : Brain MRI with and w/o contrast, report (COMPARISON: MRI brain 01/09/2018): New peripherally enhancing lesion in the left cerebral peduncle with surrounding T2/FLAIR hyperintensity, which may represent a focal neurosarcoid lesion.Other unchanged foci of abnormal signal in the white matter compared to MRI from 01/09/2018.   03/19/2019 : Cervical spine MRI with and w/o contrast, report: Unchanged abnormal signal in the dorsal cord from C5 to T2 compared to 01/09/2018. No enhancing lesions. 03/19/2019 : Thoracic spine MRI with and w/o contrast, report: No interval change in abnormal signal in dorsal cord from C5 to T2 and T4-T6 when  compared to 01/09/2018. No enhancing lesions. 07/07/2020 : Brain MRI with and w/o contrast, report (COMPARISON: Brain MRI 03/19/2019): The previously described rim-enhancing lesion in the left cerebral peduncle has resolved, with trace residual FLAIR signal abnormality noted. Additional scattered foci of FLAIR signal abnormality in the subcortical and periventricular white matter are unchanged and no new areas of signal abnormality or enhancement are seen. 07/07/2020 : Cervical spine MRI with and w/o contrast, report: (COMPARISON: Brain MRI 03/19/2019): Similar appearance of patchy signal abnormalities in the dorsal cervical cord consistent with neurosarcoidosis. No new or enhancing lesions 07/07/2020 : Thoracic spine MRI with and w/o contrast, report: (COMPARISON: Brain MRI 03/19/2019): Similar appearance of posterior cord signal abnormalities at T5-T7 and partially visualized at T1-T2 consistent with the diagnosis of neurosarcoidosis. No new or enhancing lesions seen. 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 08/16/2021: Brain MRI with and w/o contrast, radiology report: COMPARISON: Brain MRI 07/07/2020. No specific evidence of neurosarcoidosis. No significant interval change from prior MRI. 08/16/2021: Cervical spine MRI with and w/o contrast, radiology report: Stable patchy signal normality within the dorsal cervical cord from C5-T1. No new or enhancing lesions. COMPARISON: Same day MRI of the brain and thoracic spine, MRI of the cervical spine from 07/07/2020 08/16/2021: Thoracic spine MRI with and w/o contrast, radiology report: -Similar signal normality within the posterior cord at T1-T2 and T4-T7. No new signal abnormalities. Comparison: MRI of the thoracic spine from 07/07/2020. -Interval increase in size of lipomatous lesion within the right latissimus dorsi muscle, consistent with intramuscular lipoma. Given interval increase in size however no other suspicious  features recommend physical exam and dedicated imaging/tissue sampling if clinically indicated.   10/2022  .pulmonary  function testing that was done today that shows slightly decreased diffusing capacity and FVC. FEV 1 85%, ratio 86, FVC 79%, DLCO at 85%. Previously DLCO was 104%. And FVC was 100%.  Discussed the use of AI scribe software for clinical note transcription with the patient, who gave verbal consent to proceed.  History of Present Illness Gregory Fuller is a 44 year old male with sarcoidosis who presents for a routine checkup.  He has sarcoidosis with stable breathing and infrequent use of albuterol . A flare-up in the fall was managed with Symbicort  and antibiotics for bronchitis, resolving the symptoms. He is no longer on Symbicort .  He has a history of neurosarcoidosis and has been off Rituxan  for over two years. An MRI in August of the previous year showed no changes in the spots previously observed, with another MRI scheduled for August this year. He feels good and stays active.  He experiences a cough at night and slight hoarseness, starting two to three days ago. He uses Flonase  and a humidifier at night but has not taken Claritin recently. No discolored sputum, fever, rashes, joint swelling, or other significant symptoms. He recalls recent indigestion.  He is currently using Flonase  and has albuterol  available for asthma management.     Allergies[1]  Immunization History  Administered Date(s) Administered   Influenza Split 08/06/2015, 07/16/2017, 08/04/2018, 08/06/2019, 08/08/2020   Influenza, Seasonal, Injecte, Preservative Fre 09/04/2024   Influenza,inj,Quad PF,6+ Mos 08/09/2014, 07/06/2016, 07/16/2017, 08/06/2019, 08/08/2020, 08/03/2021, 08/01/2022   Influenza-Unspecified 08/09/2014, 08/06/2015, 08/05/2016, 07/16/2017, 08/04/2018, 08/06/2019, 08/08/2020   PFIZER Comirnaty(Gray Top)Covid-19 Tri-Sucrose Vaccine 02/08/2020, 11/17/2020   PFIZER(Purple Top)SARS-COV-2  Vaccination 01/15/2020, 02/08/2020   PNEUMOCOCCAL CONJUGATE-20 03/13/2022   Pfizer Covid-19 Vaccine Bivalent Booster 70yrs & up 08/23/2021   Pneumococcal Conjugate-13 11/12/2014   Pneumococcal Polysaccharide-23 03/07/2015   Pneumococcal-Unspecified 03/07/2015   Tdap 11/15/2016    Past Medical History:  Diagnosis Date   Asthma    Bronchitis    GERD (gastroesophageal reflux disease)    Sarcoidosis    Subacute transverse myelitis (HCC)     Tobacco History: Tobacco Use History[2] Counseling given: Not Answered   Outpatient Medications Prior to Visit  Medication Sig Dispense Refill   acetaminophen  (TYLENOL ) 325 MG tablet Take 325 mg by mouth every 6 (six) hours as needed. 2 as needed     albuterol  (VENTOLIN  HFA) 108 (90 Base) MCG/ACT inhaler Inhale 2 puffs into the lungs every 6 hours as needed for wheezing or shortness of breath. 8 g 6   artificial tears ophthalmic solution Place 1 drop into both eyes as needed.     budesonide -formoterol  (SYMBICORT ) 80-4.5 MCG/ACT inhaler Inhale 2 puffs into the lungs in the morning and 2 puffs before bedtime (Patient taking differently: Inhale 2 puffs into the lungs daily as needed.) 10.2 g 0   Carboxymethylcellulose Sodium (THERATEARS) 0.25 % SOLN Place 2 drops into both eyes 4 (four) times daily as needed. 30 mL 0   fluticasone  (FLONASE ) 50 MCG/ACT nasal spray Place 1 spray into both nostrils daily as needed.     gabapentin  (NEURONTIN ) 400 MG capsule Take 1 capsule (400 mg total) by mouth 3 (three) times daily. 270 capsule 1   ibuprofen (ADVIL,MOTRIN) 200 MG tablet Take 200 mg by mouth as needed for moderate pain.      pantoprazole  (PROTONIX ) 40 MG tablet Take 1 tablet (40 mg total) by mouth daily. (Patient taking differently: Take 40 mg by mouth daily as needed.) 90 tablet 1   simethicone (MYLICON) 125 MG chewable  tablet Chew 125 mg by mouth every 6 (six) hours as needed.     albuterol  (VENTOLIN  HFA) 108 (90 Base) MCG/ACT inhaler Inhale 1 puff into  the lungs every 4 (four) hours as needed for up to 30 days for Wheezing or Shortness of Breath. (Patient not taking: Reported on 11/19/2024) 8.5 g 0   albuterol  (VENTOLIN  HFA) 108 (90 Base) MCG/ACT inhaler Inhale 1 puff every 4 (four) hours as needed for wheezing or shortness of breath. (Patient not taking: Reported on 11/19/2024) 6.7 g 1   benzonatate  (TESSALON ) 100 MG capsule Take 1-2 capsules (100-200 mg total) by mouth 3 (three) times a day as needed for cough. (Patient not taking: Reported on 11/19/2024) 30 capsule 0   brompheniramine-pseudoephedrine-DM 30-2-10 MG/5ML syrup Take 5 mL by mouth four (4) times a day as needed. (Patient not taking: Reported on 11/19/2024) 120 mL 0   budesonide -formoterol  (SYMBICORT ) 80-4.5 MCG/ACT inhaler Inhale 2 puffs in the morning and 2 puffs before bedtime. (Patient not taking: Reported on 11/19/2024) 10.2 g 11   Cholecalciferol (VITAMIN D3) 3000 units TABS Take 4,000 Units by mouth daily. (Patient not taking: Reported on 11/19/2024)     gabapentin  (NEURONTIN ) 400 MG capsule Take 2 capsules (800 mg total) by mouth in the morning, at lunch, and at bedtime. (Patient not taking: Reported on 11/19/2024) 540 capsule 1   gabapentin  (NEURONTIN ) 400 MG capsule Take 2 capsules (800 mg total) by mouth 3 (three) times daily. (Patient not taking: Reported on 11/19/2024) 540 capsule 1   RiTUXimab  (RITUXAN  IV) Inject 1 each into the vein every 6 (six) months.  (Patient not taking: Reported on 11/19/2024)     azithromycin  (ZITHROMAX ) 250 MG tablet Take 2 tablets by mouth on day 1 then take 1 tablet once daily opn days 2 - 5 6 tablet 0   dexamethasone  (DECADRON ) 6 MG tablet Take 1 tablet (6 mg total) by mouth in the morning with breakfast for 5 days 5 tablet 0   gabapentin  (NEURONTIN ) 400 MG capsule Take 2 caps by mouth every morning, take 1 cap daily at lunch, take 3 caps at bedtime x 1 week. THEN take 2 caps every morning, 1 cap at lunch, and 2 caps at bedtime x 14 days, THEN take 1  capsule every morning, 1 capsule daily at lunch, and 2 capsules at bedtime x 14 days, THEN take 1 capsule every morning, 1 capsule daily with lunch, and 1 capsule at bedtime x 14 days, THEN continue decreasing by 1 capsule every 14 days until discontinued. (Patient not taking: No sig reported) 210 capsule 0   predniSONE  (DELTASONE ) 20 MG tablet Take 1 tablet (20 mg total) by mouth 2 (two) times a day for 5 days. 10 tablet 0   No facility-administered medications prior to visit.     Review of Systems:   Constitutional:   No  weight loss, night sweats,  Fevers, chills, fatigue, or  lassitude.  HEENT:   No headaches,  Difficulty swallowing,  Tooth/dental problems, or  Sore throat,                No sneezing, itching, ear ache,+ nasal congestion, post nasal drip,   CV:  No chest pain,  Orthopnea, PND, swelling in lower extremities, anasarca, dizziness, palpitations, syncope.   GI  No heartburn, indigestion, abdominal pain, nausea, vomiting, diarrhea, change in bowel habits, loss of appetite, bloody stools.   Resp:  No coughing up of blood.  No change in color of mucus.  No wheezing.  No chest wall deformity  Skin: no rash or lesions.  GU: no dysuria, change in color of urine, no urgency or frequency.  No flank pain, no hematuria   MS:  No joint pain or swelling.  No decreased range of motion.  No back pain.    Physical Exam  BP 138/84   Pulse (!) 56   Temp 98.2 F (36.8 C)   Ht 5' 6 (1.676 m) Comment: Per pt  Wt 194 lb 12.8 oz (88.4 kg)   SpO2 99% Comment: RA  BMI 31.44 kg/m   GEN: A/Ox3; pleasant , NAD, well nourished    HEENT:  [place]/AT,  NOSE-clear, THROAT-clear, no lesions, no postnasal drip or exudate noted.   NECK:  Supple w/ fair ROM; no JVD; normal carotid impulses w/o bruits; no thyromegaly or nodules palpated; no lymphadenopathy.    RESP  Clear  P & A; w/o, wheezes/ rales/ or rhonchi. no accessory muscle use, no dullness to percussion  CARD:  RRR, no m/r/g, no  peripheral edema, pulses intact, no cyanosis or clubbing.  GI:   Soft & nt; nml bowel sounds; no organomegaly or masses detected.   Musco: Warm bil, no deformities or joint swelling noted.   Neuro: alert, no focal deficits noted.    Skin: Warm, no lesions or rashes    Lab Results:Reviewed 11/19/2024   CBC  BNP No results found for: BNP  ProBNP No results found for: PROBNP  Imaging: No results found.  Administration History     None          Latest Ref Rng & Units 04/18/2023    9:48 AM 10/08/2022    9:56 AM 08/27/2014    6:19 AM  PFT Results  FVC-Pre L 3.61  3.55  3.79   FVC-Predicted Pre % 76  76  96   FVC-Post L 3.67  3.68  3.97   FVC-Predicted Post % 77  79  100   Pre FEV1/FVC % % 84  85  89   Post FEV1/FCV % % 87  86  90   FEV1-Pre L 3.02  3.02  3.38   FEV1-Predicted Pre % 79  81  102   FEV1-Post L 3.19  3.18  3.59   DLCO uncorrected ml/min/mmHg 29.74  23.46  28.08   DLCO UNC% % 106  85  104   DLCO corrected ml/min/mmHg 29.74  23.46  28.16   DLCO COR %Predicted % 106  85  104   DLVA Predicted % 131  102  124   TLC L 5.64  4.91  5.67   TLC % Predicted % 90  80  92   RV % Predicted % 101  75  128     No results found for: NITRICOXIDE      No data to display              Assessment & Plan:   Assessment and Plan Assessment & Plan Sarcoidosis  -multisystem involvement appears to be stable. His sarcoidosis is well-managed with no apparent active lung involvement. Previous CT scans showed no major enlargement of lymph nodes or lung tissue involvement. Neurosarcoidosis remains stable with stable changes on MRI. A chest x-ray has been ordered to monitor for any changes,  Continue follow-up with [facility] neurology   Asthma  -mild intermittent appears to be stable His asthma is well-controlled with no recent exacerbations. A previous flare-up in the fall was managed with Symbicort  and antibiotics for bronchitis.  He is currently not on Symbicort . If  the cough does not resolve, restart Symbicort  two puffs twice a day. Symptoms will be monitored and reported via MyChart in a couple of weeks.  Cough due to postnasal drip   The cough is likely due to postnasal drip, possibly related to sarcoidosis. There is no discolored sputum, fever, or other systemic symptoms. He is currently using Flonase  and a humidifier. Add Claritin for two weeks, then as needed, and use saline nasal spray. Continue Flonase  and use Delsym for the cough. Add Pepcid  for indigestion. A chest x-ray has been ordered to rule out other causes. If symptoms do not improve, a more intensive workup will be considered.  Chronic rhinitis-mild flare.-May use Claritin at bedtime.  Flonase  as needed.  Plan  Patient Instructions  Continue on current regimen  Activity as tolerated.  Albuterol  inhaler As needed   Restart Symbicort  2 puffs Twice daily  if cough is not resolving .  Claritin 10mg  At bedtime As needed   Flonase  daily As needed   Chest xray today  Delsym 2 tsp Twice daily As needed   Pepcid  20mg  At bedtime  for 2 weeks and then As needed   Follow up with Dr. Geronimo in 1 year and As needed   Please contact office for sooner follow up if symptoms do not improve or worsen or seek emergency care        Jaimes Eckert, NP 11/19/2024      [1]  Allergies Allergen Reactions   Tape Other (See Comments)    Peels skin off  [2]  Social History Tobacco Use  Smoking Status Never  Smokeless Tobacco Never   "

## 2024-11-24 ENCOUNTER — Other Ambulatory Visit (HOSPITAL_COMMUNITY): Payer: Self-pay

## 2024-12-01 ENCOUNTER — Ambulatory Visit: Payer: Self-pay | Admitting: Adult Health

## 2024-12-04 ENCOUNTER — Other Ambulatory Visit (HOSPITAL_COMMUNITY): Payer: Self-pay

## 2024-12-06 ENCOUNTER — Other Ambulatory Visit: Payer: Self-pay

## 2024-12-24 ENCOUNTER — Ambulatory Visit: Admitting: Adult Health
